# Patient Record
Sex: Male | Born: 1985 | Race: White | Hispanic: No | Marital: Single | Smoking: Former smoker
Health system: Southern US, Community
[De-identification: ages and names within clinical notes are randomized; demographics above are authoritative.]

## PROBLEM LIST (undated history)

## (undated) DIAGNOSIS — Z9221 Personal history of antineoplastic chemotherapy: Secondary | ICD-10-CM

## (undated) DIAGNOSIS — F329 Major depressive disorder, single episode, unspecified: Secondary | ICD-10-CM

## (undated) DIAGNOSIS — Z923 Personal history of irradiation: Secondary | ICD-10-CM

## (undated) DIAGNOSIS — G40909 Epilepsy, unspecified, not intractable, without status epilepticus: Secondary | ICD-10-CM

## (undated) DIAGNOSIS — C719 Malignant neoplasm of brain, unspecified: Secondary | ICD-10-CM

## (undated) HISTORY — DX: Personal history of irradiation: Z92.3

## (undated) HISTORY — DX: Epilepsy, unspecified, not intractable, without status epilepticus: G40.909

## (undated) HISTORY — DX: Malignant neoplasm of brain, unspecified: C71.9

## (undated) HISTORY — DX: Personal history of antineoplastic chemotherapy: Z92.21

## (undated) HISTORY — DX: Major depressive disorder, single episode, unspecified: F32.9

---

## 2010-05-05 ENCOUNTER — Inpatient Hospital Stay (HOSPITAL_COMMUNITY)
Admission: EM | Admit: 2010-05-05 | Discharge: 2010-05-13 | Payer: Self-pay | Source: Home / Self Care | Attending: Internal Medicine | Admitting: Internal Medicine

## 2010-05-09 LAB — RENAL FUNCTION PANEL
Albumin: 3.4 g/dL — ABNORMAL LOW (ref 3.5–5.2)
Albumin: 3 g/dL — ABNORMAL LOW (ref 3.5–5.2)
BUN: 59 mg/dL — ABNORMAL HIGH (ref 6–23)
BUN: 57 mg/dL — ABNORMAL HIGH (ref 6–23)
CO2: 32 mEq/L (ref 19–32)
CO2: 27 mEq/L (ref 19–32)
Calcium: 7.7 mg/dL — ABNORMAL LOW (ref 8.4–10.5)
Calcium: 7.5 mg/dL — ABNORMAL LOW (ref 8.4–10.5)
Chloride: 99 mEq/L (ref 96–112)
Chloride: 100 mEq/L (ref 96–112)
Creatinine, Ser: 7.19 mg/dL — ABNORMAL HIGH (ref 0.4–1.5)
Creatinine, Ser: 6.18 mg/dL — ABNORMAL HIGH (ref 0.4–1.5)
GFR calc Af Amer: 12 mL/min — ABNORMAL LOW (ref >60)
GFR calc Af Amer: 14 mL/min — ABNORMAL LOW (ref >60)
GFR calc non Af Amer: 10 mL/min — ABNORMAL LOW (ref >60)
GFR calc non Af Amer: 11 mL/min — ABNORMAL LOW (ref >60)
Glucose, Bld: 135 mg/dL — ABNORMAL HIGH (ref 70–99)
Glucose, Bld: 137 mg/dL — ABNORMAL HIGH (ref 70–99)
Phosphorus: 6.1 mg/dL — ABNORMAL HIGH (ref 2.3–4.6)
Phosphorus: 4.9 mg/dL — ABNORMAL HIGH (ref 2.3–4.6)
Potassium: 4.4 mEq/L (ref 3.5–5.1)
Potassium: 3.6 mEq/L (ref 3.5–5.1)
Sodium: 140 mEq/L (ref 135–145)
Sodium: 139 mEq/L (ref 135–145)

## 2010-05-09 LAB — CBC
HCT: 59.6 % — ABNORMAL HIGH (ref 39.0–52.0)
HCT: 47 % (ref 39.0–52.0)
HCT: 42.4 % (ref 39.0–52.0)
HCT: 40.3 % (ref 39.0–52.0)
HCT: 40 % (ref 39.0–52.0)
HCT: 28.2 % — ABNORMAL LOW (ref 39.0–52.0)
HCT: 41.4 % (ref 39.0–52.0)
HCT: 43 % (ref 39.0–52.0)
Hemoglobin: 20 g/dL — ABNORMAL HIGH (ref 13.0–17.0)
Hemoglobin: 18.7 g/dL — ABNORMAL HIGH (ref 13.0–17.0)
Hemoglobin: 14.9 g/dL (ref 13.0–17.0)
Hemoglobin: 14.4 g/dL (ref 13.0–17.0)
Hemoglobin: 10.3 g/dL — ABNORMAL LOW (ref 13.0–17.0)
Hemoglobin: 15.4 g/dL (ref 13.0–17.0)
Hemoglobin: 15.8 g/dL (ref 13.0–17.0)
MCH: 32.9 pg (ref 26.0–34.0)
MCH: 32.1 pg (ref 26.0–34.0)
MCH: 31.4 pg (ref 26.0–34.0)
MCH: 31.8 pg (ref 26.0–34.0)
MCH: 32.1 pg (ref 26.0–34.0)
MCH: 32.1 pg (ref 26.0–34.0)
MCHC: 33.6 g/dL (ref 30.0–36.0)
MCHC: 37 g/dL — ABNORMAL HIGH (ref 30.0–36.0)
MCHC: 36 g/dL (ref 30.0–36.0)
MCHC: 36.5 g/dL — ABNORMAL HIGH (ref 30.0–36.0)
MCHC: 37 g/dL — ABNORMAL HIGH (ref 30.0–36.0)
MCHC: 36.7 g/dL — ABNORMAL HIGH (ref 30.0–36.0)
MCV: 98.2 fL (ref 78.0–100.0)
MCV: 86.9 fL (ref 78.0–100.0)
MCV: 87.3 fL (ref 78.0–100.0)
MCV: 87 fL (ref 78.0–100.0)
MCV: 86.3 fL (ref 78.0–100.0)
MCV: 87.4 fL (ref 78.0–100.0)
Platelets: 327 10*3/uL (ref 150–400)
Platelets: 153 10*3/uL (ref 150–400)
Platelets: 146 10*3/uL — ABNORMAL LOW (ref 150–400)
Platelets: 130 10*3/uL — ABNORMAL LOW (ref 150–400)
Platelets: 125 10*3/uL — ABNORMAL LOW (ref 150–400)
Platelets: 101 10*3/uL — ABNORMAL LOW (ref 150–400)
Platelets: 142 10*3/uL — ABNORMAL LOW (ref 150–400)
Platelets: 152 10*3/uL (ref 150–400)
RBC: 6.07 MIL/uL — ABNORMAL HIGH (ref 4.22–5.81)
RBC: 4.64 MIL/uL (ref 4.22–5.81)
RBC: 4.58 MIL/uL (ref 4.22–5.81)
RBC: 3.24 MIL/uL — ABNORMAL LOW (ref 4.22–5.81)
RBC: 4.8 MIL/uL (ref 4.22–5.81)
RBC: 4.92 MIL/uL (ref 4.22–5.81)
RDW: 12.3 % (ref 11.5–15.5)
RDW: 12 % (ref 11.5–15.5)
RDW: 12 % (ref 11.5–15.5)
RDW: 11.8 % (ref 11.5–15.5)
RDW: 12 % (ref 11.5–15.5)
RDW: 12.1 % (ref 11.5–15.5)
WBC: 39 10*3/uL — ABNORMAL HIGH (ref 4.0–10.5)
WBC: 33.7 10*3/uL — ABNORMAL HIGH (ref 4.0–10.5)
WBC: 18.2 10*3/uL — ABNORMAL HIGH (ref 4.0–10.5)
WBC: 16.8 10*3/uL — ABNORMAL HIGH (ref 4.0–10.5)
WBC: 15.8 10*3/uL — ABNORMAL HIGH (ref 4.0–10.5)
WBC: 13.1 10*3/uL — ABNORMAL HIGH (ref 4.0–10.5)
WBC: 19.4 10*3/uL — ABNORMAL HIGH (ref 4.0–10.5)
WBC: 18.6 10*3/uL — ABNORMAL HIGH (ref 4.0–10.5)

## 2010-05-09 LAB — BASIC METABOLIC PANEL
BUN: 17 mg/dL (ref 6–23)
BUN: 40 mg/dL — ABNORMAL HIGH (ref 6–23)
BUN: 42 mg/dL — ABNORMAL HIGH (ref 6–23)
CO2: 21 mEq/L (ref 19–32)
CO2: 20 mEq/L (ref 19–32)
CO2: 17 mEq/L — ABNORMAL LOW (ref 19–32)
Calcium: 9 mg/dL (ref 8.4–10.5)
Calcium: 8.5 mg/dL (ref 8.4–10.5)
Calcium: 8.3 mg/dL — ABNORMAL LOW (ref 8.4–10.5)
Chloride: 103 mEq/L (ref 96–112)
Chloride: 107 mEq/L (ref 96–112)
Chloride: 106 mEq/L (ref 96–112)
Creatinine, Ser: 1.83 mg/dL — ABNORMAL HIGH (ref 0.4–1.5)
Creatinine, Ser: 6.04 mg/dL — ABNORMAL HIGH (ref 0.4–1.5)
Creatinine, Ser: 6.24 mg/dL — ABNORMAL HIGH (ref 0.4–1.5)
GFR calc Af Amer: 56 mL/min — ABNORMAL LOW (ref >60)
GFR calc Af Amer: 14 mL/min — ABNORMAL LOW (ref >60)
GFR calc Af Amer: 14 mL/min — ABNORMAL LOW (ref >60)
GFR calc non Af Amer: 47 mL/min — ABNORMAL LOW (ref >60)
GFR calc non Af Amer: 12 mL/min — ABNORMAL LOW (ref >60)
GFR calc non Af Amer: 11 mL/min — ABNORMAL LOW (ref >60)
Glucose, Bld: 115 mg/dL — ABNORMAL HIGH (ref 70–99)
Glucose, Bld: 142 mg/dL — ABNORMAL HIGH (ref 70–99)
Glucose, Bld: 135 mg/dL — ABNORMAL HIGH (ref 70–99)
Potassium: 3.5 mEq/L (ref 3.5–5.1)
Potassium: 4.9 mEq/L (ref 3.5–5.1)
Potassium: 5 mEq/L (ref 3.5–5.1)
Sodium: 135 mEq/L (ref 135–145)
Sodium: 137 mEq/L (ref 135–145)
Sodium: 133 mEq/L — ABNORMAL LOW (ref 135–145)

## 2010-05-09 LAB — URINE MICROSCOPIC-ADD ON

## 2010-05-09 LAB — COMPREHENSIVE METABOLIC PANEL
ALT: 49 U/L (ref 0–53)
ALT: 44 U/L (ref 0–53)
ALT: 60 U/L — ABNORMAL HIGH (ref 0–53)
ALT: 114 U/L — ABNORMAL HIGH (ref 0–53)
AST: 66 U/L — ABNORMAL HIGH (ref 0–37)
AST: 99 U/L — ABNORMAL HIGH (ref 0–37)
AST: 179 U/L — ABNORMAL HIGH (ref 0–37)
AST: 449 U/L — ABNORMAL HIGH (ref 0–37)
Albumin: 5.2 g/dL (ref 3.5–5.2)
Albumin: 3.5 g/dL (ref 3.5–5.2)
Albumin: 3.4 g/dL — ABNORMAL LOW (ref 3.5–5.2)
Albumin: 3.2 g/dL — ABNORMAL LOW (ref 3.5–5.2)
Alkaline Phosphatase: 130 U/L — ABNORMAL HIGH (ref 39–117)
Alkaline Phosphatase: 60 U/L (ref 39–117)
Alkaline Phosphatase: 57 U/L (ref 39–117)
Alkaline Phosphatase: 55 U/L (ref 39–117)
BUN: 16 mg/dL (ref 6–23)
BUN: 43 mg/dL — ABNORMAL HIGH (ref 6–23)
BUN: 55 mg/dL — ABNORMAL HIGH (ref 6–23)
BUN: 60 mg/dL — ABNORMAL HIGH (ref 6–23)
CO2: 5 mEq/L — CL (ref 19–32)
CO2: 17 mEq/L — ABNORMAL LOW (ref 19–32)
CO2: 23 mEq/L (ref 19–32)
CO2: 25 mEq/L (ref 19–32)
Calcium: 11.5 mg/dL — ABNORMAL HIGH (ref 8.4–10.5)
Calcium: 8.1 mg/dL — ABNORMAL LOW (ref 8.4–10.5)
Calcium: 8 mg/dL — ABNORMAL LOW (ref 8.4–10.5)
Calcium: 7.4 mg/dL — ABNORMAL LOW (ref 8.4–10.5)
Chloride: 100 mEq/L (ref 96–112)
Chloride: 107 mEq/L (ref 96–112)
Chloride: 95 mEq/L — ABNORMAL LOW (ref 96–112)
Chloride: 97 mEq/L (ref 96–112)
Creatinine, Ser: 1.93 mg/dL — ABNORMAL HIGH (ref 0.4–1.5)
Creatinine, Ser: 6.11 mg/dL — ABNORMAL HIGH (ref 0.4–1.5)
Creatinine, Ser: 7.62 mg/dL — ABNORMAL HIGH (ref 0.4–1.5)
Creatinine, Ser: 7.61 mg/dL — ABNORMAL HIGH (ref 0.4–1.5)
GFR calc Af Amer: 53 mL/min — ABNORMAL LOW (ref >60)
GFR calc Af Amer: 14 mL/min — ABNORMAL LOW (ref >60)
GFR calc Af Amer: 11 mL/min — ABNORMAL LOW (ref >60)
GFR calc Af Amer: 11 mL/min — ABNORMAL LOW (ref >60)
GFR calc non Af Amer: 44 mL/min — ABNORMAL LOW (ref >60)
GFR calc non Af Amer: 12 mL/min — ABNORMAL LOW (ref >60)
GFR calc non Af Amer: 9 mL/min — ABNORMAL LOW (ref >60)
GFR calc non Af Amer: 9 mL/min — ABNORMAL LOW (ref >60)
Glucose, Bld: 190 mg/dL — ABNORMAL HIGH (ref 70–99)
Glucose, Bld: 148 mg/dL — ABNORMAL HIGH (ref 70–99)
Glucose, Bld: 144 mg/dL — ABNORMAL HIGH (ref 70–99)
Glucose, Bld: 155 mg/dL — ABNORMAL HIGH (ref 70–99)
Potassium: 4.1 mEq/L (ref 3.5–5.1)
Potassium: 4.6 mEq/L (ref 3.5–5.1)
Potassium: 3.9 mEq/L (ref 3.5–5.1)
Potassium: 3.6 mEq/L (ref 3.5–5.1)
Sodium: 146 mEq/L — ABNORMAL HIGH (ref 135–145)
Sodium: 134 mEq/L — ABNORMAL LOW (ref 135–145)
Sodium: 133 mEq/L — ABNORMAL LOW (ref 135–145)
Sodium: 137 mEq/L (ref 135–145)
Total Bilirubin: 0.6 mg/dL (ref 0.3–1.2)
Total Bilirubin: 0.8 mg/dL (ref 0.3–1.2)
Total Bilirubin: 0.8 mg/dL (ref 0.3–1.2)
Total Bilirubin: 1.1 mg/dL (ref 0.3–1.2)
Total Protein: 8.4 g/dL — ABNORMAL HIGH (ref 6.0–8.3)
Total Protein: 5.7 g/dL — ABNORMAL LOW (ref 6.0–8.3)
Total Protein: 5.5 g/dL — ABNORMAL LOW (ref 6.0–8.3)
Total Protein: 5.5 g/dL — ABNORMAL LOW (ref 6.0–8.3)

## 2010-05-09 LAB — URINALYSIS, ROUTINE W REFLEX MICROSCOPIC
Bilirubin Urine: NEGATIVE
Bilirubin Urine: NEGATIVE
Ketones, ur: NEGATIVE mg/dL
Ketones, ur: NEGATIVE mg/dL
Leukocytes, UA: NEGATIVE
Leukocytes, UA: NEGATIVE
Nitrite: NEGATIVE
Nitrite: NEGATIVE
Protein, ur: 100 mg/dL — AB
Protein, ur: 100 mg/dL — AB
Specific Gravity, Urine: 1.018 (ref 1.005–1.030)
Specific Gravity, Urine: 1.011 (ref 1.005–1.030)
Urine Glucose, Fasting: NEGATIVE mg/dL
Urine Glucose, Fasting: NEGATIVE mg/dL
Urobilinogen, UA: 0.2 mg/dL (ref 0.0–1.0)
Urobilinogen, UA: 0.2 mg/dL (ref 0.0–1.0)
pH: 5.5 (ref 5.0–8.0)
pH: 5.5 (ref 5.0–8.0)

## 2010-05-09 LAB — PROTEIN / CREATININE RATIO, URINE
Creatinine, Urine: 67.5 mg/dL
Protein Creatinine Ratio: 0.9 — ABNORMAL HIGH (ref 0.00–0.15)
Total Protein, Urine: 61 mg/dL

## 2010-05-09 LAB — CK
Total CK: 10104 U/L — ABNORMAL HIGH (ref 7–232)
Total CK: 30317 U/L — ABNORMAL HIGH (ref 7–232)
Total CK: 92674 U/L — ABNORMAL HIGH (ref 7–232)
Total CK: >50000 U/L — ABNORMAL HIGH (ref 7–232)
Total CK: >50000 U/L — ABNORMAL HIGH (ref 7–232)

## 2010-05-09 LAB — DIFFERENTIAL
Band Neutrophils: 0 % (ref 0–10)
Basophils Absolute: 0 10*3/uL (ref 0.0–0.1)
Basophils Absolute: 0 10*3/uL (ref 0.0–0.1)
Basophils Relative: 0 % (ref 0–1)
Basophils Relative: 0 % (ref 0–1)
Blasts: 0 %
Eosinophils Absolute: 0 10*3/uL (ref 0.0–0.7)
Eosinophils Absolute: 0 10*3/uL (ref 0.0–0.7)
Eosinophils Relative: 0 % (ref 0–5)
Eosinophils Relative: 0 % (ref 0–5)
Lymphocytes Relative: 4 % — ABNORMAL LOW (ref 12–46)
Lymphocytes Relative: 3 % — ABNORMAL LOW (ref 12–46)
Lymphs Abs: 1.3 10*3/uL (ref 0.7–4.0)
Lymphs Abs: 0.5 10*3/uL — ABNORMAL LOW (ref 0.7–4.0)
Metamyelocytes Relative: 0 %
Monocytes Absolute: 1 10*3/uL (ref 0.1–1.0)
Monocytes Absolute: 0.5 10*3/uL (ref 0.1–1.0)
Monocytes Relative: 3 % (ref 3–12)
Monocytes Relative: 3 % (ref 3–12)
Myelocytes: 0 %
Neutro Abs: 31.4 10*3/uL — ABNORMAL HIGH (ref 1.7–7.7)
Neutro Abs: 17.2 10*3/uL — ABNORMAL HIGH (ref 1.7–7.7)
Neutrophils Relative %: 93 % — ABNORMAL HIGH (ref 43–77)
Neutrophils Relative %: 94 % — ABNORMAL HIGH (ref 43–77)
Promyelocytes Absolute: 0 %
nRBC: 0 /100 WBC

## 2010-05-09 LAB — DIRECT ANTIGLOBULIN TEST (NOT AT ARMC)
DAT, IgG: NEGATIVE
DAT, complement: NEGATIVE

## 2010-05-09 LAB — HEPATIC FUNCTION PANEL
ALT: 229 U/L — ABNORMAL HIGH (ref 0–53)
AST: 757 U/L — ABNORMAL HIGH (ref 0–37)
Albumin: 3 g/dL — ABNORMAL LOW (ref 3.5–5.2)
Alkaline Phosphatase: 53 U/L (ref 39–117)
Bilirubin, Direct: 0.2 mg/dL (ref 0.0–0.3)
Indirect Bilirubin: 0.7 mg/dL (ref 0.3–0.9)
Total Bilirubin: 0.9 mg/dL (ref 0.3–1.2)
Total Protein: 5.1 g/dL — ABNORMAL LOW (ref 6.0–8.3)

## 2010-05-09 LAB — HIGH SENSITIVITY CRP: CRP, High Sensitivity: 98 mg/L — ABNORMAL HIGH

## 2010-05-09 LAB — URINE CULTURE
Colony Count: NO GROWTH
Culture  Setup Time: 201201120648
Culture: NO GROWTH

## 2010-05-09 LAB — SODIUM, URINE, RANDOM: Sodium, Ur: 17 mEq/L

## 2010-05-09 LAB — HIV ANTIBODY (ROUTINE TESTING W REFLEX): HIV: NONREACTIVE

## 2010-05-09 LAB — MAGNESIUM
Magnesium: 3.8 mg/dL — ABNORMAL HIGH (ref 1.5–2.5)
Magnesium: 2.9 mg/dL — ABNORMAL HIGH (ref 1.5–2.5)

## 2010-05-09 LAB — LACTATE DEHYDROGENASE: LDH: 1584 U/L — ABNORMAL HIGH (ref 94–250)

## 2010-05-09 LAB — SEDIMENTATION RATE: Sed Rate: 4 mm/hr (ref 0–16)

## 2010-05-09 LAB — RAPID URINE DRUG SCREEN, HOSP PERFORMED
Amphetamines: NOT DETECTED
Barbiturates: NOT DETECTED
Benzodiazepines: NOT DETECTED
Cocaine: NOT DETECTED
Opiates: NOT DETECTED
Tetrahydrocannabinol: POSITIVE — AB

## 2010-05-09 LAB — GLUCOSE, CAPILLARY: Glucose-Capillary: 213 mg/dL — ABNORMAL HIGH (ref 70–99)

## 2010-05-09 LAB — PHOSPHORUS
Phosphorus: 7.3 mg/dL — ABNORMAL HIGH (ref 2.3–4.6)
Phosphorus: 7.5 mg/dL — ABNORMAL HIGH (ref 2.3–4.6)
Phosphorus: 6.4 mg/dL — ABNORMAL HIGH (ref 2.3–4.6)

## 2010-05-09 LAB — MRSA PCR SCREENING: MRSA by PCR: NEGATIVE

## 2010-05-09 LAB — RETICULOCYTES
RBC.: 4.42 MIL/uL (ref 4.22–5.81)
Retic Count, Absolute: 30.9 10*3/uL (ref 19.0–186.0)
Retic Ct Pct: 0.7 % (ref 0.4–3.1)

## 2010-05-09 LAB — HAPTOGLOBIN: Haptoglobin: 114 mg/dL (ref 16–200)

## 2010-05-09 LAB — CREATININE, URINE, RANDOM: Creatinine, Urine: 67.3 mg/dL

## 2010-05-09 LAB — ETHANOL: Alcohol, Ethyl (B): <5 mg/dL (ref 0–10)

## 2010-05-09 LAB — PROTEIN, URINE, RANDOM: Total Protein, Urine: 60 mg/dL

## 2010-05-10 ENCOUNTER — Encounter (INDEPENDENT_AMBULATORY_CARE_PROVIDER_SITE_OTHER): Payer: Self-pay | Admitting: Internal Medicine

## 2010-05-11 LAB — COMPREHENSIVE METABOLIC PANEL
ALT: 242 U/L — ABNORMAL HIGH (ref 0–53)
ALT: 235 U/L — ABNORMAL HIGH (ref 0–53)
AST: 700 U/L — ABNORMAL HIGH (ref 0–37)
AST: 578 U/L — ABNORMAL HIGH (ref 0–37)
Albumin: 2.9 g/dL — ABNORMAL LOW (ref 3.5–5.2)
Albumin: 2.7 g/dL — ABNORMAL LOW (ref 3.5–5.2)
Alkaline Phosphatase: 54 U/L (ref 39–117)
Alkaline Phosphatase: 44 U/L (ref 39–117)
BUN: 51 mg/dL — ABNORMAL HIGH (ref 6–23)
BUN: 40 mg/dL — ABNORMAL HIGH (ref 6–23)
CO2: 24 mEq/L (ref 19–32)
CO2: 26 mEq/L (ref 19–32)
Calcium: 7.6 mg/dL — ABNORMAL LOW (ref 8.4–10.5)
Calcium: 8.2 mg/dL — ABNORMAL LOW (ref 8.4–10.5)
Chloride: 103 mEq/L (ref 96–112)
Chloride: 108 mEq/L (ref 96–112)
Creatinine, Ser: 5.23 mg/dL — ABNORMAL HIGH (ref 0.4–1.5)
Creatinine, Ser: 3.6 mg/dL — ABNORMAL HIGH (ref 0.4–1.5)
GFR calc Af Amer: 17 mL/min — ABNORMAL LOW (ref >60)
GFR calc Af Amer: 26 mL/min — ABNORMAL LOW (ref >60)
GFR calc non Af Amer: 14 mL/min — ABNORMAL LOW (ref >60)
GFR calc non Af Amer: 21 mL/min — ABNORMAL LOW (ref >60)
Glucose, Bld: 107 mg/dL — ABNORMAL HIGH (ref 70–99)
Glucose, Bld: 103 mg/dL — ABNORMAL HIGH (ref 70–99)
Potassium: 4 mEq/L (ref 3.5–5.1)
Potassium: 3.6 mEq/L (ref 3.5–5.1)
Sodium: 136 mEq/L (ref 135–145)
Sodium: 142 mEq/L (ref 135–145)
Total Bilirubin: 1 mg/dL (ref 0.3–1.2)
Total Bilirubin: 1.2 mg/dL (ref 0.3–1.2)
Total Protein: 4.8 g/dL — ABNORMAL LOW (ref 6.0–8.3)
Total Protein: 4.6 g/dL — ABNORMAL LOW (ref 6.0–8.3)

## 2010-05-11 LAB — CK
Total CK: >50000 U/L — ABNORMAL HIGH (ref 7–232)
Total CK: 51091 U/L — ABNORMAL HIGH (ref 7–232)
Total CK: >50000 U/L — ABNORMAL HIGH (ref 7–232)

## 2010-05-11 LAB — BASIC METABOLIC PANEL
BUN: 36 mg/dL — ABNORMAL HIGH (ref 6–23)
CO2: 23 mEq/L (ref 19–32)
Calcium: 8.5 mg/dL (ref 8.4–10.5)
Chloride: 108 mEq/L (ref 96–112)
Creatinine, Ser: 3.18 mg/dL — ABNORMAL HIGH (ref 0.4–1.5)
GFR calc Af Amer: 30 mL/min — ABNORMAL LOW (ref >60)
GFR calc non Af Amer: 25 mL/min — ABNORMAL LOW (ref >60)
Glucose, Bld: 123 mg/dL — ABNORMAL HIGH (ref 70–99)
Potassium: 3.4 mEq/L — ABNORMAL LOW (ref 3.5–5.1)
Sodium: 141 mEq/L (ref 135–145)

## 2010-05-11 LAB — CBC
HCT: 41.7 % (ref 39.0–52.0)
HCT: 40 % (ref 39.0–52.0)
Hemoglobin: 15.2 g/dL (ref 13.0–17.0)
Hemoglobin: 14.5 g/dL (ref 13.0–17.0)
MCH: 31.9 pg (ref 26.0–34.0)
MCH: 31.4 pg (ref 26.0–34.0)
MCHC: 36.5 g/dL — ABNORMAL HIGH (ref 30.0–36.0)
MCHC: 36.3 g/dL — ABNORMAL HIGH (ref 30.0–36.0)
MCV: 87.4 fL (ref 78.0–100.0)
MCV: 86.6 fL (ref 78.0–100.0)
Platelets: 155 10*3/uL (ref 150–400)
Platelets: 142 10*3/uL — ABNORMAL LOW (ref 150–400)
RBC: 4.77 MIL/uL (ref 4.22–5.81)
RBC: 4.62 MIL/uL (ref 4.22–5.81)
RDW: 11.9 % (ref 11.5–15.5)
RDW: 11.8 % (ref 11.5–15.5)
WBC: 15.2 10*3/uL — ABNORMAL HIGH (ref 4.0–10.5)
WBC: 15 10*3/uL — ABNORMAL HIGH (ref 4.0–10.5)

## 2010-05-11 LAB — GIARDIA/CRYPTOSPORIDIUM SCREEN(EIA)
Cryptosporidium Screen (EIA): NEGATIVE
Giardia Screen - EIA: NEGATIVE

## 2010-05-11 LAB — PHOSPHORUS
Phosphorus: 4.6 mg/dL (ref 2.3–4.6)
Phosphorus: 3.9 mg/dL (ref 2.3–4.6)
Phosphorus: 3.6 mg/dL (ref 2.3–4.6)

## 2010-05-11 LAB — HEPATITIS B SURFACE ANTIBODY,QUALITATIVE: Hep B S Ab: POSITIVE — AB

## 2010-05-11 LAB — LEUKOCYTE ALKALINE PHOSPHATASE: Leukocyte Alkaline  Phos Stain: 233 — ABNORMAL HIGH (ref 30–140)

## 2010-05-12 ENCOUNTER — Encounter (INDEPENDENT_AMBULATORY_CARE_PROVIDER_SITE_OTHER): Payer: Self-pay | Admitting: Internal Medicine

## 2010-05-12 HISTORY — PX: CRANIOTOMY FOR STEREOTACTIC GUIDED SURGERY: SUR339

## 2010-05-16 LAB — CBC
HCT: 39.4 % (ref 39.0–52.0)
HCT: 40.2 % (ref 39.0–52.0)
HCT: 38.2 % — ABNORMAL LOW (ref 39.0–52.0)
Hemoglobin: 14 g/dL (ref 13.0–17.0)
Hemoglobin: 14.5 g/dL (ref 13.0–17.0)
Hemoglobin: 13.6 g/dL (ref 13.0–17.0)
MCH: 31.1 pg (ref 26.0–34.0)
MCH: 31.5 pg (ref 26.0–34.0)
MCH: 31.3 pg (ref 26.0–34.0)
MCHC: 35.5 g/dL (ref 30.0–36.0)
MCHC: 36.1 g/dL — ABNORMAL HIGH (ref 30.0–36.0)
MCHC: 35.6 g/dL (ref 30.0–36.0)
MCV: 87.6 fL (ref 78.0–100.0)
MCV: 87.2 fL (ref 78.0–100.0)
MCV: 88 fL (ref 78.0–100.0)
Platelets: 126 10*3/uL — ABNORMAL LOW (ref 150–400)
Platelets: 124 10*3/uL — ABNORMAL LOW (ref 150–400)
Platelets: 135 10*3/uL — ABNORMAL LOW (ref 150–400)
RBC: 4.5 MIL/uL (ref 4.22–5.81)
RBC: 4.61 MIL/uL (ref 4.22–5.81)
RBC: 4.34 MIL/uL (ref 4.22–5.81)
RDW: 11.8 % (ref 11.5–15.5)
RDW: 11.8 % (ref 11.5–15.5)
RDW: 11.8 % (ref 11.5–15.5)
WBC: 14.6 10*3/uL — ABNORMAL HIGH (ref 4.0–10.5)
WBC: 16.1 10*3/uL — ABNORMAL HIGH (ref 4.0–10.5)
WBC: 17.3 10*3/uL — ABNORMAL HIGH (ref 4.0–10.5)

## 2010-05-16 LAB — COMPREHENSIVE METABOLIC PANEL
ALT: 265 U/L — ABNORMAL HIGH (ref 0–53)
ALT: 425 U/L — ABNORMAL HIGH (ref 0–53)
AST: 416 U/L — ABNORMAL HIGH (ref 0–37)
AST: 357 U/L — ABNORMAL HIGH (ref 0–37)
Albumin: 2.7 g/dL — ABNORMAL LOW (ref 3.5–5.2)
Albumin: 3 g/dL — ABNORMAL LOW (ref 3.5–5.2)
Alkaline Phosphatase: 39 U/L (ref 39–117)
Alkaline Phosphatase: 51 U/L (ref 39–117)
BUN: 33 mg/dL — ABNORMAL HIGH (ref 6–23)
BUN: 27 mg/dL — ABNORMAL HIGH (ref 6–23)
CO2: 25 mEq/L (ref 19–32)
CO2: 27 mEq/L (ref 19–32)
Calcium: 8.2 mg/dL — ABNORMAL LOW (ref 8.4–10.5)
Calcium: 8.7 mg/dL (ref 8.4–10.5)
Chloride: 108 mEq/L (ref 96–112)
Chloride: 107 mEq/L (ref 96–112)
Creatinine, Ser: 2.87 mg/dL — ABNORMAL HIGH (ref 0.4–1.5)
Creatinine, Ser: 2.27 mg/dL — ABNORMAL HIGH (ref 0.4–1.5)
GFR calc Af Amer: 33 mL/min — ABNORMAL LOW (ref >60)
GFR calc Af Amer: 44 mL/min — ABNORMAL LOW (ref >60)
GFR calc non Af Amer: 28 mL/min — ABNORMAL LOW (ref >60)
GFR calc non Af Amer: 36 mL/min — ABNORMAL LOW (ref >60)
Glucose, Bld: 113 mg/dL — ABNORMAL HIGH (ref 70–99)
Glucose, Bld: 117 mg/dL — ABNORMAL HIGH (ref 70–99)
Potassium: 4.4 mEq/L (ref 3.5–5.1)
Potassium: 3.7 mEq/L (ref 3.5–5.1)
Sodium: 140 mEq/L (ref 135–145)
Sodium: 140 mEq/L (ref 135–145)
Total Bilirubin: 0.7 mg/dL (ref 0.3–1.2)
Total Bilirubin: 1 mg/dL (ref 0.3–1.2)
Total Protein: 4.6 g/dL — ABNORMAL LOW (ref 6.0–8.3)
Total Protein: 5.4 g/dL — ABNORMAL LOW (ref 6.0–8.3)

## 2010-05-16 LAB — GRAM STAIN

## 2010-05-16 LAB — CULTURE, BLOOD (ROUTINE X 2)
Culture  Setup Time: 201201121034
Culture: NO GROWTH

## 2010-05-16 LAB — PROTIME-INR
INR: 1.07 (ref 0.00–1.49)
Prothrombin Time: 14.1 seconds (ref 11.6–15.2)

## 2010-05-16 LAB — HEPATITIS B CORE ANTIBODY, TOTAL: Hep B Core Total Ab: NEGATIVE

## 2010-05-16 LAB — RENAL FUNCTION PANEL
Albumin: 2.8 g/dL — ABNORMAL LOW (ref 3.5–5.2)
BUN: 26 mg/dL — ABNORMAL HIGH (ref 6–23)
CO2: 26 mEq/L (ref 19–32)
Calcium: 8.3 mg/dL — ABNORMAL LOW (ref 8.4–10.5)
Chloride: 108 mEq/L (ref 96–112)
Creatinine, Ser: 1.98 mg/dL — ABNORMAL HIGH (ref 0.4–1.5)
GFR calc Af Amer: 51 mL/min — ABNORMAL LOW (ref >60)
GFR calc non Af Amer: 42 mL/min — ABNORMAL LOW (ref >60)
Glucose, Bld: 166 mg/dL — ABNORMAL HIGH (ref 70–99)
Phosphorus: 3.1 mg/dL (ref 2.3–4.6)
Potassium: 4.2 mEq/L (ref 3.5–5.1)
Sodium: 139 mEq/L (ref 135–145)

## 2010-05-16 LAB — CK: Total CK: 21579 U/L — ABNORMAL HIGH (ref 7–232)

## 2010-05-16 LAB — TYPE AND SCREEN
ABO/RH(D): A POS
Antibody Screen: NEGATIVE

## 2010-05-16 LAB — MAGNESIUM: Magnesium: 1.7 mg/dL (ref 1.5–2.5)

## 2010-05-16 LAB — HEPATITIS PANEL, ACUTE
HCV Ab: NEGATIVE
Hep A IgM: NEGATIVE
Hep B C IgM: NEGATIVE
Hepatitis B Surface Ag: NEGATIVE

## 2010-05-16 LAB — APTT: aPTT: 28 seconds (ref 24–37)

## 2010-05-16 LAB — MRSA PCR SCREENING: MRSA by PCR: NEGATIVE

## 2010-05-16 LAB — ABO/RH: ABO/RH(D): A POS

## 2010-05-17 LAB — TISSUE CULTURE: Culture: NO GROWTH

## 2010-05-20 NOTE — H&P (Signed)
NAMEJESSI, JESSOP                 ACCOUNT NO.:  [id number]  MEDICAL RECORD NO.:  [id number]          PATIENT TYPE:  EMS  LOCATION:  MAJO                         FACILITY:  [facility]  PHYSICIAN:  Houston Siren, MD           DATE OF BIRTH:  [birth year]  DATE OF ADMISSION:  05/05/2010 DATE OF DISCHARGE:                             HISTORY & PHYSICAL   PRIMARY CARE PHYSICIAN:  Unassigned.  REASON FOR ADMISSION:  New onset of seizure.  ADVANCE DIRECTIVE:  Full code.  HISTORY OF PRESENT ILLNESS:  This is a 25 year old male with benign past medical history on no chronic medication, presents to the emergency room with three episodes of brief but generalized tonic-clonic seizure.  He had bladder incontinence with this and had postictal confusion.  He was loaded with fosphenytoin and had no further seizure.  When I saw him, he was alert and oriented, able to carry on meaningful conversation.  He had mild headache but no visual problem.  He denies any fever, chills or any pulmonary symptoms.  He has no stiff neck, nausea or vomiting, sore throat or any other symptomatology.  Workup included a head CT with no contrast demonstrating a round hypodense lesion in the occipital lobe. Urine drug screen only show THC.  He is afebrile, but did have a white count of 39,000.  Pertinent history is that last summer, he traveled to [place] and stayed for 10 days and ate local food there.  He has no family history of brain tumor or seizure.  Hospitalist was asked to admit the patient for further evaluation of this new onset of seizure.  PAST MEDICAL HISTORY:  Benign.  MEDICATIONS:  On no chronic meds.  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  He is not married and has no children.  He has no increased risk for HIV.  He used marijuana but denied any drug use.  He is not a smoker.  Denies any excessive alcohol use.  FAMILY HISTORY:  The patient has brothers and sisters, none of whom  had seizure or brain tumor.  REVIEW OF SYSTEMS:  Otherwise unremarkable.  PHYSICAL EXAMINATION:  VITAL SIGNS:  Blood pressure 140/60, pulse of 120, respiratory rate of 18, temperature 97.7. GENERAL:  He is alert and oriented and is in no apparent distress. HEENT:  His extraocular muscles are intact.  Pupils are round, reactive to light and accommodation.  His visual field is full by confrontation. He has facial symmetry and his speech is fluent.  Tongue is midline. Uvula elevated with phonation. NECK:  Supple.  Throat is clear. CARDIAC:  S1 and S2 regular.  I did not hear any murmur, rub or gallop. LUNGS:  Clear.  He had no cervical adenopathy or supraclavicular adenopathy. ABDOMEN:  Soft, nondistended, nontender. EXTREMITIES:  No edema.  There is no rash or joint pain.  Babinski is down bilaterally.  His strength is equal bilaterally as well. Cerebellar exam is intact. SKIN:  Warm and dry. PSYCHIATRIC:  Unremarkable as well.  OBJECTIVE FINDINGS:  CT of the head without contrast  show occipital mass versus infarction.  Suggested MRI.  Alcohol level less than 5.  White count of 39,000 with hemoglobin of 20.  His serum sodium is 146, creatinine of 1.93, BUN of 16.  Chest x-ray is clear.  IMPRESSION:  This is a 25 year old male previously healthy presenting with three episodes of seizure and head CT without contrast showed round hypodense in the occipital lobe.  He also has elevated white count to 39,000 and hemoglobin of 20.  He has elevated creatinine as well.  The differential here includes brain abscess, cysticercosis, lymphoma, glioma and even tuberculosis.  We will admit him to the telemetry unit and will continue the Dilantin 100 mg p.o. t.i.d.  We will obtain MRI of the brain with and without contrast.  I would like to repeat the CBCs to confirm that he has leukocytosis and elevated hemoglobin.  Neurology has already been consulted and will likely need neurological consult  as well.  Please call neurosurgical consult after MRI of the brain is done. He is quite stable now, and we will watch him closely.  W'll give some IV fluids to see if his elevated creatinine would improve.  He is a full code, will be admitted to triad hospitalist.     Houston Siren, MD     PL/MEDQ  D:  05/05/2010  T:  05/05/2010  Job:  [id number]  Electronically Signed by Houston Siren  on 05/18/2010 12:50:27 AM

## 2010-05-26 DIAGNOSIS — C714 Malignant neoplasm of occipital lobe: Secondary | ICD-10-CM | POA: Insufficient documentation

## 2010-05-26 HISTORY — PX: CRANIECTOMY / CRANIOTOMY FOR EXCISION OF BRAIN TUMOR: SUR320

## 2010-06-01 NOTE — Discharge Summary (Signed)
  [name], Robert Watkins                 ACCOUNT NO.:  [id number]  MEDICAL RECORD NO.:  [id number]          PATIENT TYPE:  INP  LOCATION:  3108                         FACILITY:  [facility]  PHYSICIAN:  Jeoffrey Massed, MD    DATE OF BIRTH:  11-27-1985  DATE OF ADMISSION:  05/05/2010 DATE OF DISCHARGE:  05/13/2010                        DISCHARGE SUMMARY - REFERRING   ADDENDUM: The patient's discharge medications are as follows: 1. Amlodipine 2.5 mg p.o. daily. 2. Decadron taper as prescribed by Dr. Danielle Dess. 3. Keppra 500 mg one tablet p.o. b.i.d.  I did have a chance discuss with the patient's current neurosurgeon, Dr. Danielle Dess.  He will follow the pathology results and will then call the patient for a follow-up appointment.  As indicated before, the patient's father will try and schedule this patient to see Dr. Danise Edge in the next few weeks or so.  For further details, please see the discharge summary that was dictated by me before.   Jeoffrey Massed, MD     SG/MEDQ  D:  05/13/2010  T:  05/13/2010  Job:  [id number]  cc:   Stefani Dama, M.D. Danise Edge, Dr.  Electronically Signed by Jeoffrey Massed  on 06/01/2010 04:09:08 PM

## 2010-06-01 NOTE — Discharge Summary (Signed)
Robert Watkins, Robert Watkins                 ACCOUNT NO.:  [id number]  MEDICAL RECORD NO.:  [id number]          PATIENT TYPE:  INP  LOCATION:  3108                         FACILITY:  [facility]  PHYSICIAN:  Jeoffrey Massed, MD    DATE OF BIRTH:  16-Aug-1985  DATE OF ADMISSION:  05/05/2010 DATE OF DISCHARGE:                        DISCHARGE SUMMARY - REFERRING   PRIMARY DISCHARGE DIAGNOSES: 1. Left occipital region mass.  Biopsies currently pending. 2. Generalized tonic clonic seizures, probably secondary to above. 3. Leukocytosis of uncertain etiology, now resolving. 4. Acute renal failure, resolving. 5. Rhabdomyolysis secondary to seizures. 6. Elevated blood pressure without a diagnosis of hypertension. 7. Left upper extremity hematoma, resolving. 8. Thrombocytopenia, resolving.  CONSULTANTS: 1. Dr. Danielle Dess from neurosurgery. 2. Dr. Caryn Section from [facility]. 3. [facility].  BRIEF HISTORY OF PRESENT ILLNESS:  The patient is a very pleasant 25- year-old man with no significant past medical history who presented to the emergency room on October 04, 2010 with new onset,  3 episodes of generalized tonic clonic seizure.  He was found to have a white count of 39,000.  A workup in the ED had demonstrated a round hypodense lesion in the left occipital lobe seen on the CAT scan of the head.  He was then admitted to the hospitalist service for further evaluation and treatment.  For further details, please see the history and physical that was dictated by Dr. Conley Rolls on admission.  PERTINENT LABORATORY DATA: 1. The preliminary culture of the brain tissue shows moderate WBC with     both PMN and mononuclear cells with no organisms seen so far. 2. Admitting creatinine was 1.93 with a bicarb of 5. 3. The peak CPK was 92,674.  His peak creatinine was 7.19. 4. Last creatinine done early this morning was 1.98. 5. Acute hepatitis serology was negative. 6. Leukocyte alkaline phosphatase stain was 233. 7.  ESR was 4. 8. HIV antibody was nonreactive.  RADIOLOGICAL STUDIES: 1. X-ray of the chest done on May 05, 2010 showed no acute     cardiopulmonary findings. 2. CT of the head without contrast showed occipital mass with     infarction. 3. MRI of the brain with and without contrast showed a 3.5-cm x 4.5-cm     mass in the left occipital lobe with restricted effusion and     minimal central enhancement.  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  A cerebral abscess is less likely a     consideration. 4. Renal ultrasound showed normal sonographic appearance of both     kidneys. 5. MRI of the head with stealth protocol done on 17th [name] 1012     showed enlargement of enlargement of the region of signal     disturbance in the left parieto-occipital region with enlargement     of the central necrotic/liquified area.  I think the changes favor     inflammatory disease, though rapidly progressive neoplastic disease     is also considered.  PROCEDURES PERFORMED:  The patient underwent a left occipital frameless stereotactic brain biopsy on May 12, 2009.  BRIEF HOSPITAL  COURSE: 1. Seizure disorder.  This is secondary to the brain mass.  The     patient was seen in consult with both Neurology and Neurosurgery.     Currently the patient has been maintained on Keppra with good     results.  He was also started on Decadron which is gradually being     tapered. 2. Left occipital mass.  This is of unknown etiology.  Although the     initial MRI of the brain was suggestive of a low-grade glioma, a     repeat MRI of the brain with stealth protocol is now suggestive of     a possible inflammatory process given the rapid enlargement of the     mass.  The patient did undergo a left occipital frameless     stereotactic brain biopsy yesterday, the pathology of which is     still pending.  The Gram stain is showing moderate WBCs with both     polymorphonuclear cells and  mononuclear organisms; however, no     organisms were seen.  The patient has remained persistently     afebrile.  He is currently not on any antibiotics.  Further plan     will depend upon the results of the biopsy.  Cultures of the     biopsies are also pending and will need followup as well. 3. Acute renal failure.  This is secondary to rhabdomyolysis.  The     etiology of the rhabdomyolysis was secondary to seizures.  Peak     creatinine at one point was more than 7.  The patient was seen in     consult with Nephrology and was hydrated aggressively and even a     bicarbonate infusion was started.  His CPK has trended down slowly.     With hydration and with control of his seizure activity, his     creatinine has also started to come down and last creatinine done     early this morning is 1.98.  It is anticipated further decrease in     his creatinine and subsequent normalizations will occur in the next     few days. 4. Elevated blood pressure without a diagnosis of hypertension.  The     elevated blood pressure was probably a result of IV fluids and     renal failure.  Initially he was both on hydralazine and     amlodipine; however, the hydralazine has now been discontinued.     Amlodipine has now been decreased to 2.5 mg daily.  He will need     follow up with a primary care practitioner for further optimization     and perhaps even discontinuation of all of his antihypertensive     medications once his kidney function resolves.  DISCHARGE MEDICATIONS:  Discharge medications will be dictated at the time of discharge by the discharging physician.  DISPOSITION:  The patient will be discharged home when medically stable.  FOLLOWUP INSTRUCTIONS: 1. The patient's pathology from the brain biopsy is currently pending     and will need followup. 2. Brain tissue from his brain biopsy have also been sent for Gram     stain.  Culture of his tissue along with fungal and AFB are also      pending and will need to be followed up as well. 3. I have talked in detail with the patient's father at bedside.  He     plans to make an appointment  with Dr. Danise Edge who is the     father's primary care practitioner in the next week or so. 4. The patient will require followup with neurosurgical service as     advised by Neurosurgery upon discharge.     Jeoffrey Massed, MD     SG/MEDQ  D:  05/13/2010  T:  05/13/2010  Job:  [id number]  cc:   Danise Edge, M.D. Stefani Dama, M.D.  Electronically Signed by Jeoffrey Massed  on 06/01/2010 04:08:53 PM

## 2010-06-09 ENCOUNTER — Ambulatory Visit: Payer: BC Managed Care – PPO | Attending: Radiation Oncology | Admitting: Radiation Oncology

## 2010-06-09 DIAGNOSIS — Z510 Encounter for antineoplastic radiation therapy: Secondary | ICD-10-CM | POA: Insufficient documentation

## 2010-06-09 DIAGNOSIS — C714 Malignant neoplasm of occipital lobe: Secondary | ICD-10-CM | POA: Insufficient documentation

## 2010-06-09 DIAGNOSIS — Z79899 Other long term (current) drug therapy: Secondary | ICD-10-CM | POA: Insufficient documentation

## 2010-06-10 ENCOUNTER — Other Ambulatory Visit: Payer: Self-pay | Admitting: Oncology

## 2010-06-10 ENCOUNTER — Encounter (HOSPITAL_BASED_OUTPATIENT_CLINIC_OR_DEPARTMENT_OTHER): Payer: BC Managed Care – PPO | Admitting: Oncology

## 2010-06-10 ENCOUNTER — Encounter: Payer: BC Managed Care – PPO | Admitting: Oncology

## 2010-06-10 DIAGNOSIS — C713 Malignant neoplasm of parietal lobe: Secondary | ICD-10-CM

## 2010-06-10 LAB — FUNGUS CULTURE W SMEAR: Fungal Smear: NONE SEEN

## 2010-06-10 LAB — COMPREHENSIVE METABOLIC PANEL
ALT: 72 U/L — ABNORMAL HIGH (ref 0–53)
AST: 21 U/L (ref 0–37)
Albumin: 3.8 g/dL (ref 3.5–5.2)
Alkaline Phosphatase: 58 U/L (ref 39–117)
BUN: 22 mg/dL (ref 6–23)
CO2: 22 mEq/L (ref 19–32)
Calcium: 8.3 mg/dL — ABNORMAL LOW (ref 8.4–10.5)
Chloride: 105 mEq/L (ref 96–112)
Creatinine, Ser: 0.73 mg/dL (ref 0.40–1.50)
Glucose, Bld: 94 mg/dL (ref 70–99)
Potassium: 4 mEq/L (ref 3.5–5.3)
Sodium: 138 mEq/L (ref 135–145)
Total Bilirubin: 0.5 mg/dL (ref 0.3–1.2)
Total Protein: 5.6 g/dL — ABNORMAL LOW (ref 6.0–8.3)

## 2010-06-10 LAB — CBC WITH DIFFERENTIAL/PLATELET
BASO%: 0 % (ref 0.0–2.0)
Basophils Absolute: 0 10*3/uL (ref 0.0–0.1)
EOS%: 0.3 % (ref 0.0–7.0)
Eosinophils Absolute: 0 10*3/uL (ref 0.0–0.5)
HCT: 37.2 % — ABNORMAL LOW (ref 38.4–49.9)
HGB: 13.1 g/dL (ref 13.0–17.1)
LYMPH%: 5 % — ABNORMAL LOW (ref 14.0–49.0)
MCH: 32.4 pg (ref 27.2–33.4)
MCHC: 35.1 g/dL (ref 32.0–36.0)
MCV: 92.4 fL (ref 79.3–98.0)
MONO#: 0.5 10*3/uL (ref 0.1–0.9)
MONO%: 2.6 % (ref 0.0–14.0)
NEUT#: 16.3 10*3/uL — ABNORMAL HIGH (ref 1.5–6.5)
NEUT%: 92.1 % — ABNORMAL HIGH (ref 39.0–75.0)
Platelets: 155 10*3/uL (ref 140–400)
RBC: 4.02 10*6/uL — ABNORMAL LOW (ref 4.20–5.82)
RDW: 13.5 % (ref 11.0–14.6)
WBC: 17.6 10*3/uL — ABNORMAL HIGH (ref 4.0–10.3)
lymph#: 0.9 10*3/uL (ref 0.9–3.3)

## 2010-06-10 LAB — TECHNOLOGIST REVIEW

## 2010-06-10 LAB — LACTATE DEHYDROGENASE: LDH: 240 U/L (ref 94–250)

## 2010-06-16 NOTE — Op Note (Signed)
NAMEHASKEL, Robert Watkins                 ACCOUNT NO.:  [id number]  MEDICAL RECORD NO.:  [id number]          PATIENT TYPE:  INP  LOCATION:  3108                         FACILITY:  [facility]  PHYSICIAN:  Stefani Dama, M.D.  DATE OF BIRTH:  [birth year]  DATE OF PROCEDURE:  05/12/2010 DATE OF DISCHARGE:                              OPERATIVE REPORT   PREOPERATIVE DIAGNOSES:  Left occipital brain mass and seizure disorder.  POSTOPERATIVE DIAGNOSIS:  Left occipital brain mass and seizure disorder.  PROCEDURE:  Left occipital frameless stereotactic brain biopsy.  SURGEON:  Stefani Dama, MD  ANESTHESIA:  General endotracheal.  INDICATIONS:  Robert Watkins is a 25 year old right-handed individual who has had seizures that came upon him little over week ago.  He had rhabdomyolysis and acute renal failure.  Lesion was noted on MRI in left occipital lobe.  The lesion has peculiar findings, some suggestive of a low-grade glioma, other suggested of possible inflammatory process and the patient does have a history of traveling to [place] in the last years' time. At this time because there have been some significant changes in the MRI in the past weeks' time, he was taken to the operating room to undergo stereotactic brain biopsy.  PROCEDURE:  The patient was brought to the operating room supine on the stretcher.  After smooth induction of general endotracheal anesthesia, he was placed in the right lateral decubitus position after being placed in the 3-pin headrest.  Bed was secured and the stereotactic adapter was placed and a series of registration was performed to localize the patient's brain mass.  Once the registration was performed and the sites were verified, then we chose an entry site in the left occipital region. The scalpel above this area was shaved, prepped with alcohol and DuraPrep and draped in sterile fashion.  Then after draping sterilely and applying the sterile components of the  stereotactic localizing device, we verified that indeed the site had not changed any and the entry site remained appropriate to the target.  A vertical incision was made over the chosen area and the cranium was prepared by stripping the periosteum from this region.  A singular bur hole was created using a pineapple bit and when the dura was identified, opening was enlarged with 2-mm Kerrison punch.  The dura was noted be normal in texture.  The stereotactic base was then applied to the cranium at the site and stereotactic localization of the lesion continued.  Once the sites were dialed in and the appropriate depth was chosen at 99-mm, this was dialed into the stereotactic needle.  The dura was perforated with a #18 gauge spinal needle and then the brain biopsy needle was then inserted to the prescribed depth and we started to obtain cores of tissue in all 4 quadrants.  The initial cores appeared to be a mixture of both gray and white matter and these were sent for specimen.  Subsequent cores were obtained at 1 cm minus.  Frozen section diagnosis was returned as significant lymphocytic infiltration and perilymphocytic infiltration in sections of moderately abnormal brain tissue but not completely consistent with tumor.  A Gram stain that was sent from some of tissue revealed moderate polymorphonuclear cells with moderate amount of monocytes, no organisms seen.  After an adequate sample of coring was obtained, we disarticulated the biopsy pedestal and removed the brain biopsy needle.  The wound was irrigated copiously with antibiotic irrigating solution and closed with 2-0 Vicryl inverted interrupted fashion and surgical staples in the scalp.  The patient tolerated the procedure well, was removed from the 3-pin headrest and returned to recovery in stable condition.     Stefani Dama, M.D.     Merla Riches  D:  05/12/2010  T:  05/13/2010  Job:  [id number]  Electronically Signed by  Barnett Abu M.D. on 06/16/2010 07:54:33 AM

## 2010-06-28 LAB — AFB CULTURE WITH SMEAR (NOT AT ARMC): Acid Fast Smear: NONE SEEN

## 2010-07-05 ENCOUNTER — Other Ambulatory Visit: Payer: Self-pay | Admitting: Oncology

## 2010-07-05 LAB — CBC WITH DIFFERENTIAL/PLATELET
BASO%: 0.4 % (ref 0.0–2.0)
Basophils Absolute: 0 10*3/uL (ref 0.0–0.1)
EOS%: 0.6 % (ref 0.0–7.0)
Eosinophils Absolute: 0 10*3/uL (ref 0.0–0.5)
HCT: 39.2 % (ref 38.4–49.9)
HGB: 13.9 g/dL (ref 13.0–17.1)
LYMPH%: 18.2 % (ref 14.0–49.0)
MCH: 33.3 pg (ref 27.2–33.4)
MCHC: 35.3 g/dL (ref 32.0–36.0)
MCV: 94.3 fL (ref 79.3–98.0)
MONO#: 0.7 10*3/uL (ref 0.1–0.9)
MONO%: 7.8 % (ref 0.0–14.0)
NEUT#: 6.2 10*3/uL (ref 1.5–6.5)
NEUT%: 73 % (ref 39.0–75.0)
Platelets: 152 10*3/uL (ref 140–400)
RBC: 4.15 10*6/uL — ABNORMAL LOW (ref 4.20–5.82)
RDW: 17.3 % — ABNORMAL HIGH (ref 11.0–14.6)
WBC: 8.6 10*3/uL (ref 4.0–10.3)
lymph#: 1.6 10*3/uL (ref 0.9–3.3)

## 2010-07-08 ENCOUNTER — Other Ambulatory Visit: Payer: Self-pay | Admitting: Oncology

## 2010-07-08 ENCOUNTER — Encounter (HOSPITAL_BASED_OUTPATIENT_CLINIC_OR_DEPARTMENT_OTHER): Payer: BC Managed Care – PPO | Admitting: Oncology

## 2010-07-08 DIAGNOSIS — C713 Malignant neoplasm of parietal lobe: Secondary | ICD-10-CM

## 2010-07-08 LAB — CBC WITH DIFFERENTIAL/PLATELET
BASO%: 0.2 % (ref 0.0–2.0)
Basophils Absolute: 0 10*3/uL (ref 0.0–0.1)
EOS%: 0.3 % (ref 0.0–7.0)
Eosinophils Absolute: 0 10*3/uL (ref 0.0–0.5)
HCT: 42.9 % (ref 38.4–49.9)
HGB: 15.4 g/dL (ref 13.0–17.1)
LYMPH%: 13.7 % — ABNORMAL LOW (ref 14.0–49.0)
MCH: 33.8 pg — ABNORMAL HIGH (ref 27.2–33.4)
MCHC: 35.8 g/dL (ref 32.0–36.0)
MCV: 94.6 fL (ref 79.3–98.0)
MONO#: 0.6 10*3/uL (ref 0.1–0.9)
MONO%: 7.6 % (ref 0.0–14.0)
NEUT#: 6.1 10*3/uL (ref 1.5–6.5)
NEUT%: 78.2 % — ABNORMAL HIGH (ref 39.0–75.0)
Platelets: 150 10*3/uL (ref 140–400)
RBC: 4.54 10*6/uL (ref 4.20–5.82)
RDW: 16.9 % — ABNORMAL HIGH (ref 11.0–14.6)
WBC: 7.8 10*3/uL (ref 4.0–10.3)
lymph#: 1.1 10*3/uL (ref 0.9–3.3)

## 2010-07-11 ENCOUNTER — Encounter (HOSPITAL_BASED_OUTPATIENT_CLINIC_OR_DEPARTMENT_OTHER): Payer: BC Managed Care – PPO | Admitting: Oncology

## 2010-07-11 ENCOUNTER — Other Ambulatory Visit: Payer: Self-pay | Admitting: Oncology

## 2010-07-11 DIAGNOSIS — C713 Malignant neoplasm of parietal lobe: Secondary | ICD-10-CM

## 2010-07-11 LAB — CBC WITH DIFFERENTIAL/PLATELET
BASO%: 0.3 % (ref 0.0–2.0)
Basophils Absolute: 0 10*3/uL (ref 0.0–0.1)
EOS%: 1.2 % (ref 0.0–7.0)
Eosinophils Absolute: 0.1 10*3/uL (ref 0.0–0.5)
HCT: 35.6 % — ABNORMAL LOW (ref 38.4–49.9)
HGB: 12.7 g/dL — ABNORMAL LOW (ref 13.0–17.1)
LYMPH%: 25.5 % (ref 14.0–49.0)
MCH: 33.4 pg (ref 27.2–33.4)
MCHC: 35.8 g/dL (ref 32.0–36.0)
MCV: 93.3 fL (ref 79.3–98.0)
MONO#: 0.6 10*3/uL (ref 0.1–0.9)
MONO%: 11.2 % (ref 0.0–14.0)
NEUT#: 3.2 10*3/uL (ref 1.5–6.5)
NEUT%: 61.8 % (ref 39.0–75.0)
Platelets: 145 10*3/uL (ref 140–400)
RBC: 3.82 10*6/uL — ABNORMAL LOW (ref 4.20–5.82)
RDW: 16.6 % — ABNORMAL HIGH (ref 11.0–14.6)
WBC: 5.1 10*3/uL (ref 4.0–10.3)
lymph#: 1.3 10*3/uL (ref 0.9–3.3)

## 2010-07-11 LAB — COMPREHENSIVE METABOLIC PANEL
ALT: 73 U/L — ABNORMAL HIGH (ref 0–53)
AST: 33 U/L (ref 0–37)
Albumin: 4.1 g/dL (ref 3.5–5.2)
Alkaline Phosphatase: 63 U/L (ref 39–117)
BUN: 10 mg/dL (ref 6–23)
CO2: 26 mEq/L (ref 19–32)
Calcium: 9.2 mg/dL (ref 8.4–10.5)
Chloride: 105 mEq/L (ref 96–112)
Creatinine, Ser: 0.98 mg/dL (ref 0.40–1.50)
Glucose, Bld: 91 mg/dL (ref 70–99)
Potassium: 4.2 mEq/L (ref 3.5–5.3)
Sodium: 143 mEq/L (ref 135–145)
Total Bilirubin: 0.9 mg/dL (ref 0.3–1.2)
Total Protein: 5.9 g/dL — ABNORMAL LOW (ref 6.0–8.3)

## 2010-07-11 LAB — LACTATE DEHYDROGENASE: LDH: 280 U/L — ABNORMAL HIGH (ref 94–250)

## 2010-07-14 LAB — CULTURE, BLOOD (SINGLE)

## 2010-07-19 ENCOUNTER — Other Ambulatory Visit: Payer: Self-pay | Admitting: Oncology

## 2010-07-19 ENCOUNTER — Encounter (HOSPITAL_BASED_OUTPATIENT_CLINIC_OR_DEPARTMENT_OTHER): Payer: BC Managed Care – PPO | Admitting: Oncology

## 2010-07-19 DIAGNOSIS — C713 Malignant neoplasm of parietal lobe: Secondary | ICD-10-CM

## 2010-07-19 LAB — CBC WITH DIFFERENTIAL/PLATELET
BASO%: 0.3 % (ref 0.0–2.0)
Basophils Absolute: 0 10*3/uL (ref 0.0–0.1)
EOS%: 0.3 % (ref 0.0–7.0)
Eosinophils Absolute: 0 10*3/uL (ref 0.0–0.5)
HCT: 39.6 % (ref 38.4–49.9)
HGB: 14.5 g/dL (ref 13.0–17.1)
LYMPH%: 13.2 % — ABNORMAL LOW (ref 14.0–49.0)
MCH: 32.5 pg (ref 27.2–33.4)
MCHC: 36.6 g/dL — ABNORMAL HIGH (ref 32.0–36.0)
MCV: 88.8 fL (ref 79.3–98.0)
MONO#: 0.6 10*3/uL (ref 0.1–0.9)
MONO%: 5.4 % (ref 0.0–14.0)
NEUT#: 9.6 10*3/uL — ABNORMAL HIGH (ref 1.5–6.5)
NEUT%: 80.8 % — ABNORMAL HIGH (ref 39.0–75.0)
Platelets: 228 10*3/uL (ref 140–400)
RBC: 4.46 10*6/uL (ref 4.20–5.82)
RDW: 15.7 % — ABNORMAL HIGH (ref 11.0–14.6)
WBC: 11.9 10*3/uL — ABNORMAL HIGH (ref 4.0–10.3)
lymph#: 1.6 10*3/uL (ref 0.9–3.3)

## 2010-07-22 ENCOUNTER — Other Ambulatory Visit: Payer: Self-pay | Admitting: Radiation Oncology

## 2010-07-22 DIAGNOSIS — C719 Malignant neoplasm of brain, unspecified: Secondary | ICD-10-CM

## 2010-07-26 ENCOUNTER — Other Ambulatory Visit: Payer: Self-pay | Admitting: Oncology

## 2010-07-26 ENCOUNTER — Encounter (HOSPITAL_BASED_OUTPATIENT_CLINIC_OR_DEPARTMENT_OTHER): Payer: BC Managed Care – PPO | Admitting: Oncology

## 2010-07-26 DIAGNOSIS — C713 Malignant neoplasm of parietal lobe: Secondary | ICD-10-CM

## 2010-07-26 LAB — CBC WITH DIFFERENTIAL/PLATELET
BASO%: 0.2 % (ref 0.0–2.0)
Basophils Absolute: 0 10*3/uL (ref 0.0–0.1)
EOS%: 1.1 % (ref 0.0–7.0)
Eosinophils Absolute: 0.1 10*3/uL (ref 0.0–0.5)
HCT: 41.6 % (ref 38.4–49.9)
HGB: 14.5 g/dL (ref 13.0–17.1)
LYMPH%: 18.3 % (ref 14.0–49.0)
MCH: 34 pg — ABNORMAL HIGH (ref 27.2–33.4)
MCHC: 34.9 g/dL (ref 32.0–36.0)
MCV: 97.4 fL (ref 79.3–98.0)
MONO#: 0.5 10*3/uL (ref 0.1–0.9)
MONO%: 5.5 % (ref 0.0–14.0)
NEUT#: 7.3 10*3/uL — ABNORMAL HIGH (ref 1.5–6.5)
NEUT%: 74.9 % (ref 39.0–75.0)
Platelets: 193 10*3/uL (ref 140–400)
RBC: 4.27 10*6/uL (ref 4.20–5.82)
RDW: 17.9 % — ABNORMAL HIGH (ref 11.0–14.6)
WBC: 9.7 10*3/uL (ref 4.0–10.3)
lymph#: 1.8 10*3/uL (ref 0.9–3.3)

## 2010-08-02 ENCOUNTER — Other Ambulatory Visit: Payer: Self-pay | Admitting: Oncology

## 2010-08-02 ENCOUNTER — Encounter (HOSPITAL_BASED_OUTPATIENT_CLINIC_OR_DEPARTMENT_OTHER): Payer: BC Managed Care – PPO | Admitting: Oncology

## 2010-08-02 DIAGNOSIS — C713 Malignant neoplasm of parietal lobe: Secondary | ICD-10-CM

## 2010-08-02 LAB — CBC WITH DIFFERENTIAL/PLATELET
BASO%: 0.3 % (ref 0.0–2.0)
Basophils Absolute: 0 10*3/uL (ref 0.0–0.1)
EOS%: 2.3 % (ref 0.0–7.0)
Eosinophils Absolute: 0.2 10*3/uL (ref 0.0–0.5)
HCT: 44.4 % (ref 38.4–49.9)
HGB: 15.4 g/dL (ref 13.0–17.1)
LYMPH%: 17.9 % (ref 14.0–49.0)
MCH: 34.3 pg — ABNORMAL HIGH (ref 27.2–33.4)
MCHC: 34.7 g/dL (ref 32.0–36.0)
MCV: 98.8 fL — ABNORMAL HIGH (ref 79.3–98.0)
MONO#: 0.4 10*3/uL (ref 0.1–0.9)
MONO%: 4.9 % (ref 0.0–14.0)
NEUT#: 6.1 10*3/uL (ref 1.5–6.5)
NEUT%: 74.6 % (ref 39.0–75.0)
Platelets: 161 10*3/uL (ref 140–400)
RBC: 4.5 10*6/uL (ref 4.20–5.82)
RDW: 16.6 % — ABNORMAL HIGH (ref 11.0–14.6)
WBC: 8.2 10*3/uL (ref 4.0–10.3)
lymph#: 1.5 10*3/uL (ref 0.9–3.3)

## 2010-08-02 LAB — COMPREHENSIVE METABOLIC PANEL
ALT: 72 U/L — ABNORMAL HIGH (ref 0–53)
AST: 23 U/L (ref 0–37)
Albumin: 4.6 g/dL (ref 3.5–5.2)
Alkaline Phosphatase: 46 U/L (ref 39–117)
BUN: 18 mg/dL (ref 6–23)
CO2: 22 mEq/L (ref 19–32)
Calcium: 9.6 mg/dL (ref 8.4–10.5)
Chloride: 103 mEq/L (ref 96–112)
Creatinine, Ser: 0.9 mg/dL (ref 0.40–1.50)
Glucose, Bld: 76 mg/dL (ref 70–99)
Potassium: 4 mEq/L (ref 3.5–5.3)
Sodium: 137 mEq/L (ref 135–145)
Total Bilirubin: 0.6 mg/dL (ref 0.3–1.2)
Total Protein: 6.4 g/dL (ref 6.0–8.3)

## 2010-08-02 LAB — LACTATE DEHYDROGENASE: LDH: 205 U/L (ref 94–250)

## 2010-08-13 ENCOUNTER — Ambulatory Visit (HOSPITAL_COMMUNITY)
Admission: RE | Admit: 2010-08-13 | Discharge: 2010-08-13 | Disposition: A | Discharge status: Home / Self Care | Payer: BC Managed Care – PPO | Source: Ambulatory Visit | Attending: Radiation Oncology | Admitting: Radiation Oncology

## 2010-08-13 DIAGNOSIS — C714 Malignant neoplasm of occipital lobe: Secondary | ICD-10-CM | POA: Insufficient documentation

## 2010-08-13 DIAGNOSIS — C719 Malignant neoplasm of brain, unspecified: Secondary | ICD-10-CM

## 2010-08-13 MED ORDER — GADOBENATE DIMEGLUMINE 529 MG/ML IV SOLN
20.0000 mL | Freq: Once | INTRAVENOUS | Status: AC | PRN
  Administered 2010-08-13: 20 mL via INTRAVENOUS

## 2010-08-15 ENCOUNTER — Other Ambulatory Visit (HOSPITAL_COMMUNITY): Payer: BC Managed Care – PPO

## 2010-09-16 ENCOUNTER — Other Ambulatory Visit: Payer: Self-pay | Admitting: Oncology

## 2010-09-16 ENCOUNTER — Encounter (HOSPITAL_BASED_OUTPATIENT_CLINIC_OR_DEPARTMENT_OTHER): Payer: BC Managed Care – PPO | Admitting: Oncology

## 2010-09-16 DIAGNOSIS — C713 Malignant neoplasm of parietal lobe: Secondary | ICD-10-CM

## 2010-09-16 LAB — CBC WITH DIFFERENTIAL/PLATELET
BASO%: 0.3 % (ref 0.0–2.0)
Basophils Absolute: 0 10*3/uL (ref 0.0–0.1)
EOS%: 1.9 % (ref 0.0–7.0)
Eosinophils Absolute: 0.1 10*3/uL (ref 0.0–0.5)
HCT: 46.2 % (ref 38.4–49.9)
HGB: 16.7 g/dL (ref 13.0–17.1)
LYMPH%: 28.2 % (ref 14.0–49.0)
MCH: 33 pg (ref 27.2–33.4)
MCHC: 36.1 g/dL — ABNORMAL HIGH (ref 32.0–36.0)
MCV: 91.4 fL (ref 79.3–98.0)
MONO#: 0.3 10*3/uL (ref 0.1–0.9)
MONO%: 8.4 % (ref 0.0–14.0)
NEUT#: 2.4 10*3/uL (ref 1.5–6.5)
NEUT%: 61.2 % (ref 39.0–75.0)
Platelets: 200 10*3/uL (ref 140–400)
RBC: 5.05 10*6/uL (ref 4.20–5.82)
RDW: 12.1 % (ref 11.0–14.6)
WBC: 4 10*3/uL (ref 4.0–10.3)
lymph#: 1.1 10*3/uL (ref 0.9–3.3)

## 2010-09-16 LAB — COMPREHENSIVE METABOLIC PANEL
ALT: 38 U/L (ref 0–53)
AST: 21 U/L (ref 0–37)
Albumin: 4.4 g/dL (ref 3.5–5.2)
Alkaline Phosphatase: 72 U/L (ref 39–117)
BUN: 9 mg/dL (ref 6–23)
CO2: 25 mEq/L (ref 19–32)
Calcium: 9.3 mg/dL (ref 8.4–10.5)
Chloride: 104 mEq/L (ref 96–112)
Creatinine, Ser: 1.04 mg/dL (ref 0.40–1.50)
Glucose, Bld: 95 mg/dL (ref 70–99)
Potassium: 3.4 mEq/L — ABNORMAL LOW (ref 3.5–5.3)
Sodium: 139 mEq/L (ref 135–145)
Total Bilirubin: 0.7 mg/dL (ref 0.3–1.2)
Total Protein: 6.6 g/dL (ref 6.0–8.3)

## 2010-09-16 LAB — LACTATE DEHYDROGENASE: LDH: 188 U/L (ref 94–250)

## 2010-10-03 ENCOUNTER — Other Ambulatory Visit: Payer: Self-pay | Admitting: Oncology

## 2010-10-03 ENCOUNTER — Encounter (HOSPITAL_BASED_OUTPATIENT_CLINIC_OR_DEPARTMENT_OTHER): Payer: BC Managed Care – PPO | Admitting: Oncology

## 2010-10-03 DIAGNOSIS — C713 Malignant neoplasm of parietal lobe: Secondary | ICD-10-CM

## 2010-10-03 LAB — CBC WITH DIFFERENTIAL/PLATELET
BASO%: 0.5 % (ref 0.0–2.0)
Basophils Absolute: 0 10*3/uL (ref 0.0–0.1)
EOS%: 3.7 % (ref 0.0–7.0)
Eosinophils Absolute: 0.2 10*3/uL (ref 0.0–0.5)
HCT: 43.5 % (ref 38.4–49.9)
HGB: 15.4 g/dL (ref 13.0–17.1)
LYMPH%: 22.1 % (ref 14.0–49.0)
MCH: 32.4 pg (ref 27.2–33.4)
MCHC: 35.3 g/dL (ref 32.0–36.0)
MCV: 92 fL (ref 79.3–98.0)
MONO#: 0.3 10*3/uL (ref 0.1–0.9)
MONO%: 5.6 % (ref 0.0–14.0)
NEUT#: 3.6 10*3/uL (ref 1.5–6.5)
NEUT%: 68.1 % (ref 39.0–75.0)
Platelets: 174 10*3/uL (ref 140–400)
RBC: 4.73 10*6/uL (ref 4.20–5.82)
RDW: 11.9 % (ref 11.0–14.6)
WBC: 5.3 10*3/uL (ref 4.0–10.3)
lymph#: 1.2 10*3/uL (ref 0.9–3.3)

## 2010-10-20 ENCOUNTER — Other Ambulatory Visit: Payer: Self-pay | Admitting: Oncology

## 2010-10-20 ENCOUNTER — Encounter (HOSPITAL_BASED_OUTPATIENT_CLINIC_OR_DEPARTMENT_OTHER): Payer: BC Managed Care – PPO | Admitting: Oncology

## 2010-10-20 DIAGNOSIS — C713 Malignant neoplasm of parietal lobe: Secondary | ICD-10-CM

## 2010-10-20 LAB — COMPREHENSIVE METABOLIC PANEL
ALT: 36 U/L (ref 0–53)
AST: 23 U/L (ref 0–37)
Albumin: 4.3 g/dL (ref 3.5–5.2)
Alkaline Phosphatase: 92 U/L (ref 39–117)
BUN: 10 mg/dL (ref 6–23)
CO2: 28 mEq/L (ref 19–32)
Calcium: 10.3 mg/dL (ref 8.4–10.5)
Chloride: 102 mEq/L (ref 96–112)
Creatinine, Ser: 0.92 mg/dL (ref 0.50–1.35)
Glucose, Bld: 87 mg/dL (ref 70–99)
Potassium: 4.1 mEq/L (ref 3.5–5.3)
Sodium: 137 mEq/L (ref 135–145)
Total Bilirubin: 1 mg/dL (ref 0.3–1.2)
Total Protein: 7.2 g/dL (ref 6.0–8.3)

## 2010-10-20 LAB — CBC WITH DIFFERENTIAL/PLATELET
BASO%: 0.4 % (ref 0.0–2.0)
Basophils Absolute: 0 10*3/uL (ref 0.0–0.1)
EOS%: 3 % (ref 0.0–7.0)
Eosinophils Absolute: 0.1 10*3/uL (ref 0.0–0.5)
HCT: 48.9 % (ref 38.4–49.9)
HGB: 17.3 g/dL — ABNORMAL HIGH (ref 13.0–17.1)
LYMPH%: 23.8 % (ref 14.0–49.0)
MCH: 31.8 pg (ref 27.2–33.4)
MCHC: 35.4 g/dL (ref 32.0–36.0)
MCV: 89.9 fL (ref 79.3–98.0)
MONO#: 0.3 10*3/uL (ref 0.1–0.9)
MONO%: 7 % (ref 0.0–14.0)
NEUT#: 2.7 10*3/uL (ref 1.5–6.5)
NEUT%: 65.8 % (ref 39.0–75.0)
Platelets: 181 10*3/uL (ref 140–400)
RBC: 5.44 10*6/uL (ref 4.20–5.82)
RDW: 12.1 % (ref 11.0–14.6)
WBC: 4.1 10*3/uL (ref 4.0–10.3)
lymph#: 1 10*3/uL (ref 0.9–3.3)

## 2010-10-20 LAB — LACTATE DEHYDROGENASE: LDH: 193 U/L (ref 94–250)

## 2010-11-09 ENCOUNTER — Other Ambulatory Visit: Payer: Self-pay | Admitting: Oncology

## 2010-11-09 ENCOUNTER — Encounter (HOSPITAL_BASED_OUTPATIENT_CLINIC_OR_DEPARTMENT_OTHER): Payer: BC Managed Care – PPO | Admitting: Oncology

## 2010-11-09 DIAGNOSIS — C713 Malignant neoplasm of parietal lobe: Secondary | ICD-10-CM

## 2010-11-09 LAB — CBC WITH DIFFERENTIAL/PLATELET
BASO%: 0.3 % (ref 0.0–2.0)
Basophils Absolute: 0 10*3/uL (ref 0.0–0.1)
EOS%: 2.4 % (ref 0.0–7.0)
Eosinophils Absolute: 0.1 10*3/uL (ref 0.0–0.5)
HCT: 47.2 % (ref 38.4–49.9)
HGB: 16.8 g/dL (ref 13.0–17.1)
LYMPH%: 24.8 % (ref 14.0–49.0)
MCH: 32.1 pg (ref 27.2–33.4)
MCHC: 35.7 g/dL (ref 32.0–36.0)
MCV: 89.9 fL (ref 79.3–98.0)
MONO#: 0.3 10*3/uL (ref 0.1–0.9)
MONO%: 6.2 % (ref 0.0–14.0)
NEUT#: 2.8 10*3/uL (ref 1.5–6.5)
NEUT%: 66.3 % (ref 39.0–75.0)
Platelets: 188 10*3/uL (ref 140–400)
RBC: 5.25 10*6/uL (ref 4.20–5.82)
RDW: 13.4 % (ref 11.0–14.6)
WBC: 4.3 10*3/uL (ref 4.0–10.3)
lymph#: 1.1 10*3/uL (ref 0.9–3.3)

## 2010-12-12 ENCOUNTER — Encounter (HOSPITAL_BASED_OUTPATIENT_CLINIC_OR_DEPARTMENT_OTHER): Payer: BC Managed Care – PPO | Admitting: Oncology

## 2010-12-12 ENCOUNTER — Other Ambulatory Visit: Payer: Self-pay | Admitting: Oncology

## 2010-12-12 DIAGNOSIS — C713 Malignant neoplasm of parietal lobe: Secondary | ICD-10-CM

## 2010-12-12 LAB — CBC WITH DIFFERENTIAL/PLATELET
BASO%: 0.3 % (ref 0.0–2.0)
Basophils Absolute: 0 10*3/uL (ref 0.0–0.1)
EOS%: 1.8 % (ref 0.0–7.0)
Eosinophils Absolute: 0.1 10*3/uL (ref 0.0–0.5)
HCT: 50.4 % — ABNORMAL HIGH (ref 38.4–49.9)
HGB: 17.8 g/dL — ABNORMAL HIGH (ref 13.0–17.1)
LYMPH%: 22.5 % (ref 14.0–49.0)
MCH: 32.1 pg (ref 27.2–33.4)
MCHC: 35.2 g/dL (ref 32.0–36.0)
MCV: 91.2 fL (ref 79.3–98.0)
MONO#: 0.3 10*3/uL (ref 0.1–0.9)
MONO%: 4.3 % (ref 0.0–14.0)
NEUT#: 4.3 10*3/uL (ref 1.5–6.5)
NEUT%: 71.1 % (ref 39.0–75.0)
Platelets: 170 10*3/uL (ref 140–400)
RBC: 5.53 10*6/uL (ref 4.20–5.82)
RDW: 13.6 % (ref 11.0–14.6)
WBC: 6 10*3/uL (ref 4.0–10.3)
lymph#: 1.3 10*3/uL (ref 0.9–3.3)

## 2010-12-12 LAB — COMPREHENSIVE METABOLIC PANEL
ALT: 27 U/L (ref 0–53)
AST: 17 U/L (ref 0–37)
Albumin: 4.2 g/dL (ref 3.5–5.2)
Alkaline Phosphatase: 99 U/L (ref 39–117)
BUN: 11 mg/dL (ref 6–23)
CO2: 24 mEq/L (ref 19–32)
Calcium: 9.4 mg/dL (ref 8.4–10.5)
Chloride: 106 mEq/L (ref 96–112)
Creatinine, Ser: 1 mg/dL (ref 0.50–1.35)
Glucose, Bld: 103 mg/dL — ABNORMAL HIGH (ref 70–99)
Potassium: 3.9 mEq/L (ref 3.5–5.3)
Sodium: 140 mEq/L (ref 135–145)
Total Bilirubin: 0.6 mg/dL (ref 0.3–1.2)
Total Protein: 6.4 g/dL (ref 6.0–8.3)

## 2010-12-12 LAB — LACTATE DEHYDROGENASE: LDH: 131 U/L (ref 94–250)

## 2011-01-09 ENCOUNTER — Other Ambulatory Visit: Payer: Self-pay | Admitting: Oncology

## 2011-01-09 ENCOUNTER — Encounter (HOSPITAL_BASED_OUTPATIENT_CLINIC_OR_DEPARTMENT_OTHER): Payer: BC Managed Care – PPO | Admitting: Oncology

## 2011-01-09 DIAGNOSIS — Z923 Personal history of irradiation: Secondary | ICD-10-CM

## 2011-01-09 DIAGNOSIS — C713 Malignant neoplasm of parietal lobe: Secondary | ICD-10-CM

## 2011-01-09 DIAGNOSIS — Z79899 Other long term (current) drug therapy: Secondary | ICD-10-CM

## 2011-01-09 DIAGNOSIS — F4321 Adjustment disorder with depressed mood: Secondary | ICD-10-CM

## 2011-01-09 LAB — CBC WITH DIFFERENTIAL/PLATELET
BASO%: 0.3 % (ref 0.0–2.0)
Basophils Absolute: 0 10*3/uL (ref 0.0–0.1)
EOS%: 1.7 % (ref 0.0–7.0)
Eosinophils Absolute: 0.1 10*3/uL (ref 0.0–0.5)
HCT: 48.6 % (ref 38.4–49.9)
HGB: 17.5 g/dL — ABNORMAL HIGH (ref 13.0–17.1)
LYMPH%: 15.4 % (ref 14.0–49.0)
MCH: 32.8 pg (ref 27.2–33.4)
MCHC: 36.1 g/dL — ABNORMAL HIGH (ref 32.0–36.0)
MCV: 91 fL (ref 79.3–98.0)
MONO#: 0.3 10*3/uL (ref 0.1–0.9)
MONO%: 4.8 % (ref 0.0–14.0)
NEUT#: 4.1 10*3/uL (ref 1.5–6.5)
NEUT%: 77.8 % — ABNORMAL HIGH (ref 39.0–75.0)
Platelets: 154 10*3/uL (ref 140–400)
RBC: 5.34 10*6/uL (ref 4.20–5.82)
RDW: 13.4 % (ref 11.0–14.6)
WBC: 5.3 10*3/uL (ref 4.0–10.3)
lymph#: 0.8 10*3/uL — ABNORMAL LOW (ref 0.9–3.3)

## 2011-01-09 LAB — COMPREHENSIVE METABOLIC PANEL
ALT: 32 U/L (ref 0–53)
AST: 23 U/L (ref 0–37)
Albumin: 4.3 g/dL (ref 3.5–5.2)
Alkaline Phosphatase: 100 U/L (ref 39–117)
BUN: 9 mg/dL (ref 6–23)
CO2: 23 mEq/L (ref 19–32)
Calcium: 9.3 mg/dL (ref 8.4–10.5)
Chloride: 108 mEq/L (ref 96–112)
Creatinine, Ser: 0.96 mg/dL (ref 0.50–1.35)
Glucose, Bld: 91 mg/dL (ref 70–99)
Potassium: 4.2 mEq/L (ref 3.5–5.3)
Sodium: 140 mEq/L (ref 135–145)
Total Bilirubin: 0.7 mg/dL (ref 0.3–1.2)
Total Protein: 6.2 g/dL (ref 6.0–8.3)

## 2011-01-09 LAB — LACTATE DEHYDROGENASE: LDH: 126 U/L (ref 94–250)

## 2011-02-07 ENCOUNTER — Other Ambulatory Visit: Payer: Self-pay | Admitting: Oncology

## 2011-02-07 ENCOUNTER — Encounter (HOSPITAL_BASED_OUTPATIENT_CLINIC_OR_DEPARTMENT_OTHER): Payer: BC Managed Care – PPO | Admitting: Oncology

## 2011-02-07 DIAGNOSIS — C713 Malignant neoplasm of parietal lobe: Secondary | ICD-10-CM

## 2011-02-07 DIAGNOSIS — D751 Secondary polycythemia: Secondary | ICD-10-CM

## 2011-02-07 DIAGNOSIS — A630 Anogenital (venereal) warts: Secondary | ICD-10-CM

## 2011-02-07 DIAGNOSIS — I10 Essential (primary) hypertension: Secondary | ICD-10-CM

## 2011-02-07 LAB — COMPREHENSIVE METABOLIC PANEL
ALT: 27 U/L (ref 0–53)
AST: 23 U/L (ref 0–37)
Albumin: 4.9 g/dL (ref 3.5–5.2)
Alkaline Phosphatase: 110 U/L (ref 39–117)
BUN: 11 mg/dL (ref 6–23)
CO2: 22 mEq/L (ref 19–32)
Calcium: 9.9 mg/dL (ref 8.4–10.5)
Chloride: 104 mEq/L (ref 96–112)
Creatinine, Ser: 1.26 mg/dL (ref 0.50–1.35)
Glucose, Bld: 80 mg/dL (ref 70–99)
Potassium: 4.1 mEq/L (ref 3.5–5.3)
Sodium: 140 mEq/L (ref 135–145)
Total Bilirubin: 0.8 mg/dL (ref 0.3–1.2)
Total Protein: 7 g/dL (ref 6.0–8.3)

## 2011-02-07 LAB — CBC WITH DIFFERENTIAL/PLATELET
BASO%: 0.4 % (ref 0.0–2.0)
Basophils Absolute: 0 10*3/uL (ref 0.0–0.1)
EOS%: 2 % (ref 0.0–7.0)
Eosinophils Absolute: 0.1 10*3/uL (ref 0.0–0.5)
HCT: 52.2 % — ABNORMAL HIGH (ref 38.4–49.9)
HGB: 18.3 g/dL — ABNORMAL HIGH (ref 13.0–17.1)
LYMPH%: 19.2 % (ref 14.0–49.0)
MCH: 32.5 pg (ref 27.2–33.4)
MCHC: 35 g/dL (ref 32.0–36.0)
MCV: 92.8 fL (ref 79.3–98.0)
MONO#: 0.3 10*3/uL (ref 0.1–0.9)
MONO%: 6.3 % (ref 0.0–14.0)
NEUT#: 3.9 10*3/uL (ref 1.5–6.5)
NEUT%: 72.1 % (ref 39.0–75.0)
Platelets: 161 10*3/uL (ref 140–400)
RBC: 5.62 10*6/uL (ref 4.20–5.82)
RDW: 13.8 % (ref 11.0–14.6)
WBC: 5.4 10*3/uL (ref 4.0–10.3)
lymph#: 1 10*3/uL (ref 0.9–3.3)

## 2011-02-07 LAB — LACTATE DEHYDROGENASE: LDH: 152 U/L (ref 94–250)

## 2011-03-07 ENCOUNTER — Other Ambulatory Visit: Payer: Self-pay | Admitting: Oncology

## 2011-03-07 ENCOUNTER — Other Ambulatory Visit: Payer: Self-pay | Admitting: Nurse Practitioner

## 2011-03-07 ENCOUNTER — Other Ambulatory Visit (HOSPITAL_BASED_OUTPATIENT_CLINIC_OR_DEPARTMENT_OTHER): Payer: BC Managed Care – PPO

## 2011-03-07 ENCOUNTER — Ambulatory Visit (HOSPITAL_BASED_OUTPATIENT_CLINIC_OR_DEPARTMENT_OTHER): Payer: BC Managed Care – PPO | Admitting: Nurse Practitioner

## 2011-03-07 VITALS — BP 125/80 | HR 66 | Temp 97.0°F | Ht 71.0 in | Wt 200.8 lb

## 2011-03-07 DIAGNOSIS — C719 Malignant neoplasm of brain, unspecified: Secondary | ICD-10-CM

## 2011-03-07 DIAGNOSIS — C713 Malignant neoplasm of parietal lobe: Secondary | ICD-10-CM

## 2011-03-07 DIAGNOSIS — I10 Essential (primary) hypertension: Secondary | ICD-10-CM

## 2011-03-07 DIAGNOSIS — D751 Secondary polycythemia: Secondary | ICD-10-CM

## 2011-03-07 LAB — COMPREHENSIVE METABOLIC PANEL
ALT: 29 U/L (ref 0–53)
AST: 20 U/L (ref 0–37)
Albumin: 4.9 g/dL (ref 3.5–5.2)
Alkaline Phosphatase: 116 U/L (ref 39–117)
BUN: 10 mg/dL (ref 6–23)
CO2: 20 mEq/L (ref 19–32)
Calcium: 10 mg/dL (ref 8.4–10.5)
Chloride: 107 mEq/L (ref 96–112)
Creatinine, Ser: 1.14 mg/dL (ref 0.50–1.35)
Glucose, Bld: 84 mg/dL (ref 70–99)
Potassium: 4.2 mEq/L (ref 3.5–5.3)
Sodium: 140 mEq/L (ref 135–145)
Total Bilirubin: 1.1 mg/dL (ref 0.3–1.2)
Total Protein: 7.1 g/dL (ref 6.0–8.3)

## 2011-03-07 LAB — CBC WITH DIFFERENTIAL/PLATELET
BASO%: 0.7 % (ref 0.0–2.0)
Basophils Absolute: 0 10*3/uL (ref 0.0–0.1)
EOS%: 1.5 % (ref 0.0–7.0)
Eosinophils Absolute: 0.1 10*3/uL (ref 0.0–0.5)
HCT: 52 % — ABNORMAL HIGH (ref 38.4–49.9)
HGB: 18.5 g/dL — ABNORMAL HIGH (ref 13.0–17.1)
LYMPH%: 16 % (ref 14.0–49.0)
MCH: 33 pg (ref 27.2–33.4)
MCHC: 35.5 g/dL (ref 32.0–36.0)
MCV: 92.9 fL (ref 79.3–98.0)
MONO#: 0.2 10*3/uL (ref 0.1–0.9)
MONO%: 4.5 % (ref 0.0–14.0)
NEUT#: 4.1 10*3/uL (ref 1.5–6.5)
NEUT%: 77.3 % — ABNORMAL HIGH (ref 39.0–75.0)
Platelets: 177 10*3/uL (ref 140–400)
RBC: 5.6 10*6/uL (ref 4.20–5.82)
RDW: 13.3 % (ref 11.0–14.6)
WBC: 5.4 10*3/uL (ref 4.0–10.3)
lymph#: 0.9 10*3/uL (ref 0.9–3.3)

## 2011-03-07 LAB — LACTATE DEHYDROGENASE: LDH: 153 U/L (ref 94–250)

## 2011-03-07 NOTE — Progress Notes (Signed)
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 He presented at that time with new onset seizures. He underwent subtotal resection by Dr. Haskell Riling at [facility] on 05/26/2010. He then completed cranial radiation 06/21/10 through 08/01/2010 with concomitant low-dose oral Temodar. He began maintenance Temodar for 5 days each month on 08/22/2010. He completed cycle 7 beginning 02/10/2011.  MRI of the brain 02/13/2011 at [facility] showed unchanged minimal enhancement and T2 intensity adjacent to the left occipital lobe resection site.  [name] reports that overall he feels well. He has mild intermittent nausea with the chemotherapy. He denies vomiting. He denies headaches. No vision change. He denies seizures. He continues Keppra. No skin rash. He denies mouth sores.   Objective: Blood pressure 125/80, pulse 66, temperature 97 F (36.1 C), height 5\' 11"  (1.803 m), weight 200 lb 12.8 oz (91.082 kg).  Pupils equal round and reactive to light. Extraocular movements are intact. Sclera anicteric. Oropharynx is without thrush or ulceration. Lungs are clear. No wheezes or rales. Regular cardiac rhythm. No murmur. Abdomen is soft and nontender. No organomegaly. Extremities are without edema. Calves are soft and nontender. Motor strength is 5 over 5. Finger-to-nose and rapid alternating movements intact. Gait is normal. He is alert, oriented and appropriate.    Lab Results:  Hemoglobin 18.5 white count 5.4 absolute neutrophil count 4.1 platelet count 177,000. Chemistry panel and LDH pending.  Studies/Results: No results found.  Medications: I have reviewed the patient's current medications.  Assessment/Plan: 1. 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 Zachery Conch at [facility] on 05/26/2010.  He completed cranial radiation 02/28 through  08/01/2010 with concomitant low-dose oral Temodar.  He began maintenance Temodar for 5 days each month on 08/22/2010.   He has completed 7 cycles of maintenance Temodar.  He appears to be tolerating the Temodar well. 2.     MRI of the brain 12/12/2010 with interval development of a questionable area of mild enhancement posterior to the left           parietal occipital resection cavity. Followup MRI 02/13/11 showed unchanged minimal enhancement and T2 intensity                 adjacent to the left occipital lobe resection site. 3.     Essential hypertension controlled on low-dose Norvasc.   4.     Relative polycythemia with stable hemoglobin and hematocrit at upper limit of normal.   Disposition: [name] appears stable. He will begin cycle 8 maintenance Temodar as scheduled on 03/10/2011. He is scheduled to return to [facility] for an MRI and office visit on 04/05/2011. He will begin cycle 9 of Temodar on 04/07/2011 if the blood counts are stable following his visit at [facility]. He will return for a followup visit with Dr. Cyndie Chime on 05/01/2011. He will contact the office in the interim with any problems.   Lonna Cobb ANP/GNP-BC

## 2011-04-07 ENCOUNTER — Encounter: Payer: Self-pay | Admitting: Oncology

## 2011-04-07 ENCOUNTER — Other Ambulatory Visit (HOSPITAL_BASED_OUTPATIENT_CLINIC_OR_DEPARTMENT_OTHER): Payer: BC Managed Care – PPO | Admitting: Lab

## 2011-04-07 ENCOUNTER — Telehealth: Payer: Self-pay | Admitting: Oncology

## 2011-04-07 ENCOUNTER — Other Ambulatory Visit: Payer: Self-pay | Admitting: Oncology

## 2011-04-07 ENCOUNTER — Ambulatory Visit (HOSPITAL_BASED_OUTPATIENT_CLINIC_OR_DEPARTMENT_OTHER): Payer: BC Managed Care – PPO | Admitting: Oncology

## 2011-04-07 VITALS — BP 128/82 | HR 70 | Temp 96.8°F | Ht 71.0 in | Wt 201.4 lb

## 2011-04-07 DIAGNOSIS — D751 Secondary polycythemia: Secondary | ICD-10-CM

## 2011-04-07 DIAGNOSIS — G40909 Epilepsy, unspecified, not intractable, without status epilepticus: Secondary | ICD-10-CM

## 2011-04-07 DIAGNOSIS — C719 Malignant neoplasm of brain, unspecified: Secondary | ICD-10-CM | POA: Insufficient documentation

## 2011-04-07 DIAGNOSIS — F329 Major depressive disorder, single episode, unspecified: Secondary | ICD-10-CM

## 2011-04-07 DIAGNOSIS — I10 Essential (primary) hypertension: Secondary | ICD-10-CM

## 2011-04-07 DIAGNOSIS — C713 Malignant neoplasm of parietal lobe: Secondary | ICD-10-CM

## 2011-04-07 DIAGNOSIS — A630 Anogenital (venereal) warts: Secondary | ICD-10-CM

## 2011-04-07 HISTORY — DX: Malignant neoplasm of brain, unspecified: C71.9

## 2011-04-07 HISTORY — DX: Epilepsy, unspecified, not intractable, without status epilepticus: G40.909

## 2011-04-07 HISTORY — DX: Major depressive disorder, single episode, unspecified: F32.9

## 2011-04-07 LAB — COMPREHENSIVE METABOLIC PANEL
ALT: 36 U/L (ref 0–53)
AST: 22 U/L (ref 0–37)
Albumin: 4.5 g/dL (ref 3.5–5.2)
Alkaline Phosphatase: 105 U/L (ref 39–117)
BUN: 15 mg/dL (ref 6–23)
CO2: 21 mEq/L (ref 19–32)
Calcium: 9.5 mg/dL (ref 8.4–10.5)
Chloride: 105 mEq/L (ref 96–112)
Creatinine, Ser: 1.11 mg/dL (ref 0.50–1.35)
Glucose, Bld: 94 mg/dL (ref 70–99)
Potassium: 3.8 mEq/L (ref 3.5–5.3)
Sodium: 139 mEq/L (ref 135–145)
Total Bilirubin: 0.8 mg/dL (ref 0.3–1.2)
Total Protein: 5.9 g/dL — ABNORMAL LOW (ref 6.0–8.3)

## 2011-04-07 LAB — CBC WITH DIFFERENTIAL/PLATELET
BASO%: 0.5 % (ref 0.0–2.0)
Basophils Absolute: 0 10*3/uL (ref 0.0–0.1)
EOS%: 1.8 % (ref 0.0–7.0)
Eosinophils Absolute: 0.1 10*3/uL (ref 0.0–0.5)
HCT: 50.6 % — ABNORMAL HIGH (ref 38.4–49.9)
HGB: 18 g/dL — ABNORMAL HIGH (ref 13.0–17.1)
LYMPH%: 19.2 % (ref 14.0–49.0)
MCH: 33.5 pg — ABNORMAL HIGH (ref 27.2–33.4)
MCHC: 35.6 g/dL (ref 32.0–36.0)
MCV: 94 fL (ref 79.3–98.0)
MONO#: 0.4 10*3/uL (ref 0.1–0.9)
MONO%: 5.6 % (ref 0.0–14.0)
NEUT#: 4.9 10*3/uL (ref 1.5–6.5)
NEUT%: 72.9 % (ref 39.0–75.0)
Platelets: 159 10*3/uL (ref 140–400)
RBC: 5.38 10*6/uL (ref 4.20–5.82)
RDW: 12.8 % (ref 11.0–14.6)
WBC: 6.7 10*3/uL (ref 4.0–10.3)
lymph#: 1.3 10*3/uL (ref 0.9–3.3)

## 2011-04-07 LAB — LACTATE DEHYDROGENASE: LDH: 119 U/L (ref 94–250)

## 2011-04-07 NOTE — Telephone Encounter (Signed)
appt given to pt for 06/23/11 appt,aom

## 2011-04-07 NOTE — Progress Notes (Signed)
CC: Danise Edge, M.D.  Daniel B. Yetta Barre, M.D.  Murlean Hark, MD  Lurline Hare, M.D.  Stefani Dama, M.D.   Followup visit for this 25 year old man diagnosed with an anaplastic astrocytoma, left parietal lobe of brain, in January of this year.  Following surgical resection, he received standard low-dose Temodar chemotherapy with concomitant radiation, and he is currently on maintenance Temodar at 200 mg/sq m squared daily 5 days each month.     He is tolerating treatment well.  He has now had 6 cycles.   He had a new patient visit with Dr. Danise Edge.  He found some scattered genital warts on his penis.  He made a dermatology referral.  He was given a Pneumovax vaccine and a flu vaccine (02/03/11).  He was put on Norvasc 2.5 mg daily for hypertension.   He denies any seizure activity.  No headache.  No change in vision.  No dysarthria.  No focal weakness.  No unsteady gait.  No interim infections.  PHYSICAL EXAMINATION:  Vital Signs: Blood pressure 129/76, pulse 67, regular.  He is afebrile.  Weight 210 pounds, stable.  Head and Neck:  Normal.  Lungs:  Clear and resonant to percussion.  Heart:  Regular cardiac rhythm, no murmur.  Neurologic:  Mental status intact.  Cranial nerves intact.  Motor strength 5/5.  Reflexes absent, symmetric at the knees; 1+, symmetric at the biceps.  Pupils equal, reactive to light.  Optic discs sharp.  Vessels normal.  Tongue midline.  Palate elevates symmetrically.  Face is symmetric.  Rapid alternating movements, finger-to-finger, finger-to-hand all normal.  Gait normal.  He is able to spell his name backwards with ease.  Genitourinary:  There are a few scattered papillomatous-type lesions on the shaft of his penis.  LABORATORY:  Chronic elevation of hemoglobin 18 g with hematocrit 52%, white count 5400, platelets 161,000.  Chemistry profile pending.  IMPRESSION:   1. Anaplastic astrocytoma left parietal lobe of brain.  Stable on treatment.  He is  due for a followup visit at [facility] next week.  He will get his 8-month interval MRI scan there.  He will go ahead and start cycle #7 of Temodar this Friday, October the 19th.   2. Genital warts.  I am going to put him on some Valtrex 500 mg t.i.d. for 10 days and then 1 daily.  I am not sure how active this is since it is a papilloma virus and not a herpes virus.  He is reluctant to see the dermatologist at this time. 3. Essential hypertension controlled on low-dose Norvasc.  This may need further evaluation along the way,   given his young age. 4. Relative polycythemia with stable hemoglobin and hematocrit at upper limit of normal.    ______________________________ Levert Feinstein, M.D., F.A.C.P. JMG/MEDQ  D:  02/07/2011  T:  02/07/2011  Job:  210  04/07/11  Oncology Follow Up Visit: No is doing well at this time. He is in much better spirits. He no longer seems depressed. He is still smoking. We discussed this today. He is determined to try to stop. I offered him a trial of Chantix. He said he would rather try to go cold [place].    He is due to start cycle 9 of 12 adjuvant Temodar and tomorrow. He is tolerating the drug it full doses.  Review of systems: He denies any headache, double vision, blurry vision, seizure activity, slurred speech, focal weakness. No dyspnea no chest pain.  Physical exam: Head  and neck are normal. Pharynx no erythema or exudate. Lungs are clear and resonant to percussion. Regular cardiac rhythm no murmur. Abdomen soft nontender. Extremities no edema no calf tenderness. Neurologic mental status intact cranial nerves intact motor strength 5 over 5 reflexes 1+ symmetric pupils round reactive to light no nystagmus, optic discs sharp vessels normal no hemorrhage or exudate tongue is midline palate elevates symmetrically, finger to finger finger to hand rapid alternating movements and gait are all normal.  Impression: #1. Anaplastic astrocytoma left parietal lobe  diagnosed in January 2012. He presented with new onset seizures. Status post subtotal resection at [facility] 05/26/10. Cranial radiation with concomitant low-dose oral Temodar 2/28 through 4/9. He continues on maintenance Temodar 200 mg per meter squared 5 days each month started on/30. Followup MRI at [facility] earlier this week reported to him as stable. Plan: Continue current program.  #2. Acquired seizure disorder secondary to #1. Remained stable with no seizure activity on Keppra.  #3. Reactive depression. Not clear if some component of this was organic from the developing brain cancer but I suspect that this is the case.  #4. Tobacco addiction. He is aware of this and is trying to stop smoking at this time.  #5. As genital warts. No progression per his history. I did not do a GU exam today. No response to Valtrex which was anticipated and I told him to stop it  #6. Smoker's polycythemia. This should resolve when he gets off his cigarettes.  #7. Essential hypertension. Blood pressure appears have normalized over the last 2 months.

## 2011-05-01 ENCOUNTER — Other Ambulatory Visit: Payer: Self-pay | Admitting: Oncology

## 2011-05-01 ENCOUNTER — Other Ambulatory Visit (HOSPITAL_BASED_OUTPATIENT_CLINIC_OR_DEPARTMENT_OTHER): Payer: BC Managed Care – PPO | Admitting: Lab

## 2011-05-01 ENCOUNTER — Ambulatory Visit (HOSPITAL_BASED_OUTPATIENT_CLINIC_OR_DEPARTMENT_OTHER): Payer: BC Managed Care – PPO | Admitting: Oncology

## 2011-05-01 VITALS — BP 139/76 | HR 67 | Temp 97.2°F | Ht 71.0 in | Wt 204.8 lb

## 2011-05-01 DIAGNOSIS — F17200 Nicotine dependence, unspecified, uncomplicated: Secondary | ICD-10-CM

## 2011-05-01 DIAGNOSIS — C719 Malignant neoplasm of brain, unspecified: Secondary | ICD-10-CM

## 2011-05-01 DIAGNOSIS — G40909 Epilepsy, unspecified, not intractable, without status epilepticus: Secondary | ICD-10-CM

## 2011-05-01 DIAGNOSIS — R030 Elevated blood-pressure reading, without diagnosis of hypertension: Secondary | ICD-10-CM

## 2011-05-01 DIAGNOSIS — C713 Malignant neoplasm of parietal lobe: Secondary | ICD-10-CM

## 2011-05-01 LAB — COMPREHENSIVE METABOLIC PANEL
ALT: 35 U/L (ref 0–53)
AST: 24 U/L (ref 0–37)
Albumin: 4.7 g/dL (ref 3.5–5.2)
Alkaline Phosphatase: 106 U/L (ref 39–117)
BUN: 16 mg/dL (ref 6–23)
CO2: 23 mEq/L (ref 19–32)
Calcium: 9.6 mg/dL (ref 8.4–10.5)
Chloride: 105 mEq/L (ref 96–112)
Creatinine, Ser: 1.18 mg/dL (ref 0.50–1.35)
Glucose, Bld: 91 mg/dL (ref 70–99)
Potassium: 4 mEq/L (ref 3.5–5.3)
Sodium: 139 mEq/L (ref 135–145)
Total Bilirubin: 0.6 mg/dL (ref 0.3–1.2)
Total Protein: 6.5 g/dL (ref 6.0–8.3)

## 2011-05-01 LAB — CBC WITH DIFFERENTIAL/PLATELET
BASO%: 0.2 % (ref 0.0–2.0)
Basophils Absolute: 0 10*3/uL (ref 0.0–0.1)
EOS%: 1.8 % (ref 0.0–7.0)
Eosinophils Absolute: 0.1 10*3/uL (ref 0.0–0.5)
HCT: 50.1 % — ABNORMAL HIGH (ref 38.4–49.9)
HGB: 17.7 g/dL — ABNORMAL HIGH (ref 13.0–17.1)
LYMPH%: 16.4 % (ref 14.0–49.0)
MCH: 33.4 pg (ref 27.2–33.4)
MCHC: 35.3 g/dL (ref 32.0–36.0)
MCV: 94.6 fL (ref 79.3–98.0)
MONO#: 0.3 10*3/uL (ref 0.1–0.9)
MONO%: 4.5 % (ref 0.0–14.0)
NEUT#: 5 10*3/uL (ref 1.5–6.5)
NEUT%: 77.1 % — ABNORMAL HIGH (ref 39.0–75.0)
Platelets: 176 10*3/uL (ref 140–400)
RBC: 5.3 10*6/uL (ref 4.20–5.82)
RDW: 12.5 % (ref 11.0–14.6)
WBC: 6.5 10*3/uL (ref 4.0–10.3)
lymph#: 1.1 10*3/uL (ref 0.9–3.3)

## 2011-05-01 LAB — LACTATE DEHYDROGENASE: LDH: 141 U/L (ref 94–250)

## 2011-05-01 NOTE — Progress Notes (Signed)
Hematology and Oncology Follow Up Visit  Robert Watkins [id number] 1985-11-20 26 y.o. 05/01/2011 7:15 PM   Principle Diagnosis: Encounter Diagnosis  Name Primary?  . 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 Following surgical resection, he received low-dose oral Temodar with concomitant all brain radiation and he currently continues on maintenance Temodar 200 mg per meter squared 5 days each month. He will begin his 10th of 12 planned cycles this Friday, January 11. He is tolerated treatments well with no side effects. He is doing well at this point. Previous depression has resolved. He is in very good spirits. Father accompanies him today for his visit.  Medications: reviewed  Allergies: No Known Allergies  Review of Systems: Constitutional:   Appetite is good Respiratory: No dyspnea or cough Cardiovascular:   No chest pain or palpitation Gastrointestinal: No abdominal Genito-Urinary: Not questioned Musculoskeletal: No bone pain Neurologic: No headache no change in vision no slurred speech no focal weakness no seizures Skin: Remaining ROS negative.  Physical Exam: Blood pressure 139/76, pulse 67, temperature 97.2 F (36.2 C), temperature source Oral, height 5\' 11"  (1.803 m), weight 204 lb 12.8 oz (92.897 kg). Wt Readings from Last 3 Encounters:  05/01/11 204 lb 12.8 oz (92.897 kg)  04/07/11 201 lb 6.4 oz (91.354 kg)  03/07/11 200 lb 12.8 oz (91.082 kg)     General appearance: Well-nourished Caucasian man Head: Normal Neck: Full range of Lymph nodes: No adenopathy Breasts: Lungs: Clear to auscultation resonant to percussion Heart: Regular cardiac rhythm no murmur Abdomen: Soft nontender Extremities: No edema no calf tenderness Vascular: No cyanosis Neurologic: Mental status intact cranial nerves  grossly normal pupils round equal reactive to light optic discs sharp vessels normal no hemorrhage or exudate motor strength 5 over 5 reflexes 2+ symmetric upper body coordination normal finger to finger finger to hand rapid alternating movements all normal gait normal Skin: No rash  Lab Results: Lab Results  Component Value Date   WBC 6.5 05/01/2011   HGB 17.7* 05/01/2011   HCT 50.1* 05/01/2011   MCV 94.6 05/01/2011   PLT 176 05/01/2011     Chemistry      Component Value Date/Time   NA 139 05/01/2011 1401   K 4.0 05/01/2011 1401   CL 105 05/01/2011 1401   CO2 23 05/01/2011 1401   BUN 16 05/01/2011 1401   CREATININE 1.18 05/01/2011 1401      Component Value Date/Time   CALCIUM 9.6 05/01/2011 1401   ALKPHOS 106 05/01/2011 1401   AST 24 05/01/2011 1401   ALT 35 05/01/2011 1401   BILITOT 0.6 05/01/2011 1401       Radiological Studies: Shows stable postoperative changes and no evidence for new or progressive tumor   Impression and Plan: #1. Anaplastic astrocytoma Continue Temodar cycle #10 beginning this Friday, January 11. Last cycle will begin March 8.  #2 seizure disorder secondary to #1 He has since they'll be a time when he can get off the Keppra. This might be a consideration but not at this time.  #3. Reactive depression resolved  #4. Tobacco addiction. He is trying to get off cigarettes. He is currently using smokeless cigarettes.  #5. 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 Borderline hypertension. Blood pressure currently normal on low dose Norvasc 2.5 mg daily.  Danise Edge; Barnett Abu; Misty Stanley  Michell Heinrich; Annick Desjardins @ [facility];    Levert Feinstein, MD 1/7/20137:15 PM

## 2011-06-23 ENCOUNTER — Ambulatory Visit: Payer: BC Managed Care – PPO | Admitting: Nurse Practitioner

## 2011-06-23 ENCOUNTER — Other Ambulatory Visit: Payer: BC Managed Care – PPO

## 2011-06-27 ENCOUNTER — Telehealth: Payer: Self-pay | Admitting: Oncology

## 2011-06-27 NOTE — Telephone Encounter (Signed)
pt father came by and r/s missed appt on 03/01 to 03/06

## 2011-06-28 ENCOUNTER — Other Ambulatory Visit (HOSPITAL_BASED_OUTPATIENT_CLINIC_OR_DEPARTMENT_OTHER): Payer: BC Managed Care – PPO | Admitting: Lab

## 2011-06-28 ENCOUNTER — Telehealth: Payer: Self-pay | Admitting: Oncology

## 2011-06-28 ENCOUNTER — Ambulatory Visit (HOSPITAL_BASED_OUTPATIENT_CLINIC_OR_DEPARTMENT_OTHER): Payer: BC Managed Care – PPO | Admitting: Nurse Practitioner

## 2011-06-28 VITALS — BP 127/82 | HR 62 | Temp 97.6°F | Ht 71.0 in | Wt 198.9 lb

## 2011-06-28 DIAGNOSIS — I10 Essential (primary) hypertension: Secondary | ICD-10-CM

## 2011-06-28 DIAGNOSIS — C713 Malignant neoplasm of parietal lobe: Secondary | ICD-10-CM

## 2011-06-28 DIAGNOSIS — Z09 Encounter for follow-up examination after completed treatment for conditions other than malignant neoplasm: Secondary | ICD-10-CM

## 2011-06-28 DIAGNOSIS — C719 Malignant neoplasm of brain, unspecified: Secondary | ICD-10-CM

## 2011-06-28 DIAGNOSIS — F341 Dysthymic disorder: Secondary | ICD-10-CM

## 2011-06-28 LAB — CBC WITH DIFFERENTIAL/PLATELET
BASO%: 0.2 % (ref 0.0–2.0)
Basophils Absolute: 0 10*3/uL (ref 0.0–0.1)
EOS%: 1.7 % (ref 0.0–7.0)
Eosinophils Absolute: 0.1 10*3/uL (ref 0.0–0.5)
HCT: 51 % — ABNORMAL HIGH (ref 38.4–49.9)
HGB: 17.6 g/dL — ABNORMAL HIGH (ref 13.0–17.1)
LYMPH%: 18 % (ref 14.0–49.0)
MCH: 32.8 pg (ref 27.2–33.4)
MCHC: 34.6 g/dL (ref 32.0–36.0)
MCV: 94.8 fL (ref 79.3–98.0)
MONO#: 0.3 10*3/uL (ref 0.1–0.9)
MONO%: 4.9 % (ref 0.0–14.0)
NEUT#: 4.3 10*3/uL (ref 1.5–6.5)
NEUT%: 75.2 % — ABNORMAL HIGH (ref 39.0–75.0)
Platelets: 157 10*3/uL (ref 140–400)
RBC: 5.37 10*6/uL (ref 4.20–5.82)
RDW: 13 % (ref 11.0–14.6)
WBC: 5.7 10*3/uL (ref 4.0–10.3)
lymph#: 1 10*3/uL (ref 0.9–3.3)

## 2011-06-28 LAB — COMPREHENSIVE METABOLIC PANEL
ALT: 24 U/L (ref 0–53)
AST: 16 U/L (ref 0–37)
Albumin: 4.6 g/dL (ref 3.5–5.2)
Alkaline Phosphatase: 98 U/L (ref 39–117)
BUN: 12 mg/dL (ref 6–23)
CO2: 22 mEq/L (ref 19–32)
Calcium: 9.6 mg/dL (ref 8.4–10.5)
Chloride: 108 mEq/L (ref 96–112)
Creatinine, Ser: 1.05 mg/dL (ref 0.50–1.35)
Glucose, Bld: 93 mg/dL (ref 70–99)
Potassium: 4.2 mEq/L (ref 3.5–5.3)
Sodium: 141 mEq/L (ref 135–145)
Total Bilirubin: 0.7 mg/dL (ref 0.3–1.2)
Total Protein: 6.4 g/dL (ref 6.0–8.3)

## 2011-06-28 NOTE — Progress Notes (Signed)
OFFICE PROGRESS NOTE  Interval history:  Robert Watkins is a 26 year old man diagnosed with an anaplastic astrocytoma of the left parietal lobe in January of this year. He presented at that time with new onset seizures. He underwent subtotal resection by Dr. Haskell Riling at [facility] on 05/26/2010. He then completed cranial radiation 06/21/10 through 08/01/2010 with concomitant low-dose oral Temodar. He began maintenance Temodar for 5 days each month on 08/22/2010. He completed cycle 11 beginning 06/02/2011. He is seen for scheduled followup.  Robert Watkins denies nausea/vomiting. No mouth sores. He denies headaches. No falls or balance problems. He notes that he occasionally sees a "flash of light". He reported this at the time of his last visit at [facility]. He states being told this may represent seizure activity. He admits he was taking Keppra once a day rather than twice a day. He was instructed by [facility] to increase the Keppra to twice daily. He has been compliant with the twice daily dose.  He denies fever, cough, shortness of breath. No chest pain. No leg swelling or calf pain.  Robert Watkins feels that he has "situational depression coupled with anxiety". He has declined an antidepressant in the past. Over the past weekend he became very anxious and was having "disturbing thoughts". He stated that he considered checking into a "psychiatric hospital". He decided not to do this because he would have to stay for 3 days. He states that he was not having suicidal ideation. He is no longer having the disturbing thoughts. He requests a small prescription for a benzodiazepine to use when he becomes anxious. He has seen Dr. Dub Mikes in the past and feels that he has a good relationship with him. He is interested in seeing Dr. Dub Mikes again.   Objective: Blood pressure 127/82, pulse 62, temperature 97.6 F (36.4 C), temperature source Oral, height 5\' 11"  (1.803 m), weight 198 lb 14.4 oz (90.22 kg).  Pupils equal and reactive to light.  Extraocular movements intact. Oropharynx is without thrush or ulceration. No palpable cervical or supraclavicular lymph nodes. Lungs are clear. No wheezes or rales. Regular cardiac rhythm. Abdomen soft and nontender. No organomegaly. Extremities without edema. Calves soft nontender. Motor strength 5 over 5. Finger to nose and rapid alternating movements intact. Gait normal.  Lab Results: Lab Results  Component Value Date   WBC 5.7 06/28/2011   HGB 17.6* 06/28/2011   HCT 51.0* 06/28/2011   MCV 94.8 06/28/2011   PLT 157 06/28/2011    Chemistry:    Chemistry      Component Value Date/Time   NA 139 05/01/2011 1401   K 4.0 05/01/2011 1401   CL 105 05/01/2011 1401   CO2 23 05/01/2011 1401   BUN 16 05/01/2011 1401   CREATININE 1.18 05/01/2011 1401      Component Value Date/Time   CALCIUM 9.6 05/01/2011 1401   ALKPHOS 106 05/01/2011 1401   AST 24 05/01/2011 1401   ALT 35 05/01/2011 1401   BILITOT 0.6 05/01/2011 1401       Studies/Results: No results found.  Medications: I have reviewed the patient's current medications.  Assessment/Plan:  1. 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 Zachery Conch at [facility] on 05/26/2010. He completed cranial radiation 02/28 through 08/01/2010 with concomitant low-dose oral Temodar. He began maintenance Temodar for 5 days each month on 08/22/2010. He has completed 11 cycles of maintenance Temodar. He is scheduled to begin the 12th and final cycle of Temodar 06/30/2011. 2. Depression/anxiety.  Dr. Cyndie Chime and I both feel it would be in Robert Watkins's best interest to followup with Dr. Dub Mikes. He is agreeable. We contacted Dr. Runell Gess office and arranged for a visit 07/04/2011 at 11 AM. Anette Riedel verbally agreed that he would contact our office, Dr. Runell Gess office or go to the emergency department with recurrence of the feelings he was experiencing over the past weekend and/or suicidal ideation. We did not prescribe a benzodiazepine or  any other medication for him today. 3. Essential hypertension. He continues Norvasc. 4. Seizure disorder secondary to #1. He will continue twice daily Keppra. 5. Probable smokers polycythemia.  Disposition-Robert Watkins will begin the 12th and final cycle of Temodar as planned 06/30/2011. He has followup scheduled at [facility] in April of this year. He will return for a followup visit with Dr. Cyndie Chime in 4 months. He will contact the office in the interim as outlined above or with any other problems.  Patient seen with Dr. Cyndie Chime.   Robert Watkins ANP/GNP-BC

## 2011-06-28 NOTE — Telephone Encounter (Signed)
Gv pt appt for [id number] °

## 2011-10-30 ENCOUNTER — Ambulatory Visit: Payer: BC Managed Care – PPO | Admitting: Oncology

## 2011-10-30 ENCOUNTER — Encounter: Payer: Self-pay | Admitting: Oncology

## 2011-10-30 ENCOUNTER — Other Ambulatory Visit: Payer: BC Managed Care – PPO | Admitting: Lab

## 2011-10-30 NOTE — Progress Notes (Signed)
26 year old man treated for a primary brain cancer with radiation, surgery, chemotherapy, and Avastin. He failed to report for his visit today. She will be rescheduled. This is end of dictation

## 2014-11-07 ENCOUNTER — Emergency Department (HOSPITAL_COMMUNITY)
Admission: EM | Admit: 2014-11-07 | Discharge: 2014-11-08 | Disposition: A | Discharge status: Other Acute Inpatient Hospital | Payer: BLUE CROSS/BLUE SHIELD | Attending: Emergency Medicine | Admitting: Emergency Medicine

## 2014-11-07 ENCOUNTER — Encounter (HOSPITAL_COMMUNITY): Payer: Self-pay | Admitting: Emergency Medicine

## 2014-11-07 DIAGNOSIS — R45851 Suicidal ideations: Secondary | ICD-10-CM | POA: Diagnosis present

## 2014-11-07 DIAGNOSIS — F329 Major depressive disorder, single episode, unspecified: Secondary | ICD-10-CM | POA: Diagnosis not present

## 2014-11-07 DIAGNOSIS — F39 Unspecified mood [affective] disorder: Secondary | ICD-10-CM | POA: Insufficient documentation

## 2014-11-07 DIAGNOSIS — Z85841 Personal history of malignant neoplasm of brain: Secondary | ICD-10-CM | POA: Insufficient documentation

## 2014-11-07 DIAGNOSIS — Z87891 Personal history of nicotine dependence: Secondary | ICD-10-CM | POA: Insufficient documentation

## 2014-11-07 DIAGNOSIS — F12188 Cannabis abuse with other cannabis-induced disorder: Secondary | ICD-10-CM | POA: Insufficient documentation

## 2014-11-07 DIAGNOSIS — Z79899 Other long term (current) drug therapy: Secondary | ICD-10-CM | POA: Insufficient documentation

## 2014-11-07 DIAGNOSIS — Z046 Encounter for general psychiatric examination, requested by authority: Secondary | ICD-10-CM

## 2014-11-07 DIAGNOSIS — F10230 Alcohol dependence with withdrawal, uncomplicated: Secondary | ICD-10-CM

## 2014-11-07 DIAGNOSIS — G40909 Epilepsy, unspecified, not intractable, without status epilepticus: Secondary | ICD-10-CM | POA: Insufficient documentation

## 2014-11-07 DIAGNOSIS — F1994 Other psychoactive substance use, unspecified with psychoactive substance-induced mood disorder: Secondary | ICD-10-CM | POA: Diagnosis not present
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
## 2014-11-07 NOTE — ED Notes (Signed)
Pt arrived to the ED under IVC.  Pt parents are concerned with patients safety as he has stated that due to his brain cancer he has no desire to live any more.  Pt denies any specific plan.  Pt was arrested for DUI and upon learning of this his family committed him

## 2014-11-07 NOTE — ED Notes (Signed)
In Pt belonging bag he has a pair of blue crocs, khaki shorts, orange shirt and in a plastic bag from [employer] he has  Black apple iphone,, 2 lighters, 50 dollars 2 twentyies and one 10 dollar bill, black wallet with visa debit card and 2 blue cross blue shield cards, and paperwork from [employer].

## 2014-11-08 ENCOUNTER — Inpatient Hospital Stay (HOSPITAL_COMMUNITY)
Admission: AD | Admit: 2014-11-08 | Discharge: 2014-11-11 | DRG: 885 | Disposition: A | Discharge status: Home / Self Care | Payer: Federal, State, Local not specified - Other | Source: Intra-hospital | Attending: Psychiatry | Admitting: Psychiatry

## 2014-11-08 ENCOUNTER — Encounter (HOSPITAL_COMMUNITY): Payer: Self-pay | Admitting: *Deleted

## 2014-11-08 DIAGNOSIS — F1994 Other psychoactive substance use, unspecified with psychoactive substance-induced mood disorder: Secondary | ICD-10-CM | POA: Diagnosis present

## 2014-11-08 DIAGNOSIS — F1010 Alcohol abuse, uncomplicated: Secondary | ICD-10-CM | POA: Diagnosis not present

## 2014-11-08 DIAGNOSIS — F10230 Alcohol dependence with withdrawal, uncomplicated: Secondary | ICD-10-CM | POA: Diagnosis not present

## 2014-11-08 DIAGNOSIS — Z87891 Personal history of nicotine dependence: Secondary | ICD-10-CM

## 2014-11-08 DIAGNOSIS — Y906 Blood alcohol level of 120-199 mg/100 ml: Secondary | ICD-10-CM | POA: Diagnosis present

## 2014-11-08 DIAGNOSIS — Z85841 Personal history of malignant neoplasm of brain: Secondary | ICD-10-CM | POA: Diagnosis not present

## 2014-11-08 DIAGNOSIS — F331 Major depressive disorder, recurrent, moderate: Secondary | ICD-10-CM | POA: Diagnosis not present

## 2014-11-08 DIAGNOSIS — F329 Major depressive disorder, single episode, unspecified: Secondary | ICD-10-CM | POA: Diagnosis present

## 2014-11-08 DIAGNOSIS — F419 Anxiety disorder, unspecified: Secondary | ICD-10-CM | POA: Diagnosis not present

## 2014-11-08 LAB — COMPREHENSIVE METABOLIC PANEL
ALT: 47 U/L (ref 17–63)
AST: 28 U/L (ref 15–41)
Albumin: 4.9 g/dL (ref 3.5–5.0)
Alkaline Phosphatase: 93 U/L (ref 38–126)
Anion gap: 12 (ref 5–15)
BUN: 9 mg/dL (ref 6–20)
CO2: 24 mmol/L (ref 22–32)
Calcium: 9.2 mg/dL (ref 8.9–10.3)
Chloride: 106 mmol/L (ref 101–111)
Creatinine, Ser: 1.13 mg/dL (ref 0.61–1.24)
GFR calc Af Amer: >60 mL/min (ref >60)
GFR calc non Af Amer: >60 mL/min (ref >60)
Glucose, Bld: 86 mg/dL (ref 65–99)
Potassium: 3.7 mmol/L (ref 3.5–5.1)
Sodium: 142 mmol/L (ref 135–145)
Total Bilirubin: 0.8 mg/dL (ref 0.3–1.2)
Total Protein: 8 g/dL (ref 6.5–8.1)

## 2014-11-08 LAB — RAPID URINE DRUG SCREEN, HOSP PERFORMED
Amphetamines: NOT DETECTED
Barbiturates: NOT DETECTED
Benzodiazepines: NOT DETECTED
Cocaine: NOT DETECTED
Opiates: NOT DETECTED
Tetrahydrocannabinol: POSITIVE — AB

## 2014-11-08 LAB — SALICYLATE LEVEL: Salicylate Lvl: <4 mg/dL (ref 2.8–30.0)

## 2014-11-08 LAB — CBC
HCT: 52.5 % — ABNORMAL HIGH (ref 39.0–52.0)
Hemoglobin: 19.2 g/dL — ABNORMAL HIGH (ref 13.0–17.0)
MCH: 33.2 pg (ref 26.0–34.0)
MCHC: 36.6 g/dL — ABNORMAL HIGH (ref 30.0–36.0)
MCV: 90.7 fL (ref 78.0–100.0)
Platelets: 205 10*3/uL (ref 150–400)
RBC: 5.79 MIL/uL (ref 4.22–5.81)
RDW: 12.5 % (ref 11.5–15.5)
WBC: 8.7 10*3/uL (ref 4.0–10.5)

## 2014-11-08 LAB — ETHANOL: Alcohol, Ethyl (B): 164 mg/dL — ABNORMAL HIGH (ref <5)

## 2014-11-08 LAB — ACETAMINOPHEN LEVEL: Acetaminophen (Tylenol), Serum: <10 ug/mL — ABNORMAL LOW (ref 10–30)

## 2014-11-08 MED ORDER — LORAZEPAM 1 MG PO TABS
0.0000 mg | ORAL_TABLET | Freq: Two times a day (BID) | ORAL | Status: DC
Start: 2014-11-10 — End: 2014-11-11
  Administered 2014-11-10 (×2): 1 mg via ORAL
  Filled 2014-11-08: qty 1
  Filled 2014-11-08: qty 2

## 2014-11-08 MED ORDER — LORAZEPAM 1 MG PO TABS
1.0000 mg | ORAL_TABLET | Freq: Three times a day (TID) | ORAL | Status: DC | PRN
  Filled 2014-11-08: qty 1

## 2014-11-08 MED ORDER — ZOLPIDEM TARTRATE 5 MG PO TABS: 5.0000 mg | ORAL_TABLET | Freq: Every evening | ORAL | Status: DC | PRN

## 2014-11-08 MED ORDER — ALUM & MAG HYDROXIDE-SIMETH 200-200-20 MG/5ML PO SUSP: 30.0000 mL | ORAL | Status: DC | PRN

## 2014-11-08 MED ORDER — ONDANSETRON HCL 4 MG PO TABS: 4.0000 mg | ORAL_TABLET | Freq: Three times a day (TID) | ORAL | Status: DC | PRN

## 2014-11-08 MED ORDER — LEVETIRACETAM 500 MG PO TABS
500.0000 mg | ORAL_TABLET | Freq: Two times a day (BID) | ORAL | Status: DC
  Administered 2014-11-08 – 2014-11-11 (×6): 500 mg via ORAL
  Filled 2014-11-08: qty 14
  Filled 2014-11-08 (×5): qty 1
  Filled 2014-11-08: qty 14
  Filled 2014-11-08 (×2): qty 1
  Filled 2014-11-08: qty 14
  Filled 2014-11-08: qty 1
  Filled 2014-11-08: qty 14
  Filled 2014-11-08 (×2): qty 1

## 2014-11-08 MED ORDER — AMLODIPINE BESYLATE 2.5 MG PO TABS
2.5000 mg | ORAL_TABLET | Freq: Every day | ORAL | Status: DC
  Administered 2014-11-09 – 2014-11-11 (×3): 2.5 mg via ORAL
  Filled 2014-11-08: qty 1
  Filled 2014-11-08: qty 7
  Filled 2014-11-08 (×2): qty 1
  Filled 2014-11-08: qty 7
  Filled 2014-11-08: qty 1

## 2014-11-08 MED ORDER — VITAMIN B-1 100 MG PO TABS: 100.0000 mg | ORAL_TABLET | Freq: Every day | ORAL | Status: DC

## 2014-11-08 MED ORDER — ENSURE ENLIVE PO LIQD: 237.0000 mL | Freq: Two times a day (BID) | ORAL | Status: DC

## 2014-11-08 MED ORDER — LORAZEPAM 1 MG PO TABS: 1.0000 mg | ORAL_TABLET | Freq: Three times a day (TID) | ORAL | Status: DC | PRN

## 2014-11-08 MED ORDER — PNEUMOCOCCAL VAC POLYVALENT 25 MCG/0.5ML IJ INJ: 0.5000 mL | INJECTION | INTRAMUSCULAR | Status: DC

## 2014-11-08 MED ORDER — LEVETIRACETAM 500 MG PO TABS
500.0000 mg | ORAL_TABLET | Freq: Two times a day (BID) | ORAL | Status: DC
  Administered 2014-11-08 (×2): 500 mg via ORAL
  Filled 2014-11-08 (×3): qty 1

## 2014-11-08 MED ORDER — LORAZEPAM 1 MG PO TABS
0.0000 mg | ORAL_TABLET | Freq: Four times a day (QID) | ORAL | Status: AC
  Administered 2014-11-08 – 2014-11-09 (×3): 1 mg via ORAL
  Filled 2014-11-08 (×3): qty 1

## 2014-11-08 MED ORDER — ZOLPIDEM TARTRATE 5 MG PO TABS
5.0000 mg | ORAL_TABLET | Freq: Every evening | ORAL | Status: DC | PRN
  Administered 2014-11-08 – 2014-11-10 (×3): 5 mg via ORAL
  Filled 2014-11-08 (×3): qty 1

## 2014-11-08 MED ORDER — THIAMINE HCL 100 MG/ML IJ SOLN: 100.0000 mg | Freq: Every day | INTRAMUSCULAR | Status: DC

## 2014-11-08 MED ORDER — ACETAMINOPHEN 325 MG PO TABS: 650.0000 mg | ORAL_TABLET | Freq: Four times a day (QID) | ORAL | Status: DC | PRN

## 2014-11-08 MED ORDER — LORAZEPAM 1 MG PO TABS
0.0000 mg | ORAL_TABLET | Freq: Four times a day (QID) | ORAL | Status: DC
  Administered 2014-11-08: 1 mg via ORAL
  Filled 2014-11-08: qty 1

## 2014-11-08 MED ORDER — AMLODIPINE BESYLATE 2.5 MG PO TABS
2.5000 mg | ORAL_TABLET | Freq: Every day | ORAL | Status: DC
  Administered 2014-11-08: 2.5 mg via ORAL
  Filled 2014-11-08: qty 1

## 2014-11-08 MED ORDER — ACETAMINOPHEN 325 MG PO TABS: 650.0000 mg | ORAL_TABLET | ORAL | Status: DC | PRN

## 2014-11-08 MED ORDER — IBUPROFEN 600 MG PO TABS: 600.0000 mg | ORAL_TABLET | Freq: Three times a day (TID) | ORAL | Status: DC | PRN

## 2014-11-08 MED ORDER — NICOTINE 21 MG/24HR TD PT24
21.0000 mg | MEDICATED_PATCH | Freq: Every day | TRANSDERMAL | Status: DC
  Filled 2014-11-08 (×3): qty 1

## 2014-11-08 MED ORDER — IBUPROFEN 200 MG PO TABS: 600.0000 mg | ORAL_TABLET | Freq: Three times a day (TID) | ORAL | Status: DC | PRN

## 2014-11-08 MED ORDER — NICOTINE 21 MG/24HR TD PT24: 21.0000 mg | MEDICATED_PATCH | Freq: Every day | TRANSDERMAL | Status: DC

## 2014-11-08 MED ORDER — LORAZEPAM 1 MG PO TABS: 0.0000 mg | ORAL_TABLET | Freq: Two times a day (BID) | ORAL | Status: DC

## 2014-11-08 MED ORDER — VITAMIN B-1 100 MG PO TABS
100.0000 mg | ORAL_TABLET | Freq: Every day | ORAL | Status: DC
  Administered 2014-11-09 – 2014-11-11 (×3): 100 mg via ORAL
  Filled 2014-11-08 (×5): qty 1

## 2014-11-08 MED ORDER — MAGNESIUM HYDROXIDE 400 MG/5ML PO SUSP: 30.0000 mL | Freq: Every day | ORAL | Status: DC | PRN

## 2014-11-08 MED ORDER — ALUM & MAG HYDROXIDE-SIMETH 200-200-20 MG/5ML PO SUSP
30.0000 mL | ORAL | Status: DC | PRN
  Administered 2014-11-10 – 2014-11-11 (×2): 30 mL via ORAL
  Filled 2014-11-08 (×2): qty 30
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
## 2014-11-08 NOTE — BH Assessment (Signed)
Assessment completed. Consulted Darlyne Russian, PA-C who recommended that pt be re-evaluated by psychiatry in the morning. Quincy Carnes, PA-C has been informed of the recommendation.
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
## 2014-11-08 NOTE — Tx Team (Signed)
Initial Interdisciplinary Treatment Plan   PATIENT STRESSORS: Educational concerns Health problems Substance abuse Recent loss of significant relationship d/t break up   PATIENT STRENGTHS: Active sense of humor Average or above average intelligence Communication skills Supportive family/friends   PROBLEM LIST: Problem List/Patient Goals Date to be addressed Date deferred Reason deferred Estimated date of resolution  Safety/Suicidal 11/08/2014     Depression 11/08/2014     Substance Abuse 11/08/2014     "Coping skills" 11/08/2014     "I would like to have better self worth." 11/08/2014                              DISCHARGE CRITERIA:  Improved stabilization in mood, thinking, and/or behavior Verbal commitment to aftercare and medication compliance Withdrawal symptoms are absent or subacute and managed without 24-hour nursing intervention  PRELIMINARY DISCHARGE PLAN: Attend 12-step recovery group Outpatient therapy Return to previous living arrangement Return to previous work or school arrangements  PATIENT/FAMIILY INVOLVEMENT: This treatment plan has been presented to and reviewed with the patient, Robert Watkins.  The patient has been given the opportunity to ask questions and make suggestions.  Charlyne Quale A 11/08/2014, 6:41 PM

## 2014-11-08 NOTE — Progress Notes (Signed)
Patient did not attend the evening speaker AA meeting. Pt was notified that group was beginning but remained in bed.   
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
## 2014-11-08 NOTE — Progress Notes (Signed)
D: [name] mom and sister came visiting. Seen patient shedding tears, mom and sister too emotional. Patient was later on day room socializing and interacting with peers. Patient stated "My family is my support system". Patient denies pain, SI,AH/VH at this time. Complaint with treatment/medication regimen. No new complaint.  A: Support and encouragement offered to patient. Patient encouraged to verbalize needs to staff. Due medications given as ordered. Every 15 minutes check for safety maintained. Will continue to monitor patient for safety and stability.  R: Patient remains safe.

## 2014-11-09 ENCOUNTER — Encounter (HOSPITAL_COMMUNITY): Payer: Self-pay | Admitting: Psychiatry

## 2014-11-09 DIAGNOSIS — F331 Major depressive disorder, recurrent, moderate: Principal | ICD-10-CM | POA: Diagnosis present

## 2014-11-09 DIAGNOSIS — F1010 Alcohol abuse, uncomplicated: Secondary | ICD-10-CM

## 2014-11-09 DIAGNOSIS — F419 Anxiety disorder, unspecified: Secondary | ICD-10-CM
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
## 2014-11-09 NOTE — Progress Notes (Signed)
D: Patient presents with flat affect and depressed mood.  He reports depressive symptoms, rating his depression as a 5; hopelessness as a 5; anxiety as a 5.  He reports good sleep and appetite.  He denies SI/HI/AVH.  According to his sister, patient has been making comments about not wanting to live.  He denies any physical pain or withdrawal symptoms.  His goal today is to "keep my mind from dwelling on the wrong  A: Continue to monitor medication management and MD orders.  Safety checks continued every 15 minutes per protocol.  Offer support and encouragement as needed. R: Patient has been isolative to room.

## 2014-11-09 NOTE — [facility] Group Notes (Signed)
[facility] LCSW Group Therapy 11/09/2014  1:15 PM   Type of Therapy: Group Therapy  Participation Level: Did Not Attend. Patient invited to participate but declined.   Tilden Fossa, MSW, [place] Worker [facility] [phone]

## 2014-11-09 NOTE — [facility] Suicide Risk Assessment (Signed)
[facility] Admission Suicide Risk Assessment   Nursing information obtained from:    Demographic factors:    Current Mental Status:    Loss Factors:    Historical Factors:    Risk Reduction Factors:    Total Time spent with patient: 45 minutes Principal Problem: Depression, major, recurrent, moderate Diagnosis:   Patient Active Problem List   Diagnosis Date Noted  . Depression, major, recurrent, moderate [F33.1] 11/09/2014  . Alcohol dependence with uncomplicated withdrawal [D35.701] 11/08/2014  . Substance induced mood disorder [F19.94] 11/08/2014  . Cancer of brain [C71.9] 04/07/2011  . Seizure disorder, secondary [G40.909] 04/07/2011     Continued Clinical Symptoms:  Alcohol Use Disorder Identification Test Final Score (AUDIT): 6 The "Alcohol Use Disorders Identification Test", Guidelines for Use in Primary Care, Second Edition.  World Pharmacologist [facility]). Score between 0-7:  no or low risk or alcohol related problems. Score between 8-15:  moderate risk of alcohol related problems. Score between 16-19:  high risk of alcohol related problems. Score 20 or above:  warrants further diagnostic evaluation for alcohol dependence and treatment.   CLINICAL FACTORS:   Depression:   Comorbid alcohol abuse/dependence Obsessive-Compulsive Disorder Medical Diagnoses and Treatments/Surgeries  Psychiatric Specialty Exam: Physical Exam  ROS  Blood pressure 122/87, pulse 68, temperature 97.7 F (36.5 C), temperature source Oral, resp. rate 16, height 5' 11.5" (1.816 m), weight 93.895 kg (207 lb), SpO2 98 %.Body mass index is 28.47 kg/(m^2).   COGNITIVE FEATURES THAT CONTRIBUTE TO RISK:  None    SUICIDE RISK:   Mild:  Suicidal ideation of limited frequency, intensity, duration, and specificity.  There are no identifiable plans, no associated intent, mild dysphoria and related symptoms, good self-control (both objective and subjective assessment), few other risk factors, and identifiable  protective factors, including available and accessible social support.  PLAN OF CARE: Supportive approach/coping skills                               Depression; reassess for the use of an antidepressant                              Alcohol abuse; work a relapse prevention plan                              CBT/mindfulness  Medical Decision Making:  Review of Psycho-Social Stressors (1), Review or order clinical lab tests (1) and Review of Medication Regimen & Side Effects (2)  I certify that inpatient services furnished can reasonably be expected to improve the patient's condition.   Barack Nicodemus A 11/09/2014, 5:32 PM

## 2014-11-09 NOTE — Progress Notes (Signed)
Recreation Therapy Notes  Date: 07.18.16 Time: 9:30 am Location: [address] Group Room  Group Topic: Stress Management  Goal Area(s) Addresses:  Patient will verbalize importance of using healthy stress management.  Patient will identify positive emotions associated with healthy stress management.   Intervention: Stress Management  Activity :  Guided Automotive engineer.  LRT introduced and educated patients on the stress management technique of guided imagery.  A script was used to deliver the technique to patients.  Patients were asked to follow the script read a loud by LRT to engage in practicing the stress management technique.  Education:  Stress Management, Discharge Planning.   Education Outcome: Acknowledges edcuation/In group clarification offered/Needs additional education  Clinical Observations/Feedback: Patient did not attend group.   Victorino Sparrow, LRT/CTRS         Ria Comment, Torian Thoennes A 11/09/2014 2:05 PM

## 2014-11-09 NOTE — Progress Notes (Signed)
NUTRITION ASSESSMENT  Pt identified as at risk on the Malnutrition Screen Tool  INTERVENTION: 1. Supplements: Continue Ensure Enlive po BID, each supplement provides 350 kcal and 20 grams of protein  NUTRITION DIAGNOSIS: Unintentional weight loss related to sub-optimal intake as evidenced by pt report.   Goal: Pt to meet >/= 90% of their estimated nutrition needs.  Monitor:  PO intake  Assessment:  Pt admitted with depression, substance and ETOH abuse.  Pt reports weight loss of 10 lb d/t poor appetite. Weight history in EPIC does not reflect this. Ensure supplements have been ordered.   Height: Ht Readings from Last 1 Encounters:  11/08/14 5' 11.5" (1.816 m)    Weight: Wt Readings from Last 1 Encounters:  11/08/14 207 lb (93.895 kg)    Weight Hx: Wt Readings from Last 10 Encounters:  11/08/14 207 lb (93.895 kg)  11/07/14 210 lb (95.255 kg)  06/28/11 198 lb 14.4 oz (90.22 kg)  05/01/11 204 lb 12.8 oz (92.897 kg)  04/07/11 201 lb 6.4 oz (91.354 kg)  03/07/11 200 lb 12.8 oz (91.082 kg)    BMI:  Body mass index is 28.47 kg/(m^2). Pt meets criteria for overweight based on current BMI.  Estimated Nutritional Needs: Kcal: 25-30 kcal/kg Protein: > 1 gram protein/kg Fluid: 1 ml/kcal  Diet Order: Diet Heart Room service appropriate?: Yes; Fluid consistency:: Thin Pt is also offered choice of unit snacks mid-morning and mid-afternoon.  Pt is eating as desired.   Lab results and medications reviewed.   Clayton Bibles, MS, RD, LDN Pager: [phone] After Hours Pager: [phone]

## 2014-11-09 NOTE — BHH Group Notes (Signed)
   [facility] LCSW Aftercare Discharge Planning Group Note  11/09/2014  8:45 AM   Participation Quality: Alert, Appropriate and Oriented  Mood/Affect: Appropriate  Depression Rating: 3-4  Anxiety Rating: 3-4  Thoughts of Suicide: Pt denies SI/HI  Will you contract for safety? Yes  Current AVH: Pt denies  Plan for Discharge/Comments: Pt attended discharge planning group and actively participated in group. CSW provided pt with today's workbook. Patient plans to return home to follow up with outpatient services yet to be determined.  Transportation Means: Pt reports access to transportation  Supports: No supports mentioned at this time  Tilden Fossa, MSW, [place] Social Worker [employer] [phone]
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
## 2014-11-10 NOTE — BHH Group Notes (Signed)
[facility] Group Notes:  (Nursing/MHT/Case Management/Adjunct)  Date:  11/10/2014  Time: 0900 am  Type of Therapy:  Psychoeducational Skills  Participation Level:  Did Not Attend   Patient encourage to attend group.  Patient elected to remain in bed.  Zipporah Plants 11/10/2014, 9:51 AM

## 2014-11-10 NOTE — BHH Suicide Risk Assessment (Signed)
[facility] INPATIENT:  Family/Significant Other Suicide Prevention Education  Suicide Prevention Education:  Education Completed; father Zoey Gilkeson [phone],  (name of family member/significant other) has been identified by the patient as the family member/significant other with whom the patient will be residing, and identified as the person(s) who will aid the patient in the event of a mental health crisis (suicidal ideations/suicide attempt).  With written consent from the patient, the family member/significant other has been provided the following suicide prevention education, prior to the and/or following the discharge of the patient.  The suicide prevention education provided includes the following:  Suicide risk factors  Suicide prevention and interventions  National Suicide Hotline telephone number  [facility] assessment telephone number  [facility] Emergency Assistance [place] and/or Residential Mobile Crisis Unit telephone number  Request made of family/significant other to:  Remove weapons (e.g., guns, rifles, knives), all items previously/currently identified as safety concern.    Remove drugs/medications (over-the-counter, prescriptions, illicit drugs), all items previously/currently identified as a safety concern.  The family member/significant other verbalizes understanding of the suicide prevention education information provided.  The family member/significant other agrees to remove the items of safety concern listed above.  Arslan Kier, Casimiro Needle 11/10/2014, 10:54 AM

## 2014-11-10 NOTE — Progress Notes (Signed)
D: Patient states he is sleeping and eating well.  He rates his depression, hopelessness and anxiety as a 2.  He is pleasant and cooperative.  He is guarded and isolative, electing to remain in his room most of the day.  He denies any SI/HI/AVH.  His goal is to "plan to leave."  Patient has a spiritual consult pending.   A: Continue to monitor medication management and MD orders.  Safety checks completed every 15 minutes per protocol.  Offer support and encouragement as needed. R: Patient remains guarded and quiet.

## 2014-11-10 NOTE — Progress Notes (Signed)
Pt reports his day has been ok.  He presents flat and depressed, but smiles during conversation.  He had a visit with his sister and (2) nieces tonight, and says it was a good visit.  He denies SI/HI/AVH.  He denies any withdrawal symptoms at this time.  He says he does not drink everyday and that the drinking episode where he got his DWI was because he was upset about his breakup with his GF.  He does say he has had anxiety today, but not so much this evening.  He spends a lot of time in his room and has minimal interaction with his peers.  He attended evening AA group tonight.  Pt declined the Ativan scheduled for midnight, saying that he did not feel he needs it, but would take the one scheduled in the morning.  His discharge plans are still in process.  Support and encouragement offered.  Pt makes his needs known to staff.  Safety maintained with q15 minute checks.

## 2014-11-10 NOTE — Progress Notes (Signed)
D: Robert Watkins was observed to be sitting in milieu watching TV. He keeps to himself. Very pleasant and a little guarded. He rates anxiety 3, helplessness 2, depression 2. He states "I'm just ready to get out of here". His day was "Ok". He denies SI/HI. Contracts for safety.  A: Encouraged patient to continue to focus on positive behavior and to attend groups as much as he can to learn helpful strategies for coping.  R: Continue to monitor patient for safety. Medication administered as ordered.

## 2014-11-10 NOTE — BHH Group Notes (Signed)
[place] LCSW Group Therapy 11/10/2014 1:15 PM Type of Therapy: Group Therapy Participation Level: Active  Participation Quality: Attentive, Sharing and Supportive  Affect: Depressed and Flat  Cognitive: Alert and Oriented  Insight: Developing/Improving and Engaged  Engagement in Therapy: Developing/Improving and Engaged  Modes of Intervention: Activity, Clarification, Confrontation, Discussion, Education, Exploration, Limit-setting, Orientation, Problem-solving, Rapport Building, Art therapist, Socialization and Support  Summary of Progress/Problems: Patient was attentive and engaged with speaker from [place]. Patient was attentive to speaker while they shared their story of dealing with mental health and overcoming it. Patient expressed interest in their programs and services and received information on their agency. Patient processed ways they can relate to the speaker.   Tilden Fossa, MSW, [place] Worker [facility] [phone]

## 2014-11-10 NOTE — Progress Notes (Signed)
Recreation Therapy Notes  Animal-Assisted Activity (AAA) Program Checklist/Progress Notes Patient Eligibility Criteria Checklist & Daily Group note for Rec Tx Intervention  Date: 07.19.16 Time: 2:45 pm Location: [address]   AAA/T Program Assumption of Risk Form signed by Patient/ or Parent Legal Guardian yes  Patient is free of allergies or sever asthma yes  Patient reports no fear of animals yes  Patient reports no history of cruelty to animalsyes  Patient understands his/her participation is voluntary yes  Patient washes hands before animal contact yes  Patient washes hands after animal contact yes  Education: Hand Washing, Appropriate Animal Interaction   Education Outcome: Acknowledges understanding/In group clarification offered  Clinical Observations/Feedback: Patient did not attend group.   Victorino Sparrow, LRT/CTRS         Victorino Sparrow A 11/10/2014 4:02 PM

## 2014-11-10 NOTE — Progress Notes (Signed)
[facility] MD Progress Note  11/10/2014 8:39 PM Robert Watkins  MRN:  [id number] Subjective:  Robert Watkins continues to work on trying to get his life back together. States he cant quit thinking about his ex GF. He goes back and forth the events what happened what he could have done differently. States that he knows it was not a healthy relationship but he put a lot of energy and effort in that relationship at the expense of fostering his education among other things. He is still trying to understand. He state she regrets his actions when he drank the alcohol Principal Problem: Depression, major, recurrent, moderate Diagnosis:   Patient Active Problem List   Diagnosis Date Noted  . Depression, major, recurrent, moderate [F33.1] 11/09/2014  . Alcohol dependence with uncomplicated withdrawal [L46.503] 11/08/2014  . Substance induced mood disorder [F19.94] 11/08/2014  . Cancer of brain [C71.9] 04/07/2011  . Seizure disorder, secondary [G40.909] 04/07/2011   Total Time spent with patient: 30 minutes   Past Medical History:  Past Medical History  Diagnosis Date  . Cancer of brain 04/07/2011  . Seizure disorder, secondary 04/07/2011  . Depression, reactive 04/07/2011   History reviewed. No pertinent past surgical history. Family History: History reviewed. No pertinent family history. Social History:  History  Alcohol Use  . 2.4 oz/week  . 4 Shots of liquor per week     History  Drug Use  . Yes  . Special: Marijuana    History   Social History  . Marital Status: Single    Spouse Name: N/A  . Number of Children: N/A  . Years of Education: N/A   Social History Main Topics  . Smoking status: Former Research scientist (life sciences)  . Smokeless tobacco: Never Used  . Alcohol Use: 2.4 oz/week    4 Shots of liquor per week  . Drug Use: Yes    Special: Marijuana  . Sexual Activity: Yes   Other Topics Concern  . None   Social History Narrative   Additional History:    Sleep: Fair  Appetite:  Fair   Assessment:    Musculoskeletal: Strength & Muscle Tone: within normal limits Gait & Station: normal Patient leans: normal   Psychiatric Specialty Exam: Physical Exam  Review of Systems  Constitutional: Negative.   HENT: Negative.   Eyes: Negative.   Respiratory: Negative.   Cardiovascular: Negative.   Gastrointestinal: Negative.   Genitourinary: Negative.   Musculoskeletal: Negative.   Skin: Negative.   Neurological: Negative.   Endo/Heme/Allergies: Negative.   Psychiatric/Behavioral: Positive for depression. The patient is nervous/anxious.     Blood pressure 143/95, pulse 85, temperature 97.8 F (36.6 C), temperature source Oral, resp. rate 18, height 5' 11.5" (1.816 m), weight 93.895 kg (207 lb), SpO2 98 %.Body mass index is 28.47 kg/(m^2).  General Appearance: Fairly Groomed  Engineer, water::  Fair  Speech:  Clear and Coherent  Volume:  Normal  Mood:  Anxious  Affect:  anxious worried  Thought Process:  Coherent and Goal Directed  Orientation:  Full (Time, Place, and Person)  Thought Content:  symptoms envents worries concerns  Suicidal Thoughts:  No  Homicidal Thoughts:  No  Memory:  Immediate;   Fair Recent;   Fair Remote;   Fair  Judgement:  Fair  Insight:  Present  Psychomotor Activity:  Restlessness  Concentration:  Fair  Recall:  [employer] of Knowledge:Fair  Language: Fair  Akathisia:  No  Handed:  Right  AIMS (if indicated):     Assets:  Desire  for Improvement Housing Social Support Talents/Skills Vocational/Educational  ADL's:  Intact  Cognition: WNL  Sleep:        Current Medications: Current Facility-Administered Medications  Medication Dose Route Frequency Provider Last Rate Last Dose  . acetaminophen (TYLENOL) tablet 650 mg  650 mg Oral Q6H PRN Patrecia Pour, NP      . alum & mag hydroxide-simeth (MAALOX/MYLANTA) 200-200-20 MG/5ML suspension 30 mL  30 mL Oral PRN Patrecia Pour, NP   30 mL at 11/10/14 0812  . amLODipine (NORVASC) tablet 2.5 mg  2.5  mg Oral Daily Patrecia Pour, NP   2.5 mg at 11/10/14 1497  . feeding supplement (ENSURE ENLIVE) (ENSURE ENLIVE) liquid 237 mL  237 mL Oral BID BM Nicholaus Bloom, MD   237 mL at 11/09/14 1436  . ibuprofen (ADVIL,MOTRIN) tablet 600 mg  600 mg Oral Q8H PRN Patrecia Pour, NP      . levETIRAcetam (KEPPRA) tablet 500 mg  500 mg Oral BID Patrecia Pour, NP   500 mg at 11/10/14 0813  . LORazepam (ATIVAN) tablet 0-4 mg  0-4 mg Oral Q12H Patrecia Pour, NP   1 mg at 11/10/14 0813  . LORazepam (ATIVAN) tablet 1 mg  1 mg Oral Q8H PRN Patrecia Pour, NP      . magnesium hydroxide (MILK OF MAGNESIA) suspension 30 mL  30 mL Oral Daily PRN Patrecia Pour, NP      . ondansetron [facility]) tablet 4 mg  4 mg Oral Q8H PRN Patrecia Pour, NP      . pneumococcal 23 valent vaccine (PNU-IMMUNE) injection 0.5 mL  0.5 mL Intramuscular Tomorrow-1000 Nicholaus Bloom, MD   0.5 mL at 11/09/14 1437  . thiamine (VITAMIN B-1) tablet 100 mg  100 mg Oral Daily Patrecia Pour, NP   100 mg at 11/10/14 0263   Or  . thiamine (B-1) injection 100 mg  100 mg Intravenous Daily Patrecia Pour, NP      . zolpidem (AMBIEN) tablet 5 mg  5 mg Oral QHS PRN Patrecia Pour, NP   5 mg at 11/09/14 2221    Lab Results: No results found for this or any previous visit (from the past 50 hour(s)).  Physical Findings: AIMS: Facial and Oral Movements Muscles of Facial Expression: None, normal Lips and Perioral Area: None, normal Jaw: None, normal Tongue: None, normal,Extremity Movements Upper (arms, wrists, hands, fingers): None, normal Lower (legs, knees, ankles, toes): None, normal, Trunk Movements Neck, shoulders, hips: None, normal, Overall Severity Severity of abnormal movements (highest score from questions above): None, normal Incapacitation due to abnormal movements: None, normal [name] awareness of abnormal movements (rate only patient's report): No Awareness, Dental Status Current problems with teeth and/or dentures?: No Does patient  usually wear dentures?: No  CIWA:  CIWA-Ar Total: 2 COWS:     Treatment Plan Summary: Daily contact with patient to assess and evaluate symptoms and progress in treatment and Medication management Supportive approach/coping skills Alcohol abuse; work a relapse prevention plan Depression; allow to express the feeling associated to the loss of his GF as well as the uncertainty coming from his brain tumor Will continue to assess for the need of an antidepressant Use CBT/mindfulness Medical Decision Making:  Review of Psycho-Social Stressors (1), Review of Medication Regimen & Side Effects (2) and Review of New Medication or Change in Dosage (2)     Shaye Elling A 11/10/2014, 8:39 PM

## 2014-11-10 NOTE — BHH Group Notes (Signed)
[place] Group Notes:  (Nursing/MHT/Case Management/Adjunct)  Date:  11/10/2014  Time:  12:53 PM  Type of Therapy:  Nurse Education  Participation Level:  Did Not Attend  Participation Quality:  did not attend  Affect:  did not attend  Cognitive:  did not attend  Insight:  None  Engagement in Group:  None  Modes of Intervention:  Discussion and Education  Summary of Progress/Problems: The purpose of this group is to orient patient's to the unit's daily activities and to discuss the topic of the day which is Recovery. Patient invited to group but did not attend.   Gaylan Gerold E 11/10/2014, 12:53 PM

## 2014-11-10 NOTE — BHH Suicide Risk Assessment (Deleted)
Suicide Risk Assessment  Discharge Assessment   [facility] Discharge Suicide Risk Assessment   Demographic Factors:  Male  Total Time spent with patient: 45 minutes  Musculoskeletal: Strength & Muscle Tone: within normal limits Gait & Station: normal Patient leans: N/A  Psychiatric Specialty Exam: Physical Exam  Review of Systems  Constitutional: Negative.   HENT: Negative.   Eyes: Negative.   Respiratory: Negative.   Cardiovascular: Negative.   Gastrointestinal: Negative.   Genitourinary: Negative.   Musculoskeletal: Negative.   Skin: Negative.   Neurological: Negative.   Endo/Heme/Allergies: Negative.   Psychiatric/Behavioral: Positive for depression, suicidal ideas and substance abuse.    Blood pressure 138/71, pulse 89, temperature 98.3 F (36.8 C), temperature source Oral, resp. rate 20, height 5\' 11"  (1.803 m), weight 95.255 kg (210 lb), SpO2 96 %.Body mass index is 29.3 kg/(m^2).  General Appearance: Casual  Eye Contact::  Good  Speech:  Normal Rate  Volume:  Normal  Mood:  Anxious  Affect:  Congruent  Thought Process:  Coherent  Orientation:  Full (Time, Place, and Person)  Thought Content:  WDL  Suicidal Thoughts:  No  Homicidal Thoughts:  No  Memory:  Immediate;   Good Recent;   Good Remote;   Good  Judgement:  Fair  Insight:  Good  Psychomotor Activity:  Normal  Concentration:  Good  Recall:  Good  Fund of Knowledge:Good  Language: Good  Akathisia:  No  Handed:  Right  AIMS (if indicated):     Assets:  Leisure Time Physical Health Resilience Social Support  ADL's:  Intact  Cognition: WNL  Sleep:      Has this patient used any form of tobacco in the last 30 days? (Cigarettes, Smokeless Tobacco, Cigars, and/or Pipes) No  Mental Status Per Nursing Assessment::   On Admission:   Alcohol detox/dependence  Current Mental Status by Physician: NA  Loss Factors: NA  Historical Factors: NA  Risk Reduction Factors:   Sense of responsibility to  family, Living with another person, especially a relative and Positive social support  Continued Clinical Symptoms:  None  Cognitive Features That Contribute To Risk:  None    Suicide Risk:  Minimal: No identifiable suicidal ideation.  Patients presenting with no risk factors but with morbid ruminations; may be classified as minimal risk based on the severity of the depressive symptoms  Principal Problem: Substance induced mood disorder Discharge Diagnoses:  Patient Active Problem List   Diagnosis Date Noted  . Alcohol dependence with uncomplicated withdrawal [F79.024] 11/08/2014    Priority: High  . Depression, major, recurrent, moderate [F33.1] 11/09/2014  . Substance induced mood disorder [F19.94] 11/08/2014  . Cancer of brain [C71.9] 04/07/2011  . Seizure disorder, secondary [G40.909] 04/07/2011      Plan Of Care/Follow-up recommendations:  Activity:  as tolerated Diet:  heart healthy diet  Is patient on multiple antipsychotic therapies at discharge:  No   Has Patient had three or more failed trials of antipsychotic monotherapy by history:  No  Recommended Plan for Multiple Antipsychotic Therapies: NA    Thora Scherman, PMH-NP 11/10/2014, 10:27 AM
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
## 2014-11-11 ENCOUNTER — Encounter (HOSPITAL_COMMUNITY): Payer: Self-pay | Admitting: Registered Nurse

## 2014-11-11 MED ORDER — AMLODIPINE BESYLATE 2.5 MG PO TABS: 2.5000 mg | ORAL_TABLET | Freq: Every day | ORAL | Status: DC

## 2014-11-11 MED ORDER — LEVETIRACETAM 500 MG PO TABS: 500.0000 mg | ORAL_TABLET | Freq: Two times a day (BID) | ORAL | Status: DC

## 2014-11-11 NOTE — Progress Notes (Signed)
D: Pt adamantly denies any withdrawal symptoms. Pt was hypertensive but asymptomatic. Pt reports these values as typical readings prior to his admission at [facility]. Writer will review pt's BP in the morning for any changes. Pt is currently denying any SI/HI/AVH. Pt is visible and active within the milieu. A: Writer administered scheduled and prn medications to pt, per MD orders. Continued support and availability as needed was extended to this pt. Staff continue to monitor pt with q33min checks.  R: No adverse drug reactions noted. Pt receptive to treatment. Pt remains safe at this time.

## 2014-11-11 NOTE — Progress Notes (Signed)
  [facility] Adult Case Management Discharge Plan :  Will you be returning to the same living situation after discharge:  Yes,  Patient plans to return home At discharge, do you have transportation home?: Yes,  Patient reports access to transportation Do you have the ability to pay for your medications: Yes,  Patient will be provided with medication samples and prescriptions  Release of information consent forms completed and in the chart;  [name] signature needed at discharge.  Patient to Follow up at: Follow-up Information    [facility] up with [facility] [place], LCSW.   Why:  Please call at discharge to schedule individual therapy appointment.    Contact information:   [address] [phone]      Patient denies SI/HI: Yes,  denies    Safety Planning and Suicide Prevention discussed: Yes,  with patient and father  Have you used any form of tobacco in the last 30 days? (Cigarettes, Smokeless Tobacco, Cigars, and/or Pipes): Yes  Has patient been referred to the Quitline?: Patient refused referral  Benton Tooker, Casimiro Needle 11/11/2014, 10:23 AM

## 2014-11-11 NOTE — Progress Notes (Signed)
D: Per patient self inventory form patient reports he slept good last night with the use of sleep medication. He reports a good appetite, normal energy level, and good concentration. He rates depression 1/10, hopelessness 0/10, anxiety 1/10, all on 01-10 scale, 10 being the worse. He denies SI/HI. Denies AVH. Denies S/S of withdrawal. Patient reports his goal for today is "Seeing through my goals." and that he will meet this goal by "set up to meet with a psychologist. Patient reports he is eager to go home.  A: Special checks q 15 mins in place for safety. Medication administered per MD order (see eMAR). Discharge planning in place. Encouragement and support provided.  R:Safety maintained. Compliant with medication regimen. Will continue to monitor.

## 2014-11-11 NOTE — Progress Notes (Signed)
Recreation Therapy Notes  Date: 07.20.16 Time: 9:30 am Location: [address] Group Room  Group Topic: Stress Management  Goal Area(s) Addresses:  Patient will verbalize importance of using healthy stress management.  Patient will identify positive emotions associated with healthy stress management.   Intervention: Stress Management  Activity :  Progressive Muscle Relaxation.  LRT will introduce and educate patients on the stress management technique of progressive muscle relaxation.  A script was used to present  the technique to the patients.  Patients were asked to follow a long with the script read a loud by the LRT.  Education:  Stress Management, Discharge Planning.   Education Outcome: Acknowledges edcuation/In group clarification offered/Needs additional education  Clinical Observations/Feedback: Patient did not attend group.   Victorino Sparrow, LRT/CTRS         Ria Comment, Nasreen Goedecke A 11/11/2014 10:09 AM
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
## 2014-11-11 NOTE — [facility] Suicide Risk Assessment (Signed)
[facility] Discharge Suicide Risk Assessment   Demographic Factors:  Male, Adolescent or young adult and Caucasian  Total Time spent with patient: 30 minutes  Musculoskeletal: Strength & Muscle Tone: within normal limits Gait & Station: normal Patient leans: normal  Psychiatric Specialty Exam: Physical Exam  ROS  Blood pressure 137/102, pulse 63, temperature 98 F (36.7 C), temperature source Oral, resp. rate 18, height 5' 11.5" (1.816 m), weight 93.895 kg (207 lb), SpO2 98 %.Body mass index is 28.47 kg/(m^2).  General Appearance: Fairly Groomed  Engineer, water::  Fair  Speech:  Clear and [id number]  Volume:  Normal  Mood:  Euthymic  Affect:  Appropriate  Thought Process:  Coherent and Goal Directed  Orientation:  Full (Time, Place, and Person)  Thought Content:  plans as he moves on, relapse prevention plan  Suicidal Thoughts:  No  Homicidal Thoughts:  No  Memory:  Immediate;   Fair Recent;   Fair Remote;   Fair  Judgement:  Fair  Insight:  Present  Psychomotor Activity:  Normal  Concentration:  Fair  Recall:  [employer] [place]  Language: Fair  Akathisia:  No  Handed:  Right  AIMS (if indicated):     Assets:  Desire for Improvement Housing Social Support  Sleep:  Number of Hours: 6.5  Cognition: WNL  ADL's:  Intact   Have you used any form of tobacco in the last 30 days? (Cigarettes, Smokeless Tobacco, Cigars, and/or Pipes): Yes  Has this patient used any form of tobacco in the last 30 days? (Cigarettes, Smokeless Tobacco, Cigars, and/or Pipes) Yes, A prescription for an FDA-approved tobacco cessation medication was offered at discharge and the patient refused  Mental Status Per Nursing Assessment::   On Admission:     Current Mental Status by Physician: In full contact with reality. There are no active SI plans or intent. He is committed to stay abstinent. He is not planing to go back to his job. He is not planning to see his ex-GF again. Will eventually go  back to school. States he is ready to move on, face the DWI etc   Loss Factors: Loss of significant relationship  Historical Factors: NA  Risk Reduction Factors:   Sense of responsibility to family, Living with another person, especially a relative, Positive social support and Positive therapeutic relationship  Continued Clinical Symptoms:  Depression:   Comorbid alcohol abuse/dependence Alcohol/Substance Abuse/Dependencies  Cognitive Features That Contribute To Risk:  None    Suicide Risk:  Minimal: No identifiable suicidal ideation.  Patients presenting with no risk factors but with morbid ruminations; may be classified as minimal risk based on the severity of the depressive symptoms  Principal Problem: Depression, major, recurrent, moderate Discharge Diagnoses:  Patient Active Problem List   Diagnosis Date Noted  . Depression, major, recurrent, moderate [F33.1] 11/09/2014  . Alcohol dependence with uncomplicated withdrawal [C16.606] 11/08/2014  . Substance induced mood disorder [F19.94] 11/08/2014  . Cancer of brain [C71.9] 04/07/2011  . Seizure disorder, secondary [G40.909] 04/07/2011    Follow-up Information    [facility] up with [facility] [place], LCSW On 11/16/2014.   Why:  Therapy appointment on Monday July 25th at 3pm. Please arrive at 2:45pm and  go upstairs and grab a clipboard to complete new patient paperwork.   Contact information:   [address] [phone]      Plan Of Care/Follow-up recommendations:  Activity:  as tolerated Diet:  regular Supportive approach/coping skills Is patient  on multiple antipsychotic therapies at discharge:  No   Has Patient had three or more failed trials of antipsychotic monotherapy by history:  No  Recommended Plan for Multiple Antipsychotic Therapies: NA    Courtnay Petrilla A 11/11/2014, 2:28 PM

## 2014-11-11 NOTE — Progress Notes (Signed)
Patient discharged per MD order. Discharge summary reviewed with pt. Pt verbalizes he understands discharge and outpatient follow up. Medication regimen reviewed with pt, rx and sample medication reviewed. Expresses understanding. Denies SI/HI at discharge. Denies AVH at discharge. Denies physical pain at discharge. No items returned to patient-patient has no locker, all personal items were sent home with family. Pt verbalizes he has no items in locker at facility and signs. Pt family in lobby for discharge. Ambulatory out of facility.

## 2018-02-28 ENCOUNTER — Other Ambulatory Visit: Payer: Self-pay | Admitting: Radiation Therapy

## 2018-02-28 ENCOUNTER — Encounter: Payer: Self-pay | Admitting: *Deleted

## 2018-02-28 ENCOUNTER — Telehealth: Payer: Self-pay | Admitting: *Deleted

## 2018-02-28 NOTE — Telephone Encounter (Signed)
Communication Received from [facility].  New Patient referral:  He is a 32 yo L occipital AA -- who was diagnosed in 2012 s/p surgery, XRT/TMZ and 12 cycles of 5 d temodar - he has been off treatment since 07/2011. We saw him last week and MRI demonstrated progressive FLAIR changes with mass effect compared to 2013. Annick had wanted him to start on daily temodar.  Here's his treatment history - let me know if you're good with this and how I should go about getting records/contact information to you.  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  Stereotactic biopsy of left occipital lesion by Dr. Kristeen Miss in [place]. Pathology demonstrates anaplastic astrocytoma (WHO III)?  ?05/12/2010 Initial Diagnosis?  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  ?05/26/2010 Surgery?  Left parietooccipital craniotomy with tumor resection by Dr. [name]. Pathology confirms anaplastic astrocytoma. ?  ?06/01/2010 Clinical Event-Other?  New patient evaluation at [facility]. Recommend radiation therapy with concurrent Temozolomide followed by twelve cycles of five-day Temozolomide. ?  ?06/13/2010 - 08/01/2010 Radiation?  Radiation with Temodar.?  ?06/13/2010 - 08/01/2010 Chemotherapy?  Temodar 75 mg/m2 with radiation.?  ?08/16/2010 - 08/07/2011 Chemotherapy?  5 day Temodar 200 mg/m2?  ?02/19/2018 Progression?  Progression of non-enhancing tumor compared to 2013. Recommend initiating metronomic daily Temodar 50 mg/m2/day.?  ?  New Patient appointment will be initiated.

## 2018-03-11 ENCOUNTER — Inpatient Hospital Stay: Payer: BLUE CROSS/BLUE SHIELD | Attending: Internal Medicine

## 2018-03-11 DIAGNOSIS — Z9221 Personal history of antineoplastic chemotherapy: Secondary | ICD-10-CM | POA: Insufficient documentation

## 2018-03-11 DIAGNOSIS — Z87891 Personal history of nicotine dependence: Secondary | ICD-10-CM | POA: Insufficient documentation

## 2018-03-11 DIAGNOSIS — G40909 Epilepsy, unspecified, not intractable, without status epilepticus: Secondary | ICD-10-CM | POA: Insufficient documentation

## 2018-03-11 DIAGNOSIS — F329 Major depressive disorder, single episode, unspecified: Secondary | ICD-10-CM | POA: Insufficient documentation

## 2018-03-11 DIAGNOSIS — C719 Malignant neoplasm of brain, unspecified: Secondary | ICD-10-CM | POA: Insufficient documentation

## 2018-03-11 DIAGNOSIS — Z79899 Other long term (current) drug therapy: Secondary | ICD-10-CM | POA: Insufficient documentation

## 2018-03-11 DIAGNOSIS — Z923 Personal history of irradiation: Secondary | ICD-10-CM | POA: Insufficient documentation

## 2018-03-12 ENCOUNTER — Encounter: Payer: Self-pay | Admitting: Internal Medicine

## 2018-03-12 ENCOUNTER — Inpatient Hospital Stay: Payer: BLUE CROSS/BLUE SHIELD | Admitting: Internal Medicine

## 2018-03-12 ENCOUNTER — Telehealth: Payer: Self-pay

## 2018-03-12 VITALS — BP 145/73 | HR 53 | Temp 97.6°F | Resp 14 | Ht 71.0 in | Wt 191.7 lb

## 2018-03-12 DIAGNOSIS — Z79899 Other long term (current) drug therapy: Secondary | ICD-10-CM

## 2018-03-12 DIAGNOSIS — C719 Malignant neoplasm of brain, unspecified: Secondary | ICD-10-CM

## 2018-03-12 DIAGNOSIS — F329 Major depressive disorder, single episode, unspecified: Secondary | ICD-10-CM

## 2018-03-12 DIAGNOSIS — Z87891 Personal history of nicotine dependence: Secondary | ICD-10-CM | POA: Diagnosis not present

## 2018-03-12 DIAGNOSIS — Z923 Personal history of irradiation: Secondary | ICD-10-CM | POA: Diagnosis not present

## 2018-03-12 DIAGNOSIS — G40909 Epilepsy, unspecified, not intractable, without status epilepticus: Secondary | ICD-10-CM

## 2018-03-12 DIAGNOSIS — Z9221 Personal history of antineoplastic chemotherapy: Secondary | ICD-10-CM | POA: Diagnosis not present

## 2018-03-12 MED ORDER — TEMOZOLOMIDE 100 MG PO CAPS: 100.0000 mg | ORAL_CAPSULE | Freq: Every day | ORAL | 0 refills | Status: DC

## 2018-03-12 MED ORDER — ONDANSETRON HCL 8 MG PO TABS: 8.0000 mg | ORAL_TABLET | Freq: Two times a day (BID) | ORAL | 1 refills | Status: DC | PRN

## 2018-03-12 NOTE — Progress Notes (Signed)
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

## 2018-03-12 NOTE — Progress Notes (Signed)
 [facility] [address]  [place], [place] [address] [phone]   New Patient Evaluation  Date of Service: 03/12/18 Patient Name: Robert Watkins Patient MRN: [id number] Patient DOB: 05/18/1985 Provider: Zachary K Vaslow, MD  Identifying Statement:  Robert Watkins is a 32 y.o. male with left occipital anaplastic astrocytoma who presents for initial consultation and evaluation.    Referring Provider: No referring provider defined for this encounter.  Oncologic History: 05/12/2010 Biopsy  Stereotactic biopsy of left occipital lesion by Dr. Henry Elsner in [place]. 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 Allan Friedman. Pathology confirms anaplastic astrocytoma.   06/01/2010 Clinical Event-Other  New patient evaluation at [facility]. Recommend radiation therapy with concurrent Temozolomide followed by twelve cycles of five-day Temozolomide.   06/13/2010 - 08/01/2010 Radiation  Radiation with Temodar.  06/13/2010 - 08/01/2010 Chemotherapy  Temodar 75 mg/m2 with radiation.  08/16/2010 - 08/07/2011 Chemotherapy  5 day Temodar 200 mg/m2  02/19/2018 Progression  Progression of non-enhancing tumor compared to 2013. Recommend initiating metronomic daily Temodar 50 mg/m2/day.  Biomarkers:  MGMT Unknown.  IDH 1/2 Unknown.  EGFR Unknown  TERT Unknown   History of Present Illness: The patient's records from the referring physician were obtained and reviewed and the patient interviewed to confirm this HPI.  Robert Watkins presents today at the request of his primary neuro-oncologist, Annick Desjardins, for chemotherapy evaluation for progressive anaplastic astrocytoma.  He denies any focal neurologic symptoms or recent seizures.  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  Recommendation has been made to resume temodar under local care here at [facility].  Medications: Current Outpatient Medications on File Prior to Visit  Medication Sig Dispense Refill  . levETIRAcetam (KEPPRA) 500 MG tablet Take 1 tablet (500 mg total) by mouth 2 (two) times daily. 60 tablet 0  . amLODipine (NORVASC) 2.5 MG tablet Take 1 tablet (2.5 mg total) by mouth daily. (Patient not taking: Reported on 03/12/2018) 30 tablet 0   No current facility-administered medications on file prior to visit.     Allergies:  Allergies  Allergen Reactions  . Lamotrigine Rash   Past Medical History:  Past Medical History:  Diagnosis Date  . Cancer of brain (HCC) 04/07/2011  . 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 History of chemotherapy 06/13/2010-08/01/2010   42 day Temodar with concurrent Radiation  . History of chemotherapy 08/16/2010-08/07/2011   5 day Temodar 200 mg/m2  . History of radiation therapy 06/13/2010-08/01/2010   [facility]. Recommend radiation therapy with concurrent Temozolomide followed by twelve cycles of five-day Temozolomide. ?  . Seizure disorder, secondary (HCC) 04/07/2011   Past Surgical History:  Past Surgical History:  Procedure Laterality Date  . CRANIECTOMY / CRANIOTOMY FOR EXCISION OF BRAIN TUMOR Left 05/26/2010   Left parietooccipital craniotomy with tumor resection by Dr. Allan Friedman. Pathology confirms anaplastic astrocytoma. ?  . CRANIOTOMY FOR STEREOTACTIC GUIDED SURGERY Left 05/12/2010   Stereotactic biopsy of left occipital lesion by Dr. Henry Elsner in [place]. Pathology demonstrates anaplastic astrocytoma (WHO III)?   Social History:  Social History   Socioeconomic History  . Marital status: Single    Spouse name: Not on file  . Number of children: Not on file  . Years of education: Not on file  . Highest   education level: Not on file  Occupational History  . Not on file    Social Needs  . Financial resource strain: Not on file  . Food insecurity:    Worry: Not on file    Inability: Not on file  . Transportation needs:    Medical: Not on file    Non-medical: Not on file  Tobacco Use  . Smoking status: Former Research scientist (life sciences)  . Smokeless tobacco: Never Used  Substance and Sexual Activity  . Alcohol use: Yes    Alcohol/week: 4.0 standard drinks    Types: 4 Shots of liquor per week  . Drug use: Yes    Types: Marijuana  . Sexual activity: Yes  Lifestyle  . Physical activity:    Days per week: Not on file    Minutes per session: Not on file  . Stress: Not on file  Relationships  . Social connections:    Talks on phone: Not on file    Gets together: Not on file    Attends religious service: Not on file    Active member of club or organization: Not on file    Attends meetings of clubs or organizations: Not on file    Relationship status: Not on file  . Intimate partner violence:    Fear of current or ex partner: Not on file    Emotionally abused: Not on file    Physically abused: Not on file    Forced sexual activity: Not on file  Other Topics Concern  . Not on file  Social History Narrative  . Not on file   Family History: History reviewed. No pertinent family history.  Review of Systems: Constitutional: Denies fevers, chills or abnormal weight loss Eyes: Denies blurriness of vision Ears, nose, mouth, throat, and face: Denies mucositis or sore throat Respiratory: Denies cough, dyspnea or wheezes Cardiovascular: Denies palpitation, chest discomfort or lower extremity swelling Gastrointestinal:  Denies nausea, constipation, diarrhea GU: Denies dysuria or incontinence Skin: Denies abnormal skin rashes Neurological: Per HPI Musculoskeletal: Denies joint pain, back or neck discomfort. No decrease in ROM Behavioral/Psych: Denies anxiety, disturbance in thought content, and mood instability  Physical Exam: Vitals:   03/12/18 1007  BP: (!) 145/73   Pulse: (!) 53  Resp: 14  Temp: 97.6 F (36.4 C)  SpO2: 99%   KPS: 100. General: Alert, cooperative, pleasant, in no acute distress Head: Normal EENT: No conjunctival injection or scleral icterus. Oral mucosa moist Lungs: Resp effort normal Cardiac: Regular rate and rhythm Abdomen: Soft, non-distended abdomen Skin: No rashes cyanosis or petechiae. Extremities: No clubbing or edema  Neurologic Exam: Mental Status: Awake, alert, attentive to examiner. Oriented to self and environment. Language is fluent with intact comprehension.  Cranial Nerves: Visual acuity is grossly normal. Visual fields are full. Extra-ocular movements intact. No ptosis. Face is symmetric, tongue midline. Motor: Tone and bulk are normal. Power is full in both arms and legs. Reflexes are symmetric, no pathologic reflexes present. Intact finger to nose bilaterally Sensory: Intact to light touch and temperature Gait: Normal and tandem gait is normal.   Labs: I have reviewed the data as listed    Component Value Date/Time   NA 142 11/07/2014 2328   K 3.7 11/07/2014 2328   CL 106 11/07/2014 2328   CO2 24 11/07/2014 2328   GLUCOSE 86 11/07/2014 2328   BUN 9 11/07/2014 2328   CREATININE 1.13 11/07/2014 2328   CALCIUM 9.2 11/07/2014 2328   PROT 8.0 11/07/2014 2328   ALBUMIN  4.9 11/07/2014 2328   AST 28 11/07/2014 2328   ALT 47 11/07/2014 2328   ALKPHOS 93 11/07/2014 2328   BILITOT 0.8 11/07/2014 2328   GFRNONAA >60 11/07/2014 2328   GFRAA >60 11/07/2014 2328   Lab Results  Component Value Date   WBC 8.7 11/07/2014   NEUTROABS 4.3 06/28/2011   HGB 19.2 (H) 11/07/2014   HCT 52.5 (H) 11/07/2014   MCV 90.7 11/07/2014   PLT 205 11/07/2014    Imaging: [facility] Clinician Interpretation: I have personally reviewed external CNS images.  My interpretation, in the context of the patient's clinical presentation, is progressive disease   Assessment/Plan 1. Anaplastic astrocytoma (HCC)  Mr. Speers is  clinically stable today.  His MRI demonstrates subtle increase in nodular T2 volume adjacent to resection cavity, with modest mass effect.  This is consistent with recurrent tumor, and recommendation has been made for daily metronomic temozolomide 50mg/m2.  We reviewed side effects including cytopenias, constipation, fatigue.  Chemotherapy should be held for the following:  ANC less than 1,000  Platelets less than 100,000  LFT or creatinine greater than 2x ULN  If clinical concerns/contraindications develop  He should return to clinic in 1 month with labs for evaluation.  Next MRI will be scheduled for 3 months.  We appreciate the opportunity to participate in the care of Demaree Beckom.   All questions were answered. The patient knows to call the clinic with any problems, questions or concerns. No barriers to learning were detected.  The total time spent in the encounter was 45 minutes and more than 50% was on counseling and review of test results   Zachary K Vaslow, MD Medical Director of Neuro-Oncology [facility] 03/12/18 2:49 PM 

## 2018-03-12 NOTE — Telephone Encounter (Signed)
Printed avs and calender of upcoming appointment. Per 11/19 los 

## 2018-03-13 ENCOUNTER — Telehealth: Payer: Self-pay

## 2018-03-13 ENCOUNTER — Telehealth: Payer: Self-pay | Admitting: Pharmacist

## 2018-03-13 DIAGNOSIS — C719 Malignant neoplasm of brain, unspecified: Secondary | ICD-10-CM

## 2018-03-13 NOTE — Telephone Encounter (Signed)
Oral Oncology Patient Advocate Encounter  Received notification from [facility] that prior authorization for Temodar is required.  PA submitted on CoverMyMeds Key [id number] Status is pending  Oral Oncology Clinic will continue to follow.  [place] Patient [place] Phone [phone] Fax [phone]

## 2018-03-13 NOTE — Telephone Encounter (Signed)
Oral Oncology Pharmacist Encounter  Received new prescription for Temodar (temozolomide) for the treatment of anaplastic astrocytoma, now with progression, planned duration until disease progression or unacceptable toxicity.  Original diagnosis in 2012. Patient received concurrent chemoradiation with Temodar at that time, followed by 12 cycles of adjuvant Temodar, completed in 2013  Temodar will be dosed at 50 mg/m2 daily, without breaks  No labs to assess in Epic or Care Everywhere Next lab appointment scheduled for 04/09/18  Current medication list in Epic reviewed, no DDIs with Temodar identified.  Prescription has been e-scribed to the [facility] for benefits analysis and approval.  Oral Oncology Clinic will continue to follow for insurance authorization, copayment issues, initial counseling and start date.  Johny Drilling, PharmD, BCPS, BCOP  03/13/2018 8:29 AM Oral Oncology Clinic [phone]

## 2018-03-14 MED ORDER — TEMOZOLOMIDE 100 MG PO CAPS: 100.0000 mg | ORAL_CAPSULE | Freq: Every day | ORAL | 0 refills | Status: DC

## 2018-03-14 NOTE — Telephone Encounter (Signed)
Oral Chemotherapy Pharmacist Encounter   I spoke with patient for overview of: Temodar (temozolomide) for the treatment of anaplastic astrocytoma, now with progression, planned duration 1 year of Temodar.  Counseled patient on administration, dosing, side effects, monitoring, drug-food interactions, safe handling, storage, and disposal.  Patient will take Temodar 100mg  capsules, 1 capsule (100mg ) by mouth once daily, taken at bedtime and on an empty stomach to decrease nausea and vomiting.  Temodar start date: TBD, pending medication acquisition, likely week of 03/18/2118   Prescription for ondansetron has been e-scribed to [facility] on [address]. Patient will take zofran as needed to manage any nausea and vomiting that may occur. Prophylactic Zofran will not be used at initiation of Temodar.   Adverse effects include but are not limited to: nausea, vomiting, anorexia, GI upset, rash, drug fever, and fatigue. Rare but serious adverse effects of pneumocystis pneumonia and secondary malignancy also discussed.  Patient stated he does remember having to manage nausea during previous experience with Temodar. We discussed different dosing and frequency of Temodar for his current treatment regimen.  PCP prophylaxis will not be initiated at this time, but may be added based on lymphocyte count in the future.  Reviewed with patient importance of keeping a medication schedule and plan for any missed doses.  Mr. Beasley voiced understanding and appreciation.   All questions answered. Medication reconciliation performed and medication/allergy list updated.  Patient updated about [facility] as dispensing pharmacy for Temodar per insurance requirement. He understands it will likely be sometime next week before he hears about copayment and scheduling of his 1st cycle.  Patient knows to call the office with questions or concerns. Oral Oncology Clinic will continue to follow.  Johny Drilling,  PharmD, BCPS, BCOP  03/14/2018  10:44 AM Oral Oncology Clinic [phone]

## 2018-03-14 NOTE — Telephone Encounter (Signed)
Oral Oncology Patient Advocate Encounter  Prior Authorization for Temodar has been approved.    PA# [id number] Effective dates: 03/13/18 through 03/12/19  Oral Oncology Clinic will continue to follow.   [place] Patient [place] Phone [phone] Fax [phone]

## 2018-03-14 NOTE — Telephone Encounter (Signed)
Oral Oncology Pharmacist Encounter  Received notification of insurance approval for Temodar. Test claim at the [place] reveal Temodar prescriptions must be filled at [place] per insurance requirement. Temodar prescription has been e-scribed to the [facility] site in [place], [place]  Johny Drilling, PharmD, BCPS, BCOP  03/14/2018 9:25 AM Oral Oncology Clinic [phone]

## 2018-03-20 ENCOUNTER — Telehealth: Payer: Self-pay | Admitting: Pharmacist

## 2018-03-20 ENCOUNTER — Telehealth: Payer: Self-pay

## 2018-03-20 DIAGNOSIS — C719 Malignant neoplasm of brain, unspecified: Secondary | ICD-10-CM

## 2018-03-20 MED ORDER — TEMOZOLOMIDE 100 MG PO CAPS: 100.0000 mg | ORAL_CAPSULE | Freq: Every day | ORAL | 0 refills | Status: DC

## 2018-03-20 NOTE — Telephone Encounter (Signed)
Oral Oncology Pharmacist Encounter  On following up on patient's Temodar, oral oncology clinic discovered that copayment for first fill of daily metronomic Temodar was ~ $1700 at the mandated dispensing pharmacy for patient's insurance. Patient paid this amount in order to receive his medication.  Oral oncology patient advocate has been researching alternate avenues for Temodar acquisition in an effort to reduce the out-of-pocket cost for patient.  After speaking with patient's insurance, we have discovered that patient is deductible is $7900 of which he has met $4500. His insurance does not cover any cost of his medications until this deductible has been met. After deductible is met, the insurance pays 100%  Temodar prescription has been E scribed to [facility], as they have an in-house patient assistance program, in an effort to reduce the copayment. Patient has been updated about insurance information and possible change in dispensing pharmacy.  We will follow-up with [facility] for Temodar copayment and continue to update patient along the way.  Jesse Mack, PharmD, BCPS, BCOP  03/20/2018 1:28 PM Oral Oncology Clinic [phone] 

## 2018-03-20 NOTE — Telephone Encounter (Signed)
Oral Oncology Patient Advocate Encounter  I called [facility] to check on copay information. The copay is $1700.35. [facility] stated that the patient was aware, asked to be billed and the medicine was delivered to him. I called his insurance company to find out about his Scientist, physiological. [employer] stated he has a $7900 copay and the plan is paying nothing until that deductible is met then [employer] will pay 100%. This year, he has met $4529.20 of that deductible. This will start over 04-24-18. We are extremely limited on copay assistance with Temodar. We will send the RX to [facility] to see if they will be able to assist with this copay.  I called the patient and gave him all of this information and he verbalized understanding and appreciation.  I will continue to update  [place] Patient [place] Phone [phone] Fax [phone]

## 2018-03-20 NOTE — Telephone Encounter (Signed)
Oral Oncology Patient Advocate Encounter  I confirmed with [facility] that Temodar was delivered to the patient on 03/16/18 with a $1700.35 copay.   More copay information in separate encounter.  [place] Patient [place] Phone [phone] Fax [phone]

## 2018-03-27 NOTE — Telephone Encounter (Signed)
Oral Oncology Patient Advocate Encounter  [facility] is the preferred specialty pharmacy for his BCBS plan. I called to see if we could appeal that decision for him so that he can receive assistance with his copay. [employer] faxed me an appeal form, we will work on this and have the patient sign and send back to [employer].  This encounter will continue to be updated  [place] Patient [place] Phone [phone] Fax [phone]

## 2018-04-02 ENCOUNTER — Other Ambulatory Visit: Payer: Self-pay

## 2018-04-02 ENCOUNTER — Ambulatory Visit: Payer: Self-pay | Admitting: Internal Medicine

## 2018-04-02 NOTE — Telephone Encounter (Signed)
Oral Oncology Patient Advocate Encounter  Robert Watkins will come in on Thursday morning at 7:30 and sign the appeal form.  This encounter will be updated until final determination  [place] Patient [place] Phone [phone] Fax [phone]

## 2018-04-02 NOTE — Telephone Encounter (Signed)
Oral Oncology Pharmacist Encounter  Received notification from [facility] that they are unable to fill Robert Watkins's temozolomide prescription as they are not in network with his prescription drug plan.  Appeal to [facility] for site of service will be completed by oral oncology clinic and sent to prescription drug plan in an effort to allow prescription to be filled out out of network pharmacy.  Johny Drilling, PharmD, BCPS, BCOP  04/02/2018 8:21 AM Oral Oncology Clinic [phone]

## 2018-04-04 NOTE — Telephone Encounter (Signed)
Oral Oncology Patient Advocate Encounter  I met [place] in the lobby at 7:30am and he signed this appeal form. I also got Dr. Vaslows signature and faxed the pharmacy location appeal today 04/04/18.  This encounter will be updated until final determination  Krista Layton [facility]HT Specialty Pharmacy Patient Advocate [facility] Phone [phone] Fax [phone]   

## 2018-04-07 ENCOUNTER — Other Ambulatory Visit: Payer: Self-pay | Admitting: Internal Medicine

## 2018-04-07 DIAGNOSIS — C719 Malignant neoplasm of brain, unspecified: Secondary | ICD-10-CM

## 2018-04-09 ENCOUNTER — Inpatient Hospital Stay: Payer: BLUE CROSS/BLUE SHIELD | Attending: Internal Medicine

## 2018-04-09 ENCOUNTER — Encounter: Payer: Self-pay | Admitting: Internal Medicine

## 2018-04-09 ENCOUNTER — Inpatient Hospital Stay (HOSPITAL_BASED_OUTPATIENT_CLINIC_OR_DEPARTMENT_OTHER): Payer: BLUE CROSS/BLUE SHIELD | Admitting: Internal Medicine

## 2018-04-09 ENCOUNTER — Telehealth: Payer: Self-pay

## 2018-04-09 VITALS — BP 137/71 | HR 59 | Temp 98.1°F | Resp 18 | Ht 71.0 in | Wt 190.3 lb

## 2018-04-09 DIAGNOSIS — Z9221 Personal history of antineoplastic chemotherapy: Secondary | ICD-10-CM | POA: Insufficient documentation

## 2018-04-09 DIAGNOSIS — Z923 Personal history of irradiation: Secondary | ICD-10-CM

## 2018-04-09 DIAGNOSIS — Z79899 Other long term (current) drug therapy: Secondary | ICD-10-CM | POA: Diagnosis not present

## 2018-04-09 DIAGNOSIS — Z87891 Personal history of nicotine dependence: Secondary | ICD-10-CM | POA: Insufficient documentation

## 2018-04-09 DIAGNOSIS — C719 Malignant neoplasm of brain, unspecified: Secondary | ICD-10-CM

## 2018-04-09 DIAGNOSIS — G40909 Epilepsy, unspecified, not intractable, without status epilepticus: Secondary | ICD-10-CM | POA: Diagnosis not present

## 2018-04-09 DIAGNOSIS — F329 Major depressive disorder, single episode, unspecified: Secondary | ICD-10-CM | POA: Diagnosis not present

## 2018-04-09 LAB — CBC WITH DIFFERENTIAL (CANCER CENTER ONLY)
Abs Immature Granulocytes: 0.02 10*3/uL (ref 0.00–0.07)
Basophils Absolute: 0 10*3/uL (ref 0.0–0.1)
Basophils Relative: 1 %
Eosinophils Absolute: 0.2 10*3/uL (ref 0.0–0.5)
Eosinophils Relative: 3 %
HCT: 46.8 % (ref 39.0–52.0)
Hemoglobin: 17.1 g/dL — ABNORMAL HIGH (ref 13.0–17.0)
Immature Granulocytes: 0 %
Lymphocytes Relative: 21 %
Lymphs Abs: 1.3 10*3/uL (ref 0.7–4.0)
MCH: 32.6 pg (ref 26.0–34.0)
MCHC: 36.5 g/dL — ABNORMAL HIGH (ref 30.0–36.0)
MCV: 89.1 fL (ref 80.0–100.0)
Monocytes Absolute: 0.3 10*3/uL (ref 0.1–1.0)
Monocytes Relative: 6 %
Neutro Abs: 4.3 10*3/uL (ref 1.7–7.7)
Neutrophils Relative %: 69 %
Platelet Count: 176 10*3/uL (ref 150–400)
RBC: 5.25 MIL/uL (ref 4.22–5.81)
RDW: 11.5 % (ref 11.5–15.5)
WBC Count: 6.2 10*3/uL (ref 4.0–10.5)
nRBC: 0 % (ref 0.0–0.2)

## 2018-04-09 LAB — CMP (CANCER CENTER ONLY)
ALT: 53 U/L — ABNORMAL HIGH (ref 0–44)
AST: 28 U/L (ref 15–41)
Albumin: 4.3 g/dL (ref 3.5–5.0)
Alkaline Phosphatase: 87 U/L (ref 38–126)
Anion gap: 7 (ref 5–15)
BUN: 11 mg/dL (ref 6–20)
CO2: 28 mmol/L (ref 22–32)
Calcium: 9.3 mg/dL (ref 8.9–10.3)
Chloride: 105 mmol/L (ref 98–111)
Creatinine: 1.13 mg/dL (ref 0.61–1.24)
GFR, Est AFR Am: >60 mL/min (ref >60)
GFR, Estimated: >60 mL/min (ref >60)
Glucose, Bld: 90 mg/dL (ref 70–99)
Potassium: 4 mmol/L (ref 3.5–5.1)
Sodium: 140 mmol/L (ref 135–145)
Total Bilirubin: 1 mg/dL (ref 0.3–1.2)
Total Protein: 6.8 g/dL (ref 6.5–8.1)

## 2018-04-09 NOTE — Telephone Encounter (Signed)
PRINTED AVS AND CALENDER OF UPCOMING APPOINTMENT. PER 12/17 LOS

## 2018-04-09 NOTE — Progress Notes (Signed)
[place] at [place], [place] [address] [phone]   Interval Evaluation  Date of Service: 04/09/18 Patient Name: Robert Watkins Patient MRN: [id number] Patient DOB: 1986/04/13 Provider: Ventura Sellers, MD  Identifying Statement:  Robert Watkins is a 32 y.o. 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 Kristeen Miss in [place]. 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 [name]. Pathology confirms anaplastic astrocytoma.   06/01/2010 Clinical Event-Other  [facility] patient evaluation at [facility]. Recommend radiation therapy with concurrent Temozolomide followed by twelve cycles of five-day Temozolomide.   06/13/2010 - 08/01/2010 Radiation  Radiation with Temodar.  06/13/2010 - 08/01/2010 Chemotherapy  Temodar 75 mg/m2 with radiation.  08/16/2010 - 08/07/2011 Chemotherapy  5 day Temodar 200 mg/m2  02/19/2018 Progression  Progression of non-enhancing tumor compared to 2013. Recommend initiating metronomic daily Temodar 50 mg/m2/day.  Interval History:  Robert Watkins presents today for follow up after first cycle of metronomic temodar.  He has tolerated chemo well, no nausea or constipation.  He denies any focal neurologic symptoms or recent seizures.  He remains active, working at a [employer], academic.    Medications: Current Outpatient Medications on File Prior to Visit  Medication Sig Dispense Refill  . levETIRAcetam (KEPPRA) 500 MG tablet Take 1 tablet (500 mg total) by mouth 2 (two) times daily. 60 tablet 0  . ondansetron (ZOFRAN) 8 MG tablet Take 1 tablet (8 mg total) by mouth 2 (two) times daily as needed. Start on the third  day after chemotherapy. 30 tablet 1  . temozolomide (TEMODAR) 100 MG capsule Take 1 capsule (100 mg total) by mouth daily. May take on an empty stomach to decrease nausea & vomiting. 30 capsule 0  . amLODipine (NORVASC) 2.5 MG tablet Take 1 tablet (2.5 mg total) by mouth daily. (Patient not taking: Reported on 03/12/2018) 30 tablet 0   No current facility-administered medications on file prior to visit.     Allergies:  Allergies  Allergen Reactions  . Lamotrigine Rash   Past Medical History:  Past Medical History:  Diagnosis Date  . Cancer of brain ([place]) 04/07/2011  . 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 History of chemotherapy 06/13/2010-08/01/2010   42 day Temodar with concurrent Radiation  . History of chemotherapy 08/16/2010-08/07/2011   5 day Temodar 200 mg/m2  . History of radiation therapy 06/13/2010-08/01/2010   [facility] [place]. Recommend radiation therapy with concurrent Temozolomide followed by twelve cycles of five-day Temozolomide. ?  . Seizure disorder, secondary ([place]) 04/07/2011   Past Surgical History:  Past Surgical History:  Procedure Laterality Date  . CRANIECTOMY / CRANIOTOMY FOR EXCISION OF BRAIN TUMOR Left 05/26/2010   Left parietooccipital craniotomy with tumor resection by Dr. [name]. Pathology confirms anaplastic astrocytoma. ?  . CRANIOTOMY FOR STEREOTACTIC GUIDED SURGERY Left 05/12/2010   Stereotactic biopsy of left occipital lesion by Dr. Kristeen Miss in [place]. Pathology demonstrates anaplastic astrocytoma (WHO III)?   Social History:  Social History   Socioeconomic History  . Marital status: Single    Spouse name: Not on file  . Number of children: Not on file  . Years of education: Not on file  . Highest education level: Not on  file  Occupational History  . Not on file  Social Needs  . Financial resource strain: Not on file  . Food insecurity:    Worry: Not on file    Inability: Not on file  .  Transportation needs:    Medical: Not on file    Non-medical: Not on file  Tobacco Use  . Smoking status: Former Research scientist (life sciences)  . Smokeless tobacco: Never Used  Substance and Sexual Activity  . Alcohol use: Yes    Alcohol/week: 4.0 standard drinks    Types: 4 Shots of liquor per week  . Drug use: Yes    Types: Marijuana  . Sexual activity: Yes  Lifestyle  . Physical activity:    Days per week: Not on file    Minutes per session: Not on file  . Stress: Not on file  Relationships  . Social connections:    Talks on phone: Not on file    Gets together: Not on file    Attends religious service: Not on file    Active member of club or organization: Not on file    Attends meetings of clubs or organizations: Not on file    Relationship status: Not on file  . Intimate partner violence:    Fear of current or ex partner: Not on file    Emotionally abused: Not on file    Physically abused: Not on file    Forced sexual activity: Not on file  Other Topics Concern  . Not on file  Social History Narrative  . Not on file   Family History: History reviewed. No pertinent family history.  Review of Systems: Constitutional: Denies fevers, chills or abnormal weight loss Eyes: Denies blurriness of vision Ears, nose, mouth, throat, and face: Denies mucositis or sore throat Respiratory: Denies cough, dyspnea or wheezes Cardiovascular: Denies palpitation, chest discomfort or lower extremity swelling Gastrointestinal:  Denies nausea, constipation, diarrhea GU: Denies dysuria or incontinence Skin: Denies abnormal skin rashes Neurological: Per HPI Musculoskeletal: Denies joint pain, back or neck discomfort. No decrease in ROM Behavioral/Psych: Denies anxiety, disturbance in thought content, and mood instability  Physical Exam: Vitals:   04/09/18 1200  BP: 137/71  Pulse: (!) 59  Resp: 18  Temp: 98.1 F (36.7 C)  SpO2: 99%   KPS: 100. General: Alert, cooperative, pleasant, in no acute  distress Head: Normal EENT: No conjunctival injection or scleral icterus. Oral mucosa moist Lungs: Resp effort normal Cardiac: Regular rate and rhythm Abdomen: Soft, non-distended abdomen Skin: No rashes cyanosis or petechiae. Extremities: No clubbing or edema  Neurologic Exam: Mental Status: Awake, alert, attentive to examiner. Oriented to self and environment. Language is fluent with intact comprehension.  Cranial Nerves: Visual acuity is grossly normal. Visual fields are full. Extra-ocular movements intact. No ptosis. Face is symmetric, tongue midline. Motor: Tone and bulk are normal. Power is full in both arms and legs. Reflexes are symmetric, no pathologic reflexes present. Intact finger to nose bilaterally Sensory: Intact to light touch and temperature Gait: Normal and tandem gait is normal.   Labs: I have reviewed the data as listed    Component Value Date/Time   NA 140 04/09/2018 1118   K 4.0 04/09/2018 1118   CL 105 04/09/2018 1118   CO2 28 04/09/2018 1118   GLUCOSE 90 04/09/2018 1118   BUN 11 04/09/2018 1118   CREATININE 1.13 04/09/2018 1118   CALCIUM 9.3 04/09/2018 1118   PROT 6.8 04/09/2018 1118   ALBUMIN 4.3 04/09/2018 1118   AST  28 04/09/2018 1118   ALT 53 (H) 04/09/2018 1118   ALKPHOS 87 04/09/2018 1118   BILITOT 1.0 04/09/2018 1118   GFRNONAA >60 04/09/2018 1118   GFRAA >60 04/09/2018 1118   Lab Results  Component Value Date   WBC 6.2 04/09/2018   NEUTROABS 4.3 04/09/2018   HGB 17.1 (H) 04/09/2018   HCT 46.8 04/09/2018   MCV 89.1 04/09/2018   PLT 176 04/09/2018     Assessment/Plan 1. Anaplastic astrocytoma [facility])  Robert Watkins is clinically stable today.  His labs are within normal limits.  We recommended continuing with cycle #2 of daily metronomic Temodar at 50mg /m2.   Chemotherapy should be held for the following:  ANC less than 1,000  Platelets less than 100,000  LFT or creatinine greater than 2x ULN  If clinical  concerns/contraindications develop  He should return to clinic again in 1 month with labs for evaluation.  Next MRI will be scheduled for 2 months.  We appreciate the opportunity to participate in the care of Robert Watkins.   All questions were answered. The patient knows to call the clinic with any problems, questions or concerns. No barriers to learning were detected.  The total time spent in the encounter was 25 minutes and more than 50% was on counseling and review of test results   Ventura Sellers, MD Medical Director of Neuro-Oncology [facility] at [place] 04/09/18 12:09 PM

## 2018-04-10 NOTE — Telephone Encounter (Signed)
Oral Oncology Patient Advocate Encounter  I called [employer] to follow up on this appeal. It is still in processing and this could take up to 30 days for a decision. They will contact us by fax or phone with the decision.  The appeal # for reference is [phone]  This encounter will be updated until final determination  [place] Patient [place] Phone [phone] Fax [phone]

## 2018-04-26 NOTE — Telephone Encounter (Signed)
Oral Oncology Patient Advocate Encounter  We are still waiting on the appeal decision from [place] to get this filled at [facility]. In the meantime we have secured a grant with Patient [employer] [facility]) to cover the copay at [facility] until the grant has been used completely. The grant information is as follows and has been shared with [place].  BIN: [id number] Group: [id number] PCN: [id number] ID: [id number]  I called the patient and gave him the good news, he verbalized understanding and great appreciation.   I called [facility] to see if they could apply to the grant to the last 2 copays of $1700 and leave him with just $100 to pay out of pocket. I was transferred to a few people who then told me that they would have to send this to their billing manager and they would have to take care of it and it would take a few days. I made a note to call them back on 05/01/18 to follow up on this. Patient also verbalized understanding and great appreciation of this.  [place] Patient [place] Phone [phone] Fax [phone]

## 2018-05-10 ENCOUNTER — Other Ambulatory Visit: Payer: Self-pay | Admitting: Internal Medicine

## 2018-05-10 DIAGNOSIS — C719 Malignant neoplasm of brain, unspecified: Secondary | ICD-10-CM

## 2018-05-13 NOTE — Telephone Encounter (Signed)
Oral Oncology Patient Advocate Encounter  I called [facility] to follow up on copay assistance. There is a balance if $1700 on the account. I gave then grant info again and the representative stated she would try to get this billed to the account and the next copay that he will get in the next few days.  I called [employer] to follow up on the Appeal to get the Temodar filled at [facility] and they are closed for the Holiday, I will follow up on this tomorrow.  [place] Patient [place] Phone [phone] Fax [phone]

## 2018-05-16 ENCOUNTER — Telehealth: Payer: Self-pay

## 2018-05-16 NOTE — Telephone Encounter (Signed)
Oral Oncology Patient Advocate Encounter  Patient has a new [employer] and needs a new authorization.   Received notification from [place] that prior authorization for Temodar is required.  PA submitted on CoverMyMeds Key [id number] Status is pending  Oral Oncology Clinic will continue to follow.  [place] Patient [place] Phone [phone] Fax [phone]

## 2018-05-16 NOTE — Telephone Encounter (Signed)
Oral Oncology Patient Advocate Encounter  Prior Authorization for Temodar has been approved.    PA# [id number] Effective dates: 05/16/18 through 05/16/19  Oral Oncology Clinic will continue to follow.   [place] Patient [place] Phone [phone] Fax [phone]

## 2018-05-17 ENCOUNTER — Telehealth: Payer: Self-pay | Admitting: Pharmacist

## 2018-05-17 DIAGNOSIS — C719 Malignant neoplasm of brain, unspecified: Secondary | ICD-10-CM

## 2018-05-17 MED ORDER — TEMOZOLOMIDE 100 MG PO CAPS: 100.0000 mg | ORAL_CAPSULE | Freq: Every day | ORAL | 0 refills | Status: DC

## 2018-05-17 NOTE — Telephone Encounter (Signed)
Oral Chemotherapy Pharmacist Encounter  Follow-Up Form  Spoke with patient today to follow up regarding patient's oral chemotherapy medication: Temodar (temozolomide) for the treatment of anaplastic astrocytoma, now with progression, planned duration until disease progression or unacceptable toxicity.  Original Start date of oral chemotherapy: 03/17/2018  Pt is doing well today  Pt reports 0 tablets/doses of Temodar 100mg  capsules, 1 capsule (100mg ) by mouth once daily, taken at bedtime and on an empty stomach to decrease nausea and vomiting, missed in the last month.  Patient takes his Temodar at about 10:30 PM each evening.  Pt reports the following side effects: None to report  Pertinent labs reviewed: Okay for continued treatment.  Other Issues:   Patient with new prescription insurance plan through [employer]  After insurance authorization was approved, test claim at the [facility] revealed copayment approximately [phone], and he is able to fill at [facility] now as opposed to being mandated to [facility].  Patient with enough Temodar to take through 05/19/2018 and he will pick up his neck supply of Temodar from the [facility] outpatient pharmacy on Monday, 05/20/2018  Copayment will be covered with the use of foundation grant previously secured by oral oncology patient advocate.  Patient informed that once copayment foundation grant funds are exhausted we will triage prescription to [facility] in an effort to get him enrolled with their in-house patient assistance program.  Confirmed follow-up office visits on 05/21/2018 with patient. Patient states he will be following up with [facility] that day as well.  Patient knows to call the office with questions or concerns. Oral Oncology Clinic will continue to follow.  Johny Drilling, PharmD, BCPS, BCOP  05/17/2018 1:55 PM Oral Oncology Clinic [phone]

## 2018-05-17 NOTE — Telephone Encounter (Signed)
Oral Oncology Patient Advocate Encounter  I received a notification from [facility] that the Temodar needed a PA. There is one currently on file so I did some investigating into his benefits and found that he has a new BCBS plan that will allow him to fill at [facility]. I did a Prior Authorization, it was approved and left him with a copay of $650.09. Previously he had been getting Temodar filled with [facility] and his copay was $1700. Patient still has an outstanding balance at [facility] of $1700, I will work with them on billing that copay to his [place] so the patient does not have to pay that balance. The copay at [place] will be covered with his grant as long as there are funds. If his copay is still high when the grant funds have exhausted we can then turn to [facility] pharmacy for financial assistance.  I spoke with the patient and gave him this information, he verbalized understanding and great appreciation.  Temodar will be picked up on 05/20/18 with a $0 copay using the [place].  [place] Patient [place] Phone [phone] Fax [phone]

## 2018-05-20 MED FILL — TEMOZOLOMIDE 100 MG CAPS: 100 | 30 days supply | Qty: 30 | Fill #0

## 2018-05-21 ENCOUNTER — Inpatient Hospital Stay (HOSPITAL_BASED_OUTPATIENT_CLINIC_OR_DEPARTMENT_OTHER): Payer: BLUE CROSS/BLUE SHIELD | Admitting: Internal Medicine

## 2018-05-21 ENCOUNTER — Inpatient Hospital Stay: Payer: BLUE CROSS/BLUE SHIELD | Attending: Internal Medicine

## 2018-05-21 ENCOUNTER — Inpatient Hospital Stay: Payer: BLUE CROSS/BLUE SHIELD | Admitting: Internal Medicine

## 2018-05-21 ENCOUNTER — Inpatient Hospital Stay: Payer: BLUE CROSS/BLUE SHIELD

## 2018-05-21 ENCOUNTER — Telehealth: Payer: Self-pay | Admitting: Internal Medicine

## 2018-05-21 VITALS — BP 134/79 | HR 57 | Temp 98.0°F | Resp 18 | Ht 71.0 in | Wt 187.7 lb

## 2018-05-21 DIAGNOSIS — Z923 Personal history of irradiation: Secondary | ICD-10-CM

## 2018-05-21 DIAGNOSIS — G40909 Epilepsy, unspecified, not intractable, without status epilepticus: Secondary | ICD-10-CM | POA: Diagnosis not present

## 2018-05-21 DIAGNOSIS — Z79899 Other long term (current) drug therapy: Secondary | ICD-10-CM

## 2018-05-21 DIAGNOSIS — F329 Major depressive disorder, single episode, unspecified: Secondary | ICD-10-CM | POA: Diagnosis not present

## 2018-05-21 DIAGNOSIS — Z87891 Personal history of nicotine dependence: Secondary | ICD-10-CM

## 2018-05-21 DIAGNOSIS — C719 Malignant neoplasm of brain, unspecified: Secondary | ICD-10-CM

## 2018-05-21 DIAGNOSIS — Z9221 Personal history of antineoplastic chemotherapy: Secondary | ICD-10-CM | POA: Diagnosis not present

## 2018-05-21 LAB — CBC WITH DIFFERENTIAL (CANCER CENTER ONLY)
Abs Immature Granulocytes: 0.02 10*3/uL (ref 0.00–0.07)
Basophils Absolute: 0 10*3/uL (ref 0.0–0.1)
Basophils Relative: 1 %
Eosinophils Absolute: 0.2 10*3/uL (ref 0.0–0.5)
Eosinophils Relative: 3 %
HCT: 45.4 % (ref 39.0–52.0)
Hemoglobin: 16.7 g/dL (ref 13.0–17.0)
Immature Granulocytes: 0 %
Lymphocytes Relative: 25 %
Lymphs Abs: 1.6 10*3/uL (ref 0.7–4.0)
MCH: 32.2 pg (ref 26.0–34.0)
MCHC: 36.8 g/dL — ABNORMAL HIGH (ref 30.0–36.0)
MCV: 87.6 fL (ref 80.0–100.0)
Monocytes Absolute: 0.3 10*3/uL (ref 0.1–1.0)
Monocytes Relative: 5 %
Neutro Abs: 4.2 10*3/uL (ref 1.7–7.7)
Neutrophils Relative %: 66 %
Platelet Count: 181 10*3/uL (ref 150–400)
RBC: 5.18 MIL/uL (ref 4.22–5.81)
RDW: 11.1 % — ABNORMAL LOW (ref 11.5–15.5)
WBC Count: 6.3 10*3/uL (ref 4.0–10.5)
nRBC: 0 % (ref 0.0–0.2)

## 2018-05-21 LAB — CMP (CANCER CENTER ONLY)
ALT: 50 U/L — ABNORMAL HIGH (ref 0–44)
AST: 23 U/L (ref 15–41)
Albumin: 4.5 g/dL (ref 3.5–5.0)
Alkaline Phosphatase: 81 U/L (ref 38–126)
Anion gap: 9 (ref 5–15)
BUN: 15 mg/dL (ref 6–20)
CO2: 28 mmol/L (ref 22–32)
Calcium: 9.7 mg/dL (ref 8.9–10.3)
Chloride: 106 mmol/L (ref 98–111)
Creatinine: 1.19 mg/dL (ref 0.61–1.24)
GFR, Est AFR Am: >60 mL/min (ref >60)
GFR, Estimated: >60 mL/min (ref >60)
Glucose, Bld: 88 mg/dL (ref 70–99)
Potassium: 4.2 mmol/L (ref 3.5–5.1)
Sodium: 143 mmol/L (ref 135–145)
Total Bilirubin: 1 mg/dL (ref 0.3–1.2)
Total Protein: 7.1 g/dL (ref 6.5–8.1)

## 2018-05-21 MED ORDER — TEMOZOLOMIDE 100 MG PO CAPS: 100.0000 mg | ORAL_CAPSULE | Freq: Every day | ORAL | 0 refills | Status: DC

## 2018-05-21 NOTE — Telephone Encounter (Signed)
Gave avs and calendar. Patient to call [facility] for MRI

## 2018-05-21 NOTE — Progress Notes (Signed)
[place] at [place], [place] [address] [phone]   Interval Evaluation  Date of Service: 05/21/18 Patient Name: Robert Watkins Patient MRN: [id number] Patient DOB: 1986/01/30 Provider: Ventura Sellers, MD  Identifying Statement:  Robert Watkins is a 33 y.o. 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 Kristeen Miss in [place]. 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 [name]. Pathology confirms anaplastic astrocytoma.   06/01/2010 Clinical Event-Other  New patient evaluation at [facility] [place] [facility]. Recommend radiation therapy with concurrent Temozolomide followed by twelve cycles of five-day Temozolomide.   06/13/2010 - 08/01/2010 Radiation  Radiation with Temodar.  06/13/2010 - 08/01/2010 Chemotherapy  Temodar 75 mg/m2 with radiation.  08/16/2010 - 08/07/2011 Chemotherapy  5 day Temodar 200 mg/m2  02/19/2018 Progression  Progression of non-enhancing tumor compared to 2013. Recommend initiating metronomic daily Temodar 50 mg/m2/day.  Interval History:  Robert Watkins presents today for follow up after second cycle of metronomic temodar.  He has tolerated chemo well, no nausea or constipation.  He denies any focal neurologic symptoms or recent seizures.  He remains active, working at a [employer], academic.    Medications: Current Outpatient Medications on File Prior to Visit  Medication Sig Dispense Refill  . amLODipine (NORVASC) 2.5 MG tablet Take 1 tablet (2.5 mg total) by mouth daily. (Patient not taking: Reported on 03/12/2018) 30 tablet 0  . levETIRAcetam (KEPPRA) 500 MG tablet Take 1 tablet (500 mg total) by mouth 2 (two) times  daily. 60 tablet 0  . ondansetron (ZOFRAN) 8 MG tablet Take 1 tablet (8 mg total) by mouth 2 (two) times daily as needed. Start on the third day after chemotherapy. 30 tablet 1  . temozolomide (TEMODAR) 100 MG capsule Take 1 capsule (100 mg total) by mouth daily. May take on an empty stomach or at bedtime to decrease nausea & vomiting. 30 capsule 0   No current facility-administered medications on file prior to visit.     Allergies:  Allergies  Allergen Reactions  . Lamotrigine Rash   Past Medical History:  Past Medical History:  Diagnosis Date  . Cancer of brain ([place]) 04/07/2011  . 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 History of chemotherapy 06/13/2010-08/01/2010   42 day Temodar with concurrent Radiation  . History of chemotherapy 08/16/2010-08/07/2011   5 day Temodar 200 mg/m2  . History of radiation therapy 06/13/2010-08/01/2010   [facility] [place] [facility]. Recommend radiation therapy with concurrent Temozolomide followed by twelve cycles of five-day Temozolomide. ?  . Seizure disorder, secondary ([place]) 04/07/2011   Past Surgical History:  Past Surgical History:  Procedure Laterality Date  . CRANIECTOMY / CRANIOTOMY FOR EXCISION OF BRAIN TUMOR Left 05/26/2010   Left parietooccipital craniotomy with tumor resection by Dr. [name]. Pathology confirms anaplastic astrocytoma. ?  . CRANIOTOMY FOR STEREOTACTIC GUIDED SURGERY Left 05/12/2010   Stereotactic biopsy of left occipital lesion by Dr. Kristeen Miss in [place]. Pathology demonstrates anaplastic astrocytoma (WHO III)?   Social History:  Social History   Socioeconomic History  . Marital status: Single    Spouse name: Not on file  . Number of children: Not on file  . Years of education: Not on file  . Highest education  level: Not on file  Occupational History  . Not on file  Social Needs  . Financial resource strain: Not on file  . Food insecurity:    Worry: Not on file    Inability:  Not on file  . Transportation needs:    Medical: Not on file    Non-medical: Not on file  Tobacco Use  . Smoking status: Former Research scientist (life sciences)  . Smokeless tobacco: Never Used  Substance and Sexual Activity  . Alcohol use: Yes    Alcohol/week: 4.0 standard drinks    Types: 4 Shots of liquor per week  . Drug use: Yes    Types: Marijuana  . Sexual activity: Yes  Lifestyle  . Physical activity:    Days per week: Not on file    Minutes per session: Not on file  . Stress: Not on file  Relationships  . Social connections:    Talks on phone: Not on file    Gets together: Not on file    Attends religious service: Not on file    Active member of club or organization: Not on file    Attends meetings of clubs or organizations: Not on file    Relationship status: Not on file  . Intimate partner violence:    Fear of current or ex partner: Not on file    Emotionally abused: Not on file    Physically abused: Not on file    Forced sexual activity: Not on file  Other Topics Concern  . Not on file  Social History Narrative  . Not on file   Family History: No family history on file.  Review of Systems: Constitutional: Denies fevers, chills or abnormal weight loss Eyes: Denies blurriness of vision Ears, nose, mouth, throat, and face: Denies mucositis or sore throat Respiratory: Denies cough, dyspnea or wheezes Cardiovascular: Denies palpitation, chest discomfort or lower extremity swelling Gastrointestinal:  Denies nausea, constipation, diarrhea GU: Denies dysuria or incontinence Skin: Denies abnormal skin rashes Neurological: Per HPI Musculoskeletal: Denies joint pain, back or neck discomfort. No decrease in ROM Behavioral/Psych: Denies anxiety, disturbance in thought content, and mood instability  Physical Exam: Vitals:   05/21/18 1409  BP: 134/79  Pulse: (!) 57  Resp: 18  Temp: 98 F (36.7 C)  SpO2: 100%   KPS: 100. General: Alert, cooperative, pleasant, in no acute  distress Head: Normal EENT: No conjunctival injection or scleral icterus. Oral mucosa moist Lungs: Resp effort normal Cardiac: Regular rate and rhythm Abdomen: Soft, non-distended abdomen Skin: No rashes cyanosis or petechiae. Extremities: No clubbing or edema  Neurologic Exam: Mental Status: Awake, alert, attentive to examiner. Oriented to self and environment. Language is fluent with intact comprehension.  Cranial Nerves: Visual acuity is grossly normal. Visual fields are full. Extra-ocular movements intact. No ptosis. Face is symmetric, tongue midline. Motor: Tone and bulk are normal. Power is full in both arms and legs. Reflexes are symmetric, no pathologic reflexes present. Intact finger to nose bilaterally Sensory: Intact to light touch and temperature Gait: Normal and tandem gait is normal.   Labs: I have reviewed the data as listed    Component Value Date/Time   NA 140 04/09/2018 1118   K 4.0 04/09/2018 1118   CL 105 04/09/2018 1118   CO2 28 04/09/2018 1118   GLUCOSE 90 04/09/2018 1118   BUN 11 04/09/2018 1118   CREATININE 1.13 04/09/2018 1118   CALCIUM 9.3 04/09/2018 1118   PROT 6.8 04/09/2018 1118   ALBUMIN 4.3 04/09/2018 1118  AST 28 04/09/2018 1118   ALT 53 (H) 04/09/2018 1118   ALKPHOS 87 04/09/2018 1118   BILITOT 1.0 04/09/2018 1118   GFRNONAA >60 04/09/2018 1118   GFRAA >60 04/09/2018 1118   Lab Results  Component Value Date   WBC 6.3 05/21/2018   NEUTROABS 4.2 05/21/2018   HGB 16.7 05/21/2018   HCT 45.4 05/21/2018   MCV 87.6 05/21/2018   PLT 181 05/21/2018     Assessment/Plan 1. Anaplastic astrocytoma [facility])  Mr. Kolenovic is clinically and radiographically stable today (external MRI report).  His labs are within normal limits.  We recommended continuing with cycle #3 of daily metronomic Temodar at 50mg /m2.   Chemotherapy should be held for the following:  ANC less than 1,000  Platelets less than 100,000  LFT or creatinine greater than 2x  ULN  If clinical concerns/contraindications develop  He should return to clinic again in 1 month with labs for evaluation.  Next MRI will be scheduled for 3 months.  We appreciate the opportunity to participate in the care of Robert Watkins.   All questions were answered. The patient knows to call the clinic with any problems, questions or concerns. No barriers to learning were detected.  The total time spent in the encounter was 25 minutes and more than 50% was on counseling and review of test results   Ventura Sellers, MD Medical Director of Neuro-Oncology [facility] at [place] 05/21/18 2:03 PM

## 2018-05-21 NOTE — Telephone Encounter (Signed)
Oral Oncology Patient Advocate Encounter  I spoke with [facility] and they were able to bill the grant to the November and December fills and this left the patient with a credit of $949.56. I informed them that the patient will no longer be getting the medicine filled with them. [facility] will send a check in the amount of $949.56 to the patient.  [place] filled temodar and used $650.09 of the grant and then [facility] used the rest of the grant while billing those copays.  I called the patient and gave him this information, he verbalized understanding and great appreciation.  [place] Patient [place] Phone [phone] Fax [phone]

## 2018-06-05 ENCOUNTER — Telehealth: Payer: Self-pay

## 2018-06-05 NOTE — Telephone Encounter (Signed)
Oral Oncology Patient Advocate Encounter  I was successful at securing a grant with [facility] for $4,000. This will keep the out of pocket expense for Temodar at $0. The grant information is as follows and has been shared with [place].  Approval dates: 05/06/18-05/06/19 ID: [id number]  Group: [id number] BIN: [id number] PCN: [id number]  I called the patient and gave him the good news, he verbalized understanding and great appreciation.   [place] Patient [place] Phone [phone] Fax [phone]

## 2018-06-11 NOTE — Telephone Encounter (Signed)
Oral Oncology Pharmacist Encounter  Appeal was denied. Patient with new insurance plan for calendar year 2020. Temodar acquisition from te [employer], documented in separate encounter.  Johny Drilling, PharmD, BCPS, BCOP  06/11/2018 11:22 AM Oral Oncology Clinic [phone]

## 2018-06-14 MED FILL — TEMOZOLOMIDE 100 MG CAPS: 100 | 30 days supply | Qty: 30 | Fill #0

## 2018-06-14 NOTE — Telephone Encounter (Signed)
Oral Oncology Patient Advocate Encounter  Detrick called and left me a voicemail stating that [facility] had called about filling his medicine and he wasn't sure what to do about that. I called [facility] and they were trying to fill his Temodar in January but I did let them know that he would not be getting it filled with them any longer. They will take him out of their system.  I called Jamario and went over this with him. He also stated that he received a letter from [place] and wanted to know if he needed to do anything with that. I assured him that [facility] has the information they need to bill to his copay and the letter is just the grant information for him to have.  Niki verbalized understanding and great appreciation.  [place] [place] [facility] Phone [phone] Fax [phone]

## 2018-06-18 ENCOUNTER — Telehealth: Payer: Self-pay | Admitting: Internal Medicine

## 2018-06-18 ENCOUNTER — Inpatient Hospital Stay: Payer: BLUE CROSS/BLUE SHIELD | Attending: Internal Medicine | Admitting: Internal Medicine

## 2018-06-18 ENCOUNTER — Inpatient Hospital Stay: Payer: BLUE CROSS/BLUE SHIELD

## 2018-06-18 ENCOUNTER — Encounter: Payer: Self-pay | Admitting: Internal Medicine

## 2018-06-18 ENCOUNTER — Other Ambulatory Visit: Payer: Self-pay

## 2018-06-18 VITALS — BP 126/69 | HR 55 | Temp 97.8°F | Resp 17 | Ht 71.0 in | Wt 188.5 lb

## 2018-06-18 DIAGNOSIS — Z923 Personal history of irradiation: Secondary | ICD-10-CM | POA: Diagnosis not present

## 2018-06-18 DIAGNOSIS — Z79899 Other long term (current) drug therapy: Secondary | ICD-10-CM | POA: Insufficient documentation

## 2018-06-18 DIAGNOSIS — C719 Malignant neoplasm of brain, unspecified: Secondary | ICD-10-CM

## 2018-06-18 DIAGNOSIS — Z9221 Personal history of antineoplastic chemotherapy: Secondary | ICD-10-CM | POA: Diagnosis not present

## 2018-06-18 DIAGNOSIS — Z87891 Personal history of nicotine dependence: Secondary | ICD-10-CM | POA: Insufficient documentation

## 2018-06-18 DIAGNOSIS — C714 Malignant neoplasm of occipital lobe: Secondary | ICD-10-CM

## 2018-06-18 LAB — CBC WITH DIFFERENTIAL (CANCER CENTER ONLY)
Abs Immature Granulocytes: 0.03 10*3/uL (ref 0.00–0.07)
Basophils Absolute: 0 10*3/uL (ref 0.0–0.1)
Basophils Relative: 1 %
Eosinophils Absolute: 0.2 10*3/uL (ref 0.0–0.5)
Eosinophils Relative: 2 %
HCT: 47.4 % (ref 39.0–52.0)
Hemoglobin: 17 g/dL (ref 13.0–17.0)
Immature Granulocytes: 1 %
Lymphocytes Relative: 18 %
Lymphs Abs: 1.1 10*3/uL (ref 0.7–4.0)
MCH: 32.2 pg (ref 26.0–34.0)
MCHC: 35.9 g/dL (ref 30.0–36.0)
MCV: 89.8 fL (ref 80.0–100.0)
Monocytes Absolute: 0.3 10*3/uL (ref 0.1–1.0)
Monocytes Relative: 5 %
Neutro Abs: 4.8 10*3/uL (ref 1.7–7.7)
Neutrophils Relative %: 73 %
Platelet Count: 170 10*3/uL (ref 150–400)
RBC: 5.28 MIL/uL (ref 4.22–5.81)
RDW: 11.6 % (ref 11.5–15.5)
WBC Count: 6.4 10*3/uL (ref 4.0–10.5)
nRBC: 0 % (ref 0.0–0.2)

## 2018-06-18 LAB — CMP (CANCER CENTER ONLY)
ALT: 35 U/L (ref 0–44)
AST: 20 U/L (ref 15–41)
Albumin: 4.4 g/dL (ref 3.5–5.0)
Alkaline Phosphatase: 87 U/L (ref 38–126)
Anion gap: 9 (ref 5–15)
BUN: 17 mg/dL (ref 6–20)
CO2: 27 mmol/L (ref 22–32)
Calcium: 8.7 mg/dL — ABNORMAL LOW (ref 8.9–10.3)
Chloride: 107 mmol/L (ref 98–111)
Creatinine: 1.2 mg/dL (ref 0.61–1.24)
GFR, Est AFR Am: >60 mL/min (ref >60)
GFR, Estimated: >60 mL/min (ref >60)
Glucose, Bld: 95 mg/dL (ref 70–99)
Potassium: 4.6 mmol/L (ref 3.5–5.1)
Sodium: 143 mmol/L (ref 135–145)
Total Bilirubin: 0.6 mg/dL (ref 0.3–1.2)
Total Protein: 7.1 g/dL (ref 6.5–8.1)

## 2018-06-18 MED ORDER — TEMOZOLOMIDE 100 MG PO CAPS: 100.0000 mg | ORAL_CAPSULE | Freq: Every day | ORAL | 0 refills | Status: DC

## 2018-06-18 NOTE — Progress Notes (Signed)
[facility] at [place], [place] [address] [phone]   Interval Evaluation  Date of Service: 06/18/18 Patient Name: Robert Watkins Patient MRN: [id number] Patient DOB: Jul 13, 1985 Provider: Ventura Sellers, MD  Identifying Statement:  Robert Watkins is a 33 y.o. 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 Kristeen Miss in [place]. 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 [name]. Pathology confirms anaplastic astrocytoma.   06/01/2010 Clinical Event-Other  New patient evaluation at [facility] [place] [facility]. Recommend radiation therapy with concurrent Temozolomide followed by twelve cycles of five-day Temozolomide.   06/13/2010 - 08/01/2010 Radiation  Radiation with Temodar.  06/13/2010 - 08/01/2010 Chemotherapy  Temodar 75 mg/m2 with radiation.  08/16/2010 - 08/07/2011 Chemotherapy  5 day Temodar 200 mg/m2  02/19/2018 Progression  Progression of non-enhancing tumor compared to 2013. Recommend initiating metronomic daily Temodar 50 mg/m2/day.  Interval History:  Robert Watkins presents today for follow up after third cycle of metronomic temodar.  He has tolerated chemo well, no nausea or constipation.  He denies any focal neurologic symptoms or recent seizures.  He remains active and continues to work.  Medications: Current Outpatient Medications on File Prior to Visit  Medication Sig Dispense Refill  . levETIRAcetam (KEPPRA) 500 MG tablet Take 1 tablet (500 mg total) by mouth 2 (two) times daily. 60 tablet 0  . temozolomide (TEMODAR) 100 MG capsule Take 1 capsule (100 mg total) by mouth daily. May take on an empty stomach or at  bedtime to decrease nausea & vomiting. 30 capsule 0  . amLODipine (NORVASC) 2.5 MG tablet Take 1 tablet (2.5 mg total) by mouth daily. (Patient not taking: Reported on 06/18/2018) 30 tablet 0  . ondansetron (ZOFRAN) 8 MG tablet Take 1 tablet (8 mg total) by mouth 2 (two) times daily as needed. Start on the third day after chemotherapy. (Patient not taking: Reported on 06/18/2018) 30 tablet 1  . temozolomide (TEMODAR) 100 MG capsule Take 1 capsule (100 mg total) by mouth daily. May take on an empty stomach or at bedtime to decrease nausea & vomiting. (Patient not taking: Reported on 06/18/2018) 30 capsule 0   No current facility-administered medications on file prior to visit.     Allergies:  Allergies  Allergen Reactions  . Lamotrigine Rash   Past Medical History:  Past Medical History:  Diagnosis Date  . Cancer of brain ([place]) 04/07/2011  . 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 History of chemotherapy 06/13/2010-08/01/2010   42 day Temodar with concurrent Radiation  . History of chemotherapy 08/16/2010-08/07/2011   5 day Temodar 200 mg/m2  . History of radiation therapy 06/13/2010-08/01/2010   [facility] [place] [facility]. Recommend radiation therapy with concurrent Temozolomide followed by twelve cycles of five-day Temozolomide. ?  . Seizure disorder, secondary ([place]) 04/07/2011   Past Surgical History:  Past Surgical History:  Procedure Laterality Date  . CRANIECTOMY / CRANIOTOMY FOR EXCISION OF BRAIN TUMOR Left 05/26/2010   Left parietooccipital craniotomy with tumor resection by Dr. [name]. Pathology confirms anaplastic astrocytoma. ?  . CRANIOTOMY FOR STEREOTACTIC GUIDED SURGERY Left 05/12/2010   Stereotactic biopsy of left occipital lesion by Dr. Kristeen Miss in [place]. Pathology demonstrates anaplastic astrocytoma (WHO III)?  Social History:  Social History   Socioeconomic History  . Marital status: Single    Spouse name: Not on file  .  Number of children: Not on file  . Years of education: Not on file  . Highest education level: Not on file  Occupational History  . Not on file  Social Needs  . Financial resource strain: Not on file  . Food insecurity:    Worry: Not on file    Inability: Not on file  . Transportation needs:    Medical: Not on file    Non-medical: Not on file  Tobacco Use  . Smoking status: Former Research scientist (life sciences)  . Smokeless tobacco: Never Used  Substance and Sexual Activity  . Alcohol use: Yes    Alcohol/week: 4.0 standard drinks    Types: 4 Shots of liquor per week  . Drug use: Yes    Types: Marijuana  . Sexual activity: Yes  Lifestyle  . Physical activity:    Days per week: Not on file    Minutes per session: Not on file  . Stress: Not on file  Relationships  . Social connections:    Talks on phone: Not on file    Gets together: Not on file    Attends religious service: Not on file    Active member of club or organization: Not on file    Attends meetings of clubs or organizations: Not on file    Relationship status: Not on file  . Intimate partner violence:    Fear of current or ex partner: Not on file    Emotionally abused: Not on file    Physically abused: Not on file    Forced sexual activity: Not on file  Other Topics Concern  . Not on file  Social History Narrative  . Not on file   Family History: History reviewed. No pertinent family history.  Review of Systems: Constitutional: Denies fevers, chills or abnormal weight loss Eyes: Denies blurriness of vision Ears, nose, mouth, throat, and face: Denies mucositis or sore throat Respiratory: Denies cough, dyspnea or wheezes Cardiovascular: Denies palpitation, chest discomfort or lower extremity swelling Gastrointestinal:  Denies nausea, constipation, diarrhea GU: Denies dysuria or incontinence Skin: Denies abnormal skin rashes Neurological: Per HPI Musculoskeletal: Denies joint pain, back or neck discomfort. No decrease in  ROM Behavioral/Psych: Denies anxiety, disturbance in thought content, and mood instability  Physical Exam: Vitals:   06/18/18 0844  BP: 126/69  Pulse: (!) 55  Resp: 17  Temp: 97.8 F (36.6 C)  SpO2: 99%   KPS: 100. General: Alert, cooperative, pleasant, in no acute distress Head: Normal EENT: No conjunctival injection or scleral icterus. Oral mucosa moist Lungs: Resp effort normal Cardiac: Regular rate and rhythm Abdomen: Soft, non-distended abdomen Skin: No rashes cyanosis or petechiae. Extremities: No clubbing or edema  Neurologic Exam: Mental Status: Awake, alert, attentive to examiner. Oriented to self and environment. Language is fluent with intact comprehension.  Cranial Nerves: Visual acuity is grossly normal. Visual fields are full. Extra-ocular movements intact. No ptosis. Face is symmetric, tongue midline. Motor: Tone and bulk are normal. Power is full in both arms and legs. Reflexes are symmetric, no pathologic reflexes present. Intact finger to nose bilaterally Sensory: Intact to light touch and temperature Gait: Normal and tandem gait is normal.   Labs: I have reviewed the data as listed    Component Value Date/Time   NA 143 05/21/2018 1350   K 4.2 05/21/2018 1350   CL 106 05/21/2018 1350  CO2 28 05/21/2018 1350   GLUCOSE 88 05/21/2018 1350   BUN 15 05/21/2018 1350   CREATININE 1.19 05/21/2018 1350   CALCIUM 9.7 05/21/2018 1350   PROT 7.1 05/21/2018 1350   ALBUMIN 4.5 05/21/2018 1350   AST 23 05/21/2018 1350   ALT 50 (H) 05/21/2018 1350   ALKPHOS 81 05/21/2018 1350   BILITOT 1.0 05/21/2018 1350   GFRNONAA >60 05/21/2018 1350   GFRAA >60 05/21/2018 1350   Lab Results  Component Value Date   WBC 6.4 06/18/2018   NEUTROABS 4.8 06/18/2018   HGB 17.0 06/18/2018   HCT 47.4 06/18/2018   MCV 89.8 06/18/2018   PLT 170 06/18/2018     Assessment/Plan 1. Anaplastic astrocytoma [facility])  Mr. Cuff is clinically and radiographically stable today  (external MRI report).  His labs are within normal limits.  We recommended continuing with cycle #4 of daily metronomic Temodar at 50mg /m2.   Chemotherapy should be held for the following:  ANC less than 1,000  Platelets less than 100,000  LFT or creatinine greater than 2x ULN  If clinical concerns/contraindications develop  He should return to clinic again in 1 month with labs for evaluation.  Next MRI will be scheduled for 2 months.  We appreciate the opportunity to participate in the care of Latrelle Bazar.   All questions were answered. The patient knows to call the clinic with any problems, questions or concerns. No barriers to learning were detected.  The total time spent in the encounter was 25 minutes and more than 50% was on counseling and review of test results   Ventura Sellers, MD Medical Director of Neuro-Oncology [facility] at [place] 06/18/18 9:05 AM

## 2018-06-18 NOTE — Telephone Encounter (Signed)
Gave avs and calendar ° °

## 2018-07-01 ENCOUNTER — Other Ambulatory Visit: Payer: Self-pay | Admitting: Radiation Therapy

## 2018-07-12 MED FILL — TEMOZOLOMIDE 100 MG CAPS: 100 | 30 days supply | Qty: 30 | Fill #0

## 2018-07-16 ENCOUNTER — Telehealth: Payer: Self-pay | Admitting: Internal Medicine

## 2018-07-16 ENCOUNTER — Inpatient Hospital Stay: Payer: BLUE CROSS/BLUE SHIELD | Admitting: Internal Medicine

## 2018-07-16 ENCOUNTER — Inpatient Hospital Stay: Payer: BLUE CROSS/BLUE SHIELD | Attending: Internal Medicine

## 2018-07-16 ENCOUNTER — Other Ambulatory Visit: Payer: Self-pay

## 2018-07-16 DIAGNOSIS — C714 Malignant neoplasm of occipital lobe: Secondary | ICD-10-CM | POA: Diagnosis present

## 2018-07-16 DIAGNOSIS — C719 Malignant neoplasm of brain, unspecified: Secondary | ICD-10-CM

## 2018-07-16 LAB — CBC WITH DIFFERENTIAL (CANCER CENTER ONLY)
Abs Immature Granulocytes: 0.02 10*3/uL (ref 0.00–0.07)
Basophils Absolute: 0.1 10*3/uL (ref 0.0–0.1)
Basophils Relative: 1 %
Eosinophils Absolute: 0.2 10*3/uL (ref 0.0–0.5)
Eosinophils Relative: 3 %
HCT: 49.1 % (ref 39.0–52.0)
Hemoglobin: 17.4 g/dL — ABNORMAL HIGH (ref 13.0–17.0)
Immature Granulocytes: 0 %
Lymphocytes Relative: 19 %
Lymphs Abs: 1.2 10*3/uL (ref 0.7–4.0)
MCH: 31.8 pg (ref 26.0–34.0)
MCHC: 35.4 g/dL (ref 30.0–36.0)
MCV: 89.8 fL (ref 80.0–100.0)
Monocytes Absolute: 0.4 10*3/uL (ref 0.1–1.0)
Monocytes Relative: 6 %
Neutro Abs: 4.4 10*3/uL (ref 1.7–7.7)
Neutrophils Relative %: 71 %
Platelet Count: 195 10*3/uL (ref 150–400)
RBC: 5.47 MIL/uL (ref 4.22–5.81)
RDW: 11.6 % (ref 11.5–15.5)
WBC Count: 6.3 10*3/uL (ref 4.0–10.5)
nRBC: 0 % (ref 0.0–0.2)

## 2018-07-16 LAB — CMP (CANCER CENTER ONLY)
ALT: 82 U/L — ABNORMAL HIGH (ref 0–44)
AST: 42 U/L — ABNORMAL HIGH (ref 15–41)
Albumin: 4.4 g/dL (ref 3.5–5.0)
Alkaline Phosphatase: 97 U/L (ref 38–126)
Anion gap: 10 (ref 5–15)
BUN: 14 mg/dL (ref 6–20)
CO2: 25 mmol/L (ref 22–32)
Calcium: 9.4 mg/dL (ref 8.9–10.3)
Chloride: 105 mmol/L (ref 98–111)
Creatinine: 1.23 mg/dL (ref 0.61–1.24)
GFR, Est AFR Am: >60 mL/min (ref >60)
GFR, Estimated: >60 mL/min (ref >60)
Glucose, Bld: 99 mg/dL (ref 70–99)
Potassium: 4.2 mmol/L (ref 3.5–5.1)
Sodium: 140 mmol/L (ref 135–145)
Total Bilirubin: 1.3 mg/dL — ABNORMAL HIGH (ref 0.3–1.2)
Total Protein: 7.2 g/dL (ref 6.5–8.1)

## 2018-07-16 MED ORDER — TEMOZOLOMIDE 100 MG PO CAPS: 100.0000 mg | ORAL_CAPSULE | Freq: Every day | ORAL | 0 refills | Status: DC

## 2018-07-25 ENCOUNTER — Telehealth: Payer: Self-pay | Admitting: Internal Medicine

## 2018-07-25 NOTE — Telephone Encounter (Signed)
Scheduled appt per 3/31 sch message - pt aware of appt date and time and aware to come in only for labs and Dr. Mickeal Skinner will call him after

## 2018-08-09 MED FILL — TEMOZOLOMIDE 100 MG CAPS: 100 | 28 days supply | Qty: 28 | Fill #0

## 2018-08-16 ENCOUNTER — Other Ambulatory Visit: Payer: Self-pay

## 2018-08-16 ENCOUNTER — Ambulatory Visit
Admission: RE | Admit: 2018-08-16 | Discharge: 2018-08-16 | Disposition: A | Discharge status: Home / Self Care | Payer: BLUE CROSS/BLUE SHIELD | Source: Ambulatory Visit | Attending: Internal Medicine | Admitting: Internal Medicine

## 2018-08-16 DIAGNOSIS — C719 Malignant neoplasm of brain, unspecified: Secondary | ICD-10-CM

## 2018-08-16 MED ORDER — GADOBENATE DIMEGLUMINE 529 MG/ML IV SOLN
18.0000 mL | Freq: Once | INTRAVENOUS | Status: AC | PRN
  Administered 2018-08-16: 18 mL via INTRAVENOUS

## 2018-08-17 ENCOUNTER — Other Ambulatory Visit: Payer: Self-pay

## 2018-08-19 ENCOUNTER — Inpatient Hospital Stay: Payer: BLUE CROSS/BLUE SHIELD | Attending: Internal Medicine | Admitting: Internal Medicine

## 2018-08-19 ENCOUNTER — Telehealth: Payer: Self-pay | Admitting: Internal Medicine

## 2018-08-19 ENCOUNTER — Other Ambulatory Visit: Payer: Self-pay

## 2018-08-19 ENCOUNTER — Inpatient Hospital Stay: Payer: BLUE CROSS/BLUE SHIELD

## 2018-08-19 DIAGNOSIS — C719 Malignant neoplasm of brain, unspecified: Secondary | ICD-10-CM

## 2018-08-19 DIAGNOSIS — C714 Malignant neoplasm of occipital lobe: Secondary | ICD-10-CM | POA: Insufficient documentation

## 2018-08-19 LAB — CMP (CANCER CENTER ONLY)
ALT: 42 U/L (ref 0–44)
AST: 22 U/L (ref 15–41)
Albumin: 4.2 g/dL (ref 3.5–5.0)
Alkaline Phosphatase: 89 U/L (ref 38–126)
Anion gap: 9 (ref 5–15)
BUN: 16 mg/dL (ref 6–20)
CO2: 25 mmol/L (ref 22–32)
Calcium: 9.3 mg/dL (ref 8.9–10.3)
Chloride: 107 mmol/L (ref 98–111)
Creatinine: 1.16 mg/dL (ref 0.61–1.24)
GFR, Est AFR Am: >60 mL/min (ref >60)
GFR, Estimated: >60 mL/min (ref >60)
Glucose, Bld: 94 mg/dL (ref 70–99)
Potassium: 4.6 mmol/L (ref 3.5–5.1)
Sodium: 141 mmol/L (ref 135–145)
Total Bilirubin: 0.5 mg/dL (ref 0.3–1.2)
Total Protein: 7 g/dL (ref 6.5–8.1)

## 2018-08-19 LAB — CBC WITH DIFFERENTIAL (CANCER CENTER ONLY)
Abs Immature Granulocytes: 0.03 10*3/uL (ref 0.00–0.07)
Basophils Absolute: 0.1 10*3/uL (ref 0.0–0.1)
Basophils Relative: 1 %
Eosinophils Absolute: 0.2 10*3/uL (ref 0.0–0.5)
Eosinophils Relative: 3 %
HCT: 48.6 % (ref 39.0–52.0)
Hemoglobin: 17.5 g/dL — ABNORMAL HIGH (ref 13.0–17.0)
Immature Granulocytes: 1 %
Lymphocytes Relative: 21 %
Lymphs Abs: 1.4 10*3/uL (ref 0.7–4.0)
MCH: 32.4 pg (ref 26.0–34.0)
MCHC: 36 g/dL (ref 30.0–36.0)
MCV: 90 fL (ref 80.0–100.0)
Monocytes Absolute: 0.3 10*3/uL (ref 0.1–1.0)
Monocytes Relative: 5 %
Neutro Abs: 4.6 10*3/uL (ref 1.7–7.7)
Neutrophils Relative %: 69 %
Platelet Count: 166 10*3/uL (ref 150–400)
RBC: 5.4 MIL/uL (ref 4.22–5.81)
RDW: 11.8 % (ref 11.5–15.5)
WBC Count: 6.7 10*3/uL (ref 4.0–10.5)
nRBC: 0 % (ref 0.0–0.2)

## 2018-08-19 NOTE — Telephone Encounter (Signed)
No los per 4/27. °

## 2018-08-20 ENCOUNTER — Other Ambulatory Visit: Payer: Self-pay | Admitting: Internal Medicine

## 2018-08-20 DIAGNOSIS — C719 Malignant neoplasm of brain, unspecified: Secondary | ICD-10-CM

## 2018-08-20 MED ORDER — TEMOZOLOMIDE 100 MG PO CAPS: 100.0000 mg | ORAL_CAPSULE | Freq: Every day | ORAL | 0 refills | Status: DC

## 2018-08-21 ENCOUNTER — Inpatient Hospital Stay: Payer: BLUE CROSS/BLUE SHIELD

## 2018-08-22 ENCOUNTER — Telehealth: Payer: Self-pay | Admitting: *Deleted

## 2018-08-22 NOTE — Telephone Encounter (Signed)
DOCUMENTATION:  [facility] follow up completed August 16, 2018.  Follow up due in 1 month labs/md visit.  3 month MRI due

## 2018-08-26 ENCOUNTER — Telehealth: Payer: Self-pay | Admitting: Internal Medicine

## 2018-08-26 NOTE — Telephone Encounter (Signed)
Scheduled appt per sch msg. Called patient. No answer. Left msg.

## 2018-08-30 MED FILL — TEMOZOLOMIDE 100 MG CAPS: 100 | 30 days supply | Qty: 30 | Fill #0

## 2018-09-02 ENCOUNTER — Other Ambulatory Visit: Payer: Self-pay | Admitting: Internal Medicine

## 2018-09-05 ENCOUNTER — Other Ambulatory Visit: Payer: Self-pay | Admitting: Pharmacist

## 2018-09-05 ENCOUNTER — Other Ambulatory Visit: Payer: Self-pay | Admitting: Internal Medicine

## 2018-09-05 ENCOUNTER — Telehealth: Payer: Self-pay | Admitting: *Deleted

## 2018-09-05 DIAGNOSIS — C719 Malignant neoplasm of brain, unspecified: Secondary | ICD-10-CM

## 2018-09-05 NOTE — Telephone Encounter (Signed)
Patient needed clarification on whether he should continue taking Temodar and if he needed follow up.    Dr. Mickeal Skinner called patient today and discussed that no more chemo was needed at this time.  He had taken his 6 month course of treatment.  He does not need to follow up as previously planned for May 28th.  He will have MRI in 3 months and then will follow up with Dr. Donnella Sham @ [facility] for results and further guidance.

## 2018-09-19 ENCOUNTER — Ambulatory Visit: Payer: BLUE CROSS/BLUE SHIELD | Admitting: Internal Medicine

## 2018-09-19 ENCOUNTER — Other Ambulatory Visit: Payer: BLUE CROSS/BLUE SHIELD

## 2018-11-11 ENCOUNTER — Telehealth: Payer: Self-pay | Admitting: Internal Medicine

## 2018-11-11 NOTE — Telephone Encounter (Signed)
Scheduled appt per 7/20 sch message. - pt aware of appt date and time   

## 2018-11-19 ENCOUNTER — Telehealth: Payer: Self-pay | Admitting: *Deleted

## 2018-11-19 NOTE — Telephone Encounter (Signed)
Patient has MRI scheduled this week and follow up with Dr Mickeal Skinner to review results.  He was supposed to have visit with Dr. Imagene Riches @ [facility] but the visit was scheduled before his scan would even be completed.  He is asking if it is even necessary for him to physically go in and see Dr Imagene Riches when he is being followed here.  He doesn't want to terminate the care at [facility] completely and does want their input but just doesn't want to have dual appointments for the same issue and exposure into hospital settings when unnecessary.  Patient is requesting Dr Mickeal Skinner to please advise.

## 2018-12-05 ENCOUNTER — Other Ambulatory Visit: Payer: Self-pay | Admitting: Radiation Therapy

## 2018-12-06 ENCOUNTER — Ambulatory Visit
Admission: RE | Admit: 2018-12-06 | Discharge: 2018-12-06 | Disposition: A | Discharge status: Home / Self Care | Payer: BLUE CROSS/BLUE SHIELD | Source: Ambulatory Visit | Attending: Internal Medicine | Admitting: Internal Medicine

## 2018-12-06 ENCOUNTER — Other Ambulatory Visit: Payer: Self-pay

## 2018-12-06 DIAGNOSIS — C719 Malignant neoplasm of brain, unspecified: Secondary | ICD-10-CM

## 2018-12-06 MED ORDER — GADOBENATE DIMEGLUMINE 529 MG/ML IV SOLN
18.0000 mL | Freq: Once | INTRAVENOUS | Status: AC | PRN
  Administered 2018-12-06: 18:00:00 18 mL via INTRAVENOUS

## 2018-12-09 ENCOUNTER — Other Ambulatory Visit: Payer: Self-pay

## 2018-12-09 ENCOUNTER — Telehealth: Payer: Self-pay | Admitting: Internal Medicine

## 2018-12-09 ENCOUNTER — Inpatient Hospital Stay: Payer: BC Managed Care – PPO | Attending: Internal Medicine | Admitting: Internal Medicine

## 2018-12-09 VITALS — BP 145/76 | HR 57 | Temp 98.2°F | Resp 18 | Ht 71.0 in | Wt 180.8 lb

## 2018-12-09 DIAGNOSIS — G40909 Epilepsy, unspecified, not intractable, without status epilepticus: Secondary | ICD-10-CM | POA: Insufficient documentation

## 2018-12-09 DIAGNOSIS — Z923 Personal history of irradiation: Secondary | ICD-10-CM | POA: Insufficient documentation

## 2018-12-09 DIAGNOSIS — Z87891 Personal history of nicotine dependence: Secondary | ICD-10-CM | POA: Insufficient documentation

## 2018-12-09 DIAGNOSIS — C719 Malignant neoplasm of brain, unspecified: Secondary | ICD-10-CM | POA: Diagnosis not present

## 2018-12-09 DIAGNOSIS — Z9221 Personal history of antineoplastic chemotherapy: Secondary | ICD-10-CM | POA: Diagnosis not present

## 2018-12-09 DIAGNOSIS — C714 Malignant neoplasm of occipital lobe: Secondary | ICD-10-CM | POA: Insufficient documentation

## 2018-12-09 DIAGNOSIS — Z79899 Other long term (current) drug therapy: Secondary | ICD-10-CM | POA: Insufficient documentation

## 2018-12-09 NOTE — Progress Notes (Signed)
[facility] at [place], [place] [address] [phone]   Interval Evaluation  Date of Service: 12/09/18 Patient Name: Robert Watkins Patient MRN: [id number] Patient DOB: 12/30/1985 Provider: Ventura Sellers, MD  Identifying Statement:  Robert Watkins is a 33 y.o. 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 Kristeen Miss in [place]. 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 [name]. Pathology confirms anaplastic astrocytoma.   06/01/2010 Clinical Event-Other  New patient evaluation at [facility] [place] [facility]. Recommend radiation therapy with concurrent Temozolomide followed by twelve cycles of five-day Temozolomide.   06/13/2010 - 08/01/2010 Radiation  Radiation with Temodar.  06/13/2010 - 08/01/2010 Chemotherapy  Temodar 75 mg/m2 with radiation.  08/16/2010 - 08/07/2011 Chemotherapy  5 day Temodar 200 mg/m2  02/19/2018 Progression  Progression of non-enhancing tumor compared to 2013. Recommend initiating metronomic daily Temodar 50 mg/m2/day.  Interval History:  Robert Watkins presents today for follow up after recent MRI brain.  He denies any focal neurologic symptoms or recent seizures.  He remains active and continues to work.  Now has quit smoking and is running again.  Medications: Current Outpatient Medications on File Prior to Visit  Medication Sig Dispense Refill  . levETIRAcetam (KEPPRA) 500 MG tablet Take 1 tablet (500 mg total) by mouth 2 (two) times daily. 60 tablet 0  . amLODipine (NORVASC) 2.5 MG tablet Take 1 tablet (2.5 mg total) by mouth daily. (Patient not taking: Reported on 06/18/2018) 30 tablet 0  . ondansetron  (ZOFRAN) 8 MG tablet Take 1 tablet (8 mg total) by mouth 2 (two) times daily as needed. Start on the third day after chemotherapy. (Patient not taking: Reported on 06/18/2018) 30 tablet 1   No current facility-administered medications on file prior to visit.     Allergies:  Allergies  Allergen Reactions  . Lamotrigine Rash   Past Medical History:  Past Medical History:  Diagnosis Date  . Cancer of brain ([place]) 04/07/2011  . 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 History of chemotherapy 06/13/2010-08/01/2010   42 day Temodar with concurrent Radiation  . History of chemotherapy 08/16/2010-08/07/2011   5 day Temodar 200 mg/m2  . History of radiation therapy 06/13/2010-08/01/2010   [facility] [place] [facility]. Recommend radiation therapy with concurrent Temozolomide followed by twelve cycles of five-day Temozolomide. ?  . Seizure disorder, secondary ([place]) 04/07/2011   Past Surgical History:  Past Surgical History:  Procedure Laterality Date  . CRANIECTOMY / CRANIOTOMY FOR EXCISION OF BRAIN TUMOR Left 05/26/2010   Left parietooccipital craniotomy with tumor resection by Dr. [name]. Pathology confirms anaplastic astrocytoma. ?  . CRANIOTOMY FOR STEREOTACTIC GUIDED SURGERY Left 05/12/2010   Stereotactic biopsy of left occipital lesion by Dr. Kristeen Miss in [place]. Pathology demonstrates anaplastic astrocytoma (WHO III)?   Social History:  Social History   Socioeconomic History  . Marital status: Single    Spouse name: Not on file  . Number of children: Not on file  . Years of education: Not on file  . Highest education level: Not on file  Occupational History  . Not on file  Social Needs  . Financial resource strain: Not on file  . Food insecurity    Worry: Not on  file    Inability: Not on file  . Transportation needs    Medical: Not on file    Non-medical: Not on file  Tobacco Use  . Smoking status: Former Research scientist (life sciences)  . Smokeless tobacco: Never  Used  Substance and Sexual Activity  . Alcohol use: Yes    Alcohol/week: 4.0 standard drinks    Types: 4 Shots of liquor per week  . Drug use: Yes    Types: Marijuana  . Sexual activity: Yes  Lifestyle  . Physical activity    Days per week: Not on file    Minutes per session: Not on file  . Stress: Not on file  Relationships  . Social Herbalist on phone: Not on file    Gets together: Not on file    Attends religious service: Not on file    Active member of club or organization: Not on file    Attends meetings of clubs or organizations: Not on file    Relationship status: Not on file  . Intimate partner violence    Fear of current or ex partner: Not on file    Emotionally abused: Not on file    Physically abused: Not on file    Forced sexual activity: Not on file  Other Topics Concern  . Not on file  Social History Narrative  . Not on file   Family History: No family history on file.  Review of Systems: Constitutional: Denies fevers, chills or abnormal weight loss Eyes: Denies blurriness of vision Ears, nose, mouth, throat, and face: Denies mucositis or sore throat Respiratory: Denies cough, dyspnea or wheezes Cardiovascular: Denies palpitation, chest discomfort or lower extremity swelling Gastrointestinal:  Denies nausea, constipation, diarrhea GU: Denies dysuria or incontinence Skin: Denies abnormal skin rashes Neurological: Per HPI Musculoskeletal: Denies joint pain, back or neck discomfort. No decrease in ROM Behavioral/Psych: Denies anxiety, disturbance in thought content, and mood instability  Physical Exam: Vitals:   12/09/18 1136  BP: (!) 145/76  Pulse: (!) 57  Resp: 18  Temp: 98.2 F (36.8 C)  SpO2: 100%   KPS: 100. General: Alert, cooperative, pleasant, in no acute distress Head: Normal EENT: No conjunctival injection or scleral icterus. Oral mucosa moist Lungs: Resp effort normal Cardiac: Regular rate and rhythm Abdomen: Soft,  non-distended abdomen Skin: No rashes cyanosis or petechiae. Extremities: No clubbing or edema  Neurologic Exam: Mental Status: Awake, alert, attentive to examiner. Oriented to self and environment. Language is fluent with intact comprehension.  Cranial Nerves: Visual acuity is grossly normal. Visual fields are full. Extra-ocular movements intact. No ptosis. Face is symmetric, tongue midline. Motor: Tone and bulk are normal. Power is full in both arms and legs. Reflexes are symmetric, no pathologic reflexes present. Intact finger to nose bilaterally Sensory: Intact to light touch and temperature Gait: Normal and tandem gait is normal.   Labs: I have reviewed the data as listed    Component Value Date/Time   NA 141 08/19/2018 0854   K 4.6 08/19/2018 0854   CL 107 08/19/2018 0854   CO2 25 08/19/2018 0854   GLUCOSE 94 08/19/2018 0854   BUN 16 08/19/2018 0854   CREATININE 1.16 08/19/2018 0854   CALCIUM 9.3 08/19/2018 0854   PROT 7.0 08/19/2018 0854   ALBUMIN 4.2 08/19/2018 0854   AST 22 08/19/2018 0854   ALT 42 08/19/2018 0854   ALKPHOS 89 08/19/2018 0854   BILITOT 0.5 08/19/2018 0854   GFRNONAA >60 08/19/2018 0854   GFRAA >  60 08/19/2018 0854   Lab Results  Component Value Date   WBC 6.7 08/19/2018   NEUTROABS 4.6 08/19/2018   HGB 17.5 (H) 08/19/2018   HCT 48.6 08/19/2018   MCV 90.0 08/19/2018   PLT 166 08/19/2018    Imaging:  [place] Clinician Interpretation: I have personally reviewed the CNS images as listed.  My interpretation, in the context of the patient's clinical presentation, is stable disease  Mr Jeri Cos Wo Contrast  Result Date: 12/07/2018 CLINICAL DATA:  Follow-up CNS neoplasm. Anaplastic astrocytoma diagnosed in 2012 EXAM: MRI HEAD WITHOUT AND WITH CONTRAST TECHNIQUE: Multiplanar, multiecho pulse sequences of the brain and surrounding structures were obtained without and with intravenous contrast. CONTRAST:  18mL MULTIHANCE GADOBENATE DIMEGLUMINE 529 MG/ML IV  SOLN COMPARISON:  08/16/2018 FINDINGS: Brain: Postoperative left occipital lobe with unchanged nonenhancing T2 hyperintensity around the resection cavity. There is volume loss rather than swelling. Minimal midline extra-axial fluid medial to the resection cavity. Elsewhere normal appearance of the brain. No acute or interval infarct, hemorrhage, hydrocephalus, or mass effect. Vascular: Major flow voids and vascular enhancements are preserved. Skull and upper cervical spine: Stable occipital craniotomy changes. Sinuses/Orbits: Negative IMPRESSION: Stable appearance of the left occipital resection site. No abnormal enhancement. Electronically Signed   By: Monte Fantasia M.D.   On: 12/07/2018 11:29    Assessment/Plan 1. Anaplastic astrocytoma [facility])  Mr. Kallal is clinically and radiographically stable today.  We recommended continuing observation, having now completed 6 cycles of 5-day TMZ.  He should return to clinic again in 3-4 months with an MRI brain for evaluation.  We appreciate the opportunity to participate in the care of Zavon Hyson.   All questions were answered. The patient knows to call the clinic with any problems, questions or concerns. No barriers to learning were detected.  The total time spent in the encounter was 25 minutes and more than 50% was on counseling and review of test results   Ventura Sellers, MD Medical Director of Neuro-Oncology [facility] at [place] 12/09/18 3:27 PM

## 2018-12-09 NOTE — Telephone Encounter (Signed)
Scheduled appt per 8/17 los,  Spoke with patient and he is aware of his appt date and time.

## 2019-04-02 ENCOUNTER — Other Ambulatory Visit: Payer: Self-pay | Admitting: Radiation Therapy

## 2019-04-04 ENCOUNTER — Ambulatory Visit
Admission: RE | Admit: 2019-04-04 | Discharge: 2019-04-04 | Disposition: A | Discharge status: Home / Self Care | Payer: BC Managed Care – PPO | Source: Ambulatory Visit | Attending: Internal Medicine | Admitting: Internal Medicine

## 2019-04-04 DIAGNOSIS — C719 Malignant neoplasm of brain, unspecified: Secondary | ICD-10-CM

## 2019-04-04 MED ORDER — GADOBENATE DIMEGLUMINE 529 MG/ML IV SOLN
17.0000 mL | Freq: Once | INTRAVENOUS | Status: AC | PRN
  Administered 2019-04-04: 17 mL via INTRAVENOUS

## 2019-04-07 ENCOUNTER — Inpatient Hospital Stay: Payer: BC Managed Care – PPO

## 2019-04-07 ENCOUNTER — Telehealth: Payer: Self-pay | Admitting: Internal Medicine

## 2019-04-07 ENCOUNTER — Inpatient Hospital Stay: Payer: BC Managed Care – PPO | Attending: Internal Medicine | Admitting: Internal Medicine

## 2019-04-07 DIAGNOSIS — C719 Malignant neoplasm of brain, unspecified: Secondary | ICD-10-CM

## 2019-04-07 NOTE — Telephone Encounter (Signed)
Scheduled appt per 12/14 los.  Spoke with pt and he is aware of his appt date and time. 

## 2019-04-08 NOTE — Progress Notes (Signed)
I connected with Robert Watkins on 04/08/19 at  9:00 AM EST by telephone visit and verified that I am speaking with the correct person using two identifiers.  I discussed the limitations, risks, security and privacy concerns of performing an evaluation and management service by telemedicine and the availability of in-person appointments. I also discussed with the patient that there may be a patient responsible charge related to this service. The patient expressed understanding and agreed to proceed.  Other persons participating in the visit and their role in the encounter:  n/a  [name] location:  Home  Provider's location:  Office  Chief Complaint:  Anaplastic Astrocytoma  History of Present Ilness: Robert Watkins describes no recent changes.  No new or progressive neurologic deficits.  No headaches or seizures. Observations: Language and cognition at baseline  Imaging:  [place] Clinician Interpretation: I have personally reviewed the CNS images as listed.  My interpretation, in the context of the patient's clinical presentation, is stable disease  MR Brain W Wo Contrast  Result Date: 04/04/2019 CLINICAL DATA:  Anaplastic astrocytoma. Additional history provided: Neoplasm: Head, CNS, Rx monitor or follow-up. Additional history provided: Surgery in 2012. EXAM: MRI HEAD WITHOUT AND WITH CONTRAST TECHNIQUE: Multiplanar, multiecho pulse sequences of the brain and surrounding structures were obtained without and with intravenous contrast. CONTRAST:  43mL MULTIHANCE GADOBENATE DIMEGLUMINE 529 MG/ML IV SOLN COMPARISON:  Prior brain MRI examination 12/06/2018 and earlier FINDINGS: Brain: Unchanged appearance of a left parietooccipital lobe resection cavity as compared to brain MRI 12/06/2018. Stable, mild T2/FLAIR hyperintensity along the margins of the resection cavity. No masslike or nodular enhancement is demonstrated. Persistent thin extra-axial fluid collection medial to the resection cavity and left parietal  lobe. No new parenchymal signal abnormality is identified. No evidence of acute infarct. No midline shift. Minimal chronic blood products at the resection site. Cerebral volume is normal for age. Right frontal developmental venous anomaly. Vascular: Flow voids maintained within the proximal large arterial vessels. Expected enhancement within the proximal large arterial vessels and dural venous sinuses. Skull and upper cervical spine: Parietooccipital craniotomy. Sinuses/Orbits: Visualized orbits demonstrate no acute abnormality. Minimal ethmoid sinus mucosal thickening. No significant mastoid effusion. IMPRESSION: Stable appearance of a left parietooccipital lobe resection cavity. No progressive signal abnormality or abnormal enhancement. Electronically Signed   By: Kellie Simmering DO   On: 04/04/2019 15:34    Assessment and Plan: Clinically and radiographically stable Follow Up Instructions: Follow up after repeat MRI brain in 4 months  I discussed the assessment and treatment plan with the patient.  The patient was provided an opportunity to ask questions and all were answered.  The patient agreed with the plan and demonstrated understanding of the instructions.    The patient was advised to call back or seek an in-person evaluation if the symptoms worsen or if the condition fails to improve as anticipated.  I provided 5-10 minutes of non-face-to-face time during this enocunter.  Ventura Sellers, MD   I provided 10 minutes of non face-to-face telephone visit time during this encounter, and > 50% was spent counseling as documented under my assessment & plan.

## 2019-07-01 ENCOUNTER — Other Ambulatory Visit: Payer: Self-pay | Admitting: Radiation Therapy

## 2019-08-08 ENCOUNTER — Other Ambulatory Visit: Payer: Self-pay

## 2019-08-08 ENCOUNTER — Ambulatory Visit
Admission: RE | Admit: 2019-08-08 | Discharge: 2019-08-08 | Disposition: A | Discharge status: Home / Self Care | Payer: BC Managed Care – PPO | Source: Ambulatory Visit | Attending: Internal Medicine | Admitting: Internal Medicine

## 2019-08-08 DIAGNOSIS — C719 Malignant neoplasm of brain, unspecified: Secondary | ICD-10-CM

## 2019-08-08 MED ORDER — GADOBENATE DIMEGLUMINE 529 MG/ML IV SOLN
20.0000 mL | Freq: Once | INTRAVENOUS | Status: AC | PRN
  Administered 2019-08-08: 20 mL via INTRAVENOUS

## 2019-08-11 ENCOUNTER — Other Ambulatory Visit: Payer: Self-pay

## 2019-08-11 ENCOUNTER — Inpatient Hospital Stay: Payer: BC Managed Care – PPO | Attending: Internal Medicine | Admitting: Internal Medicine

## 2019-08-11 ENCOUNTER — Inpatient Hospital Stay: Payer: BC Managed Care – PPO

## 2019-08-11 VITALS — BP 129/80 | HR 50 | Temp 98.5°F | Resp 18 | Ht 71.0 in | Wt 196.8 lb

## 2019-08-11 DIAGNOSIS — C719 Malignant neoplasm of brain, unspecified: Secondary | ICD-10-CM

## 2019-08-11 DIAGNOSIS — G40909 Epilepsy, unspecified, not intractable, without status epilepticus: Secondary | ICD-10-CM | POA: Diagnosis not present

## 2019-08-11 DIAGNOSIS — Z79899 Other long term (current) drug therapy: Secondary | ICD-10-CM | POA: Insufficient documentation

## 2019-08-11 DIAGNOSIS — Z87891 Personal history of nicotine dependence: Secondary | ICD-10-CM | POA: Diagnosis not present

## 2019-08-11 DIAGNOSIS — C714 Malignant neoplasm of occipital lobe: Secondary | ICD-10-CM | POA: Diagnosis present

## 2019-08-11 DIAGNOSIS — Z923 Personal history of irradiation: Secondary | ICD-10-CM | POA: Insufficient documentation

## 2019-08-11 DIAGNOSIS — Z9221 Personal history of antineoplastic chemotherapy: Secondary | ICD-10-CM | POA: Insufficient documentation

## 2019-08-11 NOTE — Progress Notes (Signed)
[facility] at [place], [place] [address] [phone]   Interval Evaluation  Date of Service: 08/11/19 Patient Name: Robert Watkins Patient MRN: [id number] Patient DOB: December 25, 1985 Provider: Ventura Sellers, MD  Identifying Statement:  Robert Watkins is a 34 y.o. 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 Kristeen Miss in [place]. 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 [name]. Pathology confirms anaplastic astrocytoma.   06/01/2010 Clinical Event-Other  New patient evaluation at [facility]. Recommend radiation therapy with concurrent Temozolomide followed by twelve cycles of five-day Temozolomide.   06/13/2010 - 08/01/2010 Radiation  Radiation with Temodar.  06/13/2010 - 08/01/2010 Chemotherapy  Temodar 75 mg/m2 with radiation.  08/16/2010 - 08/07/2011 Chemotherapy  5 day Temodar 200 mg/m2  02/19/2018 Progression  Progression of non-enhancing tumor compared to 2013. Recommend initiating metronomic daily Temodar 50 mg/m2/day.  Interval History:  Robert Watkins presents today for follow up after recent MRI brain.  He denies any focal neurologic symptoms or recent seizures.  He remains active and functionally independent.  Currently living at home looking for work.  Medications: Current Outpatient Medications on File Prior to Visit  Medication Sig Dispense Refill  . amLODipine (NORVASC) 2.5 MG tablet Take 1 tablet (2.5 mg total) by mouth daily. (Patient not taking: Reported on 06/18/2018) 30 tablet 0  . levETIRAcetam (KEPPRA) 500 MG tablet Take 1 tablet (500 mg total) by mouth 2 (two) times daily. 60 tablet 0  .  ondansetron (ZOFRAN) 8 MG tablet Take 1 tablet (8 mg total) by mouth 2 (two) times daily as needed. Start on the third day after chemotherapy. (Patient not taking: Reported on 06/18/2018) 30 tablet 1   No current facility-administered medications on file prior to visit.    Allergies:  Allergies  Allergen Reactions  . Lamotrigine Rash   Past Medical History:  Past Medical History:  Diagnosis Date  . Cancer of brain ([place]) 04/07/2011  . 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 History of chemotherapy 06/13/2010-08/01/2010   42 day Temodar with concurrent Radiation  . History of chemotherapy 08/16/2010-08/07/2011   5 day Temodar 200 mg/m2  . History of radiation therapy 06/13/2010-08/01/2010   [facility] [place] [facility]. Recommend radiation therapy with concurrent Temozolomide followed by twelve cycles of five-day Temozolomide. ?  . Seizure disorder, secondary ([place]) 04/07/2011   Past Surgical History:  Past Surgical History:  Procedure Laterality Date  . CRANIECTOMY / CRANIOTOMY FOR EXCISION OF BRAIN TUMOR Left 05/26/2010   Left parietooccipital craniotomy with tumor resection by Dr. [name]. Pathology confirms anaplastic astrocytoma. ?  . CRANIOTOMY FOR STEREOTACTIC GUIDED SURGERY Left 05/12/2010   Stereotactic biopsy of left occipital lesion by Dr. Kristeen Miss in [place]. Pathology demonstrates anaplastic astrocytoma (WHO III)?   Social History:  Social History   Socioeconomic History  . Marital status: Single    Spouse name: Not on file  . Number of children: Not on file  . Years of education: Not on file  . Highest education level: Not on file  Occupational History  . Not on file  Tobacco Use  . Smoking status: Former Research scientist (life sciences)  . Smokeless tobacco: Never Used  Substance and Sexual Activity  . Alcohol use:  Yes    Alcohol/week: 4.0 standard drinks    Types: 4 Shots of liquor per week  . Drug use: Yes    Types: Marijuana  . Sexual activity:  Yes  Other Topics Concern  . Not on file  Social History Narrative  . Not on file   Social Determinants of Health   Financial Resource Strain:   . Difficulty of Paying Living Expenses:   Food Insecurity:   . Worried About Charity fundraiser in the Last Year:   . Arboriculturist in the Last Year:   Transportation Needs:   . Film/video editor (Medical):   Marland Kitchen Lack of Transportation (Non-Medical):   Physical Activity:   . Days of Exercise per Week:   . Minutes of Exercise per Session:   Stress:   . Feeling of Stress :   Social Connections:   . Frequency of Communication with Friends and Family:   . Frequency of Social Gatherings with Friends and Family:   . Attends Religious Services:   . Active Member of Clubs or Organizations:   . Attends Archivist Meetings:   Marland Kitchen Marital Status:   Intimate Partner Violence:   . Fear of Current or Ex-Partner:   . Emotionally Abused:   Marland Kitchen Physically Abused:   . Sexually Abused:    Family History: No family history on file.  Review of Systems: Constitutional: Denies fevers, chills or abnormal weight loss Eyes: Denies blurriness of vision Ears, nose, mouth, throat, and face: Denies mucositis or sore throat Respiratory: Denies cough, dyspnea or wheezes Cardiovascular: Denies palpitation, chest discomfort or lower extremity swelling Gastrointestinal:  Denies nausea, constipation, diarrhea GU: Denies dysuria or incontinence Skin: Denies abnormal skin rashes Neurological: Per HPI Musculoskeletal: Denies joint pain, back or neck discomfort. No decrease in ROM Behavioral/Psych: Denies anxiety, disturbance in thought content, and mood instability  Physical Exam: Vitals:   08/11/19 0920  BP: 129/80  Pulse: (!) 50  Resp: 18  Temp: 98.5 F (36.9 C)  SpO2: 98%   KPS: 100. General: Alert, cooperative, pleasant, in no acute distress Head: Normal EENT: No conjunctival injection or scleral icterus. Oral mucosa moist Lungs: Resp  effort normal Cardiac: Regular rate and rhythm Abdomen: Soft, non-distended abdomen Skin: No rashes cyanosis or petechiae. Extremities: No clubbing or edema  Neurologic Exam: Mental Status: Awake, alert, attentive to examiner. Oriented to self and environment. Language is fluent with intact comprehension.  Cranial Nerves: Visual acuity is grossly normal. Visual fields are full. Extra-ocular movements intact. No ptosis. Face is symmetric, tongue midline. Motor: Tone and bulk are normal. Power is full in both arms and legs. Reflexes are symmetric, no pathologic reflexes present. Intact finger to nose bilaterally Sensory: Intact to light touch and temperature Gait: Normal and tandem gait is normal.   Labs: I have reviewed the data as listed    Component Value Date/Time   NA 141 08/19/2018 0854   K 4.6 08/19/2018 0854   CL 107 08/19/2018 0854   CO2 25 08/19/2018 0854   GLUCOSE 94 08/19/2018 0854   BUN 16 08/19/2018 0854   CREATININE 1.16 08/19/2018 0854   CALCIUM 9.3 08/19/2018 0854   PROT 7.0 08/19/2018 0854   ALBUMIN 4.2 08/19/2018 0854   AST 22 08/19/2018 0854   ALT 42 08/19/2018 0854   ALKPHOS 89 08/19/2018 0854   BILITOT 0.5 08/19/2018 0854   GFRNONAA >60 08/19/2018 0854   GFRAA >60 08/19/2018 0854   Lab Results  Component Value  Date   WBC 6.7 08/19/2018   NEUTROABS 4.6 08/19/2018   HGB 17.5 (H) 08/19/2018   HCT 48.6 08/19/2018   MCV 90.0 08/19/2018   PLT 166 08/19/2018    Imaging:  [facility] [place] Clinician Interpretation: I have personally reviewed the CNS images as listed.  My interpretation, in the context of the patient's clinical presentation, is stable disease  MR Brain W Wo Contrast  Result Date: 08/09/2019 CLINICAL DATA:  History of anaplastic astrocytoma status post craniotomy and radiation. EXAM: MRI HEAD WITHOUT AND WITH CONTRAST TECHNIQUE: Multiplanar, multiecho pulse sequences of the brain and surrounding structures were obtained without and with intravenous  contrast. CONTRAST:  35mL MULTIHANCE GADOBENATE DIMEGLUMINE 529 MG/ML IV SOLN COMPARISON:  Brain MRI 04/04/2019 FINDINGS: Brain: No acute infarct, acute hemorrhage or extra-axial collection. Postsurgical changes of the medial left occipital lobe are redemonstrated. Associated gliosis is unchanged. Parenchymal signal is otherwise normal. Normal volume of CSF spaces. No chronic microhemorrhage. Normal midline structures. There is no abnormal contrast enhancement. Vascular: Normal flow voids. Skull and upper cervical spine: Remote posterior midline craniotomy Sinuses/Orbits: Normal Other: None IMPRESSION: Stable postsurgical changes of the left occipital lobe. No evidence of recurrent or residual tumor. Electronically Signed   By: Ulyses Jarred M.D.   On: 08/09/2019 02:41    Assessment/Plan 1. Anaplastic astrocytoma [facility])  Robert Watkins is clinically and radiographically stable today.  We recommended continuing observation, having now completed 6 cycles of 5-day TMZ.  He should return to clinic again in 6 months with an MRI brain for evaluation or sooner if needed.  We appreciate the opportunity to participate in the care of Robert Watkins.   All questions were answered. The patient knows to call the clinic with any problems, questions or concerns. No barriers to learning were detected.  The total time spent in the encounter was 30 minutes and more than 50% was on counseling and review of test results   Ventura Sellers, MD Medical Director of Neuro-Oncology [facility] at [place] 08/11/19 9:13 AM

## 2019-08-13 ENCOUNTER — Telehealth: Payer: Self-pay | Admitting: Internal Medicine

## 2019-08-13 NOTE — Telephone Encounter (Signed)
Scheduled per los. Called and left msg. Mailed printout  °

## 2020-01-13 ENCOUNTER — Other Ambulatory Visit: Payer: Self-pay | Admitting: Radiation Therapy

## 2020-02-01 ENCOUNTER — Ambulatory Visit
Admission: RE | Admit: 2020-02-01 | Discharge: 2020-02-01 | Disposition: A | Discharge status: Home / Self Care | Payer: BC Managed Care – PPO | Source: Ambulatory Visit | Attending: Internal Medicine | Admitting: Internal Medicine

## 2020-02-01 DIAGNOSIS — C719 Malignant neoplasm of brain, unspecified: Secondary | ICD-10-CM

## 2020-02-01 MED ORDER — GADOBENATE DIMEGLUMINE 529 MG/ML IV SOLN
18.0000 mL | Freq: Once | INTRAVENOUS | Status: AC | PRN
  Administered 2020-02-01: 18 mL via INTRAVENOUS

## 2020-02-02 ENCOUNTER — Inpatient Hospital Stay: Payer: BC Managed Care – PPO | Attending: Internal Medicine

## 2020-02-02 DIAGNOSIS — Z9221 Personal history of antineoplastic chemotherapy: Secondary | ICD-10-CM | POA: Insufficient documentation

## 2020-02-02 DIAGNOSIS — Z87891 Personal history of nicotine dependence: Secondary | ICD-10-CM | POA: Insufficient documentation

## 2020-02-02 DIAGNOSIS — Z923 Personal history of irradiation: Secondary | ICD-10-CM | POA: Insufficient documentation

## 2020-02-02 DIAGNOSIS — Z79899 Other long term (current) drug therapy: Secondary | ICD-10-CM | POA: Insufficient documentation

## 2020-02-02 DIAGNOSIS — C714 Malignant neoplasm of occipital lobe: Secondary | ICD-10-CM | POA: Insufficient documentation

## 2020-02-10 ENCOUNTER — Inpatient Hospital Stay: Payer: BC Managed Care – PPO | Admitting: Internal Medicine

## 2020-02-10 ENCOUNTER — Other Ambulatory Visit: Payer: Self-pay

## 2020-02-10 ENCOUNTER — Encounter: Payer: Self-pay | Admitting: Internal Medicine

## 2020-02-10 VITALS — BP 131/89 | HR 79 | Temp 97.8°F | Resp 18 | Ht 71.0 in | Wt 199.3 lb

## 2020-02-10 DIAGNOSIS — Z79899 Other long term (current) drug therapy: Secondary | ICD-10-CM | POA: Diagnosis not present

## 2020-02-10 DIAGNOSIS — G40909 Epilepsy, unspecified, not intractable, without status epilepticus: Secondary | ICD-10-CM

## 2020-02-10 DIAGNOSIS — Z87891 Personal history of nicotine dependence: Secondary | ICD-10-CM | POA: Diagnosis not present

## 2020-02-10 DIAGNOSIS — C719 Malignant neoplasm of brain, unspecified: Secondary | ICD-10-CM

## 2020-02-10 DIAGNOSIS — Z923 Personal history of irradiation: Secondary | ICD-10-CM | POA: Diagnosis not present

## 2020-02-10 DIAGNOSIS — C714 Malignant neoplasm of occipital lobe: Secondary | ICD-10-CM | POA: Diagnosis present

## 2020-02-10 DIAGNOSIS — Z9221 Personal history of antineoplastic chemotherapy: Secondary | ICD-10-CM | POA: Diagnosis not present

## 2020-02-10 NOTE — Progress Notes (Signed)
[facility] at [place], [place] [address] [phone]   Interval Evaluation  Date of Service: 02/10/20 Patient Name: Robert Watkins Patient MRN: [id number] Patient DOB: 07-20-1985 Provider: Ventura Sellers, MD  Identifying Statement:  Robert Watkins is a 34 y.o. 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 Kristeen Miss in [place]. 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 [name]. Pathology confirms anaplastic astrocytoma.   06/01/2010 Clinical Event-Other  New patient evaluation at [facility]. Recommend radiation therapy with concurrent Temozolomide followed by twelve cycles of five-day Temozolomide.   06/13/2010 - 08/01/2010 Radiation  Radiation with Temodar.  06/13/2010 - 08/01/2010 Chemotherapy  Temodar 75 mg/m2 with radiation.  08/16/2010 - 08/07/2011 Chemotherapy  5 day Temodar 200 mg/m2  02/19/2018 Progression  Progression of non-enhancing tumor compared to 2013. Recommend initiating metronomic daily Temodar 50 mg/m2/day.  Interval History:  Robert Watkins presents today for follow up after recent MRI brain.  No new or progressive neurologic deficits.  No headaches or seizures.  He remains active and functionally independent.  Currently working part time.  Medications: Current Outpatient Medications on File Prior to Visit  Medication Sig Dispense Refill  . levETIRAcetam (KEPPRA) 500 MG tablet Take 1 tablet (500 mg total) by mouth 2 (two) times daily. 60 tablet 0  . amLODipine (NORVASC) 2.5 MG tablet Take 1 tablet (2.5 mg total) by mouth daily. (Patient not taking: Reported on 06/18/2018) 30 tablet 0  .  ondansetron (ZOFRAN) 8 MG tablet Take 1 tablet (8 mg total) by mouth 2 (two) times daily as needed. Start on the third day after chemotherapy. (Patient not taking: Reported on 06/18/2018) 30 tablet 1   No current facility-administered medications on file prior to visit.    Allergies:  Allergies  Allergen Reactions  . Lamotrigine Rash   Past Medical History:  Past Medical History:  Diagnosis Date  . Cancer of brain ([place]) 04/07/2011  . 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 History of chemotherapy 06/13/2010-08/01/2010   42 day Temodar with concurrent Radiation  . History of chemotherapy 08/16/2010-08/07/2011   5 day Temodar 200 mg/m2  . History of radiation therapy 06/13/2010-08/01/2010   [facility] [place] [facility]. Recommend radiation therapy with concurrent Temozolomide followed by twelve cycles of five-day Temozolomide. ?  . Seizure disorder, secondary ([place]) 04/07/2011   Past Surgical History:  Past Surgical History:  Procedure Laterality Date  . CRANIECTOMY / CRANIOTOMY FOR EXCISION OF BRAIN TUMOR Left 05/26/2010   Left parietooccipital craniotomy with tumor resection by Dr. [name]. Pathology confirms anaplastic astrocytoma. ?  . CRANIOTOMY FOR STEREOTACTIC GUIDED SURGERY Left 05/12/2010   Stereotactic biopsy of left occipital lesion by Dr. Kristeen Miss in [place]. Pathology demonstrates anaplastic astrocytoma (WHO III)?   Social History:  Social History   Socioeconomic History  . Marital status: Single    Spouse name: Not on file  . Number of children: Not on file  . Years of education: Not on file  . Highest education level: Not on file  Occupational History  . Not on file  Tobacco Use  . Smoking status: Former Research scientist (life sciences)  . Smokeless tobacco: Never Used  Substance and Sexual Activity  . Alcohol use: Yes  Alcohol/week: 4.0 standard drinks    Types: 4 Shots of liquor per week  . Drug use: Yes    Types: Marijuana  . Sexual activity:  Yes  Other Topics Concern  . Not on file  Social History Narrative  . Not on file   Social Determinants of Health   Financial Resource Strain:   . Difficulty of Paying Living Expenses: Not on file  Food Insecurity:   . Worried About Charity fundraiser in the Last Year: Not on file  . Ran Out of Food in the Last Year: Not on file  Transportation Needs:   . Lack of Transportation (Medical): Not on file  . Lack of Transportation (Non-Medical): Not on file  Physical Activity:   . Days of Exercise per Week: Not on file  . Minutes of Exercise per Session: Not on file  Stress:   . Feeling of Stress : Not on file  Social Connections:   . Frequency of Communication with Friends and Family: Not on file  . Frequency of Social Gatherings with Friends and Family: Not on file  . Attends Religious Services: Not on file  . Active Member of Clubs or Organizations: Not on file  . Attends Archivist Meetings: Not on file  . Marital Status: Not on file  Intimate Partner Violence:   . Fear of Current or Ex-Partner: Not on file  . Emotionally Abused: Not on file  . Physically Abused: Not on file  . Sexually Abused: Not on file   Family History: History reviewed. No pertinent family history.  Review of Systems: Constitutional: Denies fevers, chills or abnormal weight loss Eyes: Denies blurriness of vision Ears, nose, mouth, throat, and face: Denies mucositis or sore throat Respiratory: Denies cough, dyspnea or wheezes Cardiovascular: Denies palpitation, chest discomfort or lower extremity swelling Gastrointestinal:  Denies nausea, constipation, diarrhea GU: Denies dysuria or incontinence Skin: Denies abnormal skin rashes Neurological: Per HPI Musculoskeletal: Denies joint pain, back or neck discomfort. No decrease in ROM Behavioral/Psych: Denies anxiety, disturbance in thought content, and mood instability  Physical Exam: Vitals:   02/10/20 0953  BP: 131/89  Pulse: 79  Resp:  18  Temp: 97.8 F (36.6 C)  SpO2: 100%   KPS: 100. General: Alert, cooperative, pleasant, in no acute distress Head: Normal EENT: No conjunctival injection or scleral icterus. Oral mucosa moist Lungs: Resp effort normal Cardiac: Regular rate and rhythm Abdomen: Soft, non-distended abdomen Skin: No rashes cyanosis or petechiae. Extremities: No clubbing or edema  Neurologic Exam: Mental Status: Awake, alert, attentive to examiner. Oriented to self and environment. Language is fluent with intact comprehension.  Cranial Nerves: Visual acuity is grossly normal. Visual fields are full. Extra-ocular movements intact. No ptosis. Face is symmetric, tongue midline. Motor: Tone and bulk are normal. Power is full in both arms and legs. Reflexes are symmetric, no pathologic reflexes present. Intact finger to nose bilaterally Sensory: Intact to light touch and temperature Gait: Normal and tandem gait is normal.   Labs: I have reviewed the data as listed    Component Value Date/Time   NA 141 08/19/2018 0854   K 4.6 08/19/2018 0854   CL 107 08/19/2018 0854   CO2 25 08/19/2018 0854   GLUCOSE 94 08/19/2018 0854   BUN 16 08/19/2018 0854   CREATININE 1.16 08/19/2018 0854   CALCIUM 9.3 08/19/2018 0854   PROT 7.0 08/19/2018 0854   ALBUMIN 4.2 08/19/2018 0854   AST 22 08/19/2018 0854   ALT 42 08/19/2018  0854   ALKPHOS 89 08/19/2018 0854   BILITOT 0.5 08/19/2018 0854   GFRNONAA >60 08/19/2018 0854   GFRAA >60 08/19/2018 0854   Lab Results  Component Value Date   WBC 6.7 08/19/2018   NEUTROABS 4.6 08/19/2018   HGB 17.5 (H) 08/19/2018   HCT 48.6 08/19/2018   MCV 90.0 08/19/2018   PLT 166 08/19/2018    Imaging:  [place] Clinician Interpretation: I have personally reviewed the CNS images as listed.  My interpretation, in the context of the patient's clinical presentation, is progressive disease  MR BRAIN W WO CONTRAST  Result Date: 02/02/2020 CLINICAL DATA:  Follow-up anaplastic  astrocytoma. EXAM: MRI HEAD WITHOUT AND WITH CONTRAST TECHNIQUE: Multiplanar, multiecho pulse sequences of the brain and surrounding structures were obtained without and with intravenous contrast. CONTRAST:  89m MULTIHANCE GADOBENATE DIMEGLUMINE 529 MG/ML IV SOLN COMPARISON:  08/08/2019 and earlier FINDINGS: Brain: Sequelae of left occipital tumor resection are again identified. T2 hyperintensity surrounding the resection cavity in the left occipital lobe demonstrates a slow progression since 08/16/2018 with mild mass effect on the occipital horn of the left lateral ventricle. There is no significant enhancement. Elsewhere, the brain is normal in signal. No acute infarct or midline shift is evident. A trace extra-axial fluid collection along the falx in the left occipital region is unchanged. A developmental venous anomaly is noted in the right frontal lobe. Vascular: Major intracranial vascular flow voids are preserved. Skull and upper cervical spine: Left parieto-occipital craniotomy. Sinuses/Orbits: Unremarkable orbits. Paranasal sinuses and mastoid air cells are clear. Other: None. IMPRESSION: Slow progression of T2 signal abnormality in the left occipital lobe suspicious for nonenhancing tumor. Electronically Signed   By: ALogan BoresM.D.   On: 02/02/2020 09:34    Assessment/Plan 1. Anaplastic astrocytoma ([facility]  Mr. PUllomis clinically stable today.  MRI demonstrates focus of non-enhancing progression within anterior left occipital lobe concerning for infiltrative glioma.  Alternately but less likely, could represent inflammation or other non-neoplastic gliosis.    Sc[facility] and [facility]    We agree with her recommendation for trial of IDH-1 inhibitor Ivosidenib 5047mdaily.  [email or web address] He is at risk for hyper-mutated phenotype or other complication from cytotoxic chemotherapy given his prior  exposure.  Would be highly preferable to use targeted agent if available and obtainable for the patient.  We will begin prior authorization process to obtain drug.  We would recommend monthly follow ups and repeat MRI brain in 3 months.  We may need to perform routine labs prior to initiation of Ivosidenib.   We appreciate the opportunity to participate in the care of NoCalum Cormier We will be in touch with him shortly pending approval for therapy.  All questions were answered. The patient knows to call the clinic with any problems, questions or concerns. No barriers to learning were detected.  I have spent a total of 40 minutes of face-to-face and non-face-to-face time, excluding clinical staff time, preparing to see patient, ordering tests and/or medications, counseling the patient, and care coordination    ZaVentura SellersMD Medical Director of Neuro-Oncology [facility] [place]0/19/21 4:39 PM

## 2020-02-12 ENCOUNTER — Telehealth: Payer: Self-pay

## 2020-02-12 ENCOUNTER — Telehealth: Payer: Self-pay | Admitting: Pharmacist

## 2020-02-12 ENCOUNTER — Other Ambulatory Visit: Payer: Self-pay | Admitting: Internal Medicine

## 2020-02-12 MED ORDER — IVOSIDENIB 250 MG PO TABS: 500.0000 mg | ORAL_TABLET | Freq: Every day | ORAL | 0 refills | Status: DC

## 2020-02-12 NOTE — Telephone Encounter (Signed)
Oral Oncology Patient Advocate Encounter  Received notification from [facility] [employer] [place]  that prior authorization for Tibsovo is required.  PA submitted on CoverMyMeds Key [id number] Status is pending  Oral Oncology Clinic will continue to follow.  [place] Patient [place] Phone [phone] Fax [phone] 02/12/2020 10:19 AM

## 2020-02-12 NOTE — Telephone Encounter (Signed)
Oral Oncology Pharmacist Encounter  Received new prescription for Tibsovo (ivosidenib) for the treatment of progressive IDH-mutant grade III anaplastic astrocytoma, planned duration until disease progression or unacceptable drug toxicity.  Prescription dose and frequency assessed for appropriateness. Appropriate for therapy initiation pending baseline labs and baseline EKG.   Patient will need updated labs prior to initiation of ivosidenib - will review labs prior to therapy initiation. Additionally, patient will need baseline EKG prior to initiation of ivosidenib, then once weekly for the first 3 weeks, and then monthly thereafter given high risk of QTc prolongation with use of ivosidenib. Will discuss with Dr. Mickeal Skinner.  Current medication list in Epic reviewed, DDIs with Ivosidenib identified:  [name] currently medication list ondansetron PRN for N/V - given the risk of QTc prolongation with both Ivosidenib and Ondansetron, recommend avoiding ondansetron while on therapy. Will confirm with patient that he is no longer taking ondansetron.   Evaluated chart and no patient barriers to medication adherence noted.   Prescription has been e-scribed to the [facility] for benefits analysis and approval.  Oral Oncology Clinic will continue to follow for insurance authorization, copayment issues, initial counseling and start date.  Leron Croak, PharmD, BCPS Hematology/Oncology Clinical Pharmacist [facility] [phone] 02/12/2020 10:15 AM

## 2020-02-12 NOTE — Progress Notes (Signed)
i

## 2020-02-17 NOTE — Telephone Encounter (Signed)
Oral Oncology Pharmacist Encounter   Prior Authorization for Tibsovo (ivosidenib) has been denied.    Appeal letter sent to [facility] of [facility] Department with supporting documentation. Appeal faxed to: [phone]    Oral Oncology Clinic will continue to follow.   Leron Croak, PharmD, BCPS Hematology/Oncology Clinical Pharmacist [facility] [phone] 02/17/2020 8:58 AM

## 2020-02-19 NOTE — Telephone Encounter (Signed)
Oral Oncology Pharmacist Encounter  Appeal forTibsovo (ivosidenib)has been upheld per [facility] Department on 02/19/20.   Will proceed with manufacturer assistance for medication acquisition at this time.   Oral Oncology Clinic will continue to follow.  Leron Croak, PharmD, BCPS Hematology/Oncology Clinical Pharmacist [facility] [phone] 02/19/2020 1:16 PM

## 2020-02-20 ENCOUNTER — Telehealth: Payer: Self-pay

## 2020-02-20 NOTE — Telephone Encounter (Signed)
Oral Oncology Patient Advocate Encounter  Met patient in lobby to complete application for [employer] in an effort to reduce patient's out of pocket expense for Tibsovo to $0.    Application completed and faxed to [phone].   [employer] patient assistance phone number for follow up is [phone].   This encounter will be updated until final determination.   [place] Patient [place] Phone [phone] Fax [phone] 02/20/2020 3:23 PM

## 2020-02-23 ENCOUNTER — Other Ambulatory Visit: Payer: Self-pay | Admitting: Internal Medicine

## 2020-02-23 DIAGNOSIS — C719 Malignant neoplasm of brain, unspecified: Secondary | ICD-10-CM

## 2020-02-23 NOTE — Telephone Encounter (Signed)
Patient is approved for Tibsovo at no charge from [employer] 02/23/20-04/23/21.  [place] Patient [place] Phone [phone] Fax [phone] 02/23/2020 4:08 PM

## 2020-02-24 ENCOUNTER — Telehealth: Payer: Self-pay | Admitting: Internal Medicine

## 2020-02-24 IMAGING — MR MRI HEAD WITHOUT AND WITH CONTRAST
13 series · 48 of 48 positions shown · IV contrast (multihance)
Comparison: 02/19/2018 brain MRI from [facility]

CLINICAL DATA: Left occipital anaplastic astrocytoma resected in
0140 followed by radiation and chemotherapy. Progression of
nonenhancing tumor diagnosed in [DATE] with subsequent initiation
of Temodar therapy.

EXAM:
MRI HEAD WITHOUT AND WITH CONTRAST
TECHNIQUE: Multiplanar, multiecho pulse sequences of the brain and surrounding
structures were obtained without and with intravenous contrast.
CONTRAST:  18mL MULTIHANCE GADOBENATE DIMEGLUMINE 529 MG/ML IV SOLN

[Series 2: t1_se_sag · sagittal · 5.0mm · 0.45mm/px · 1 of 24 slices shown]
[im 1/24]
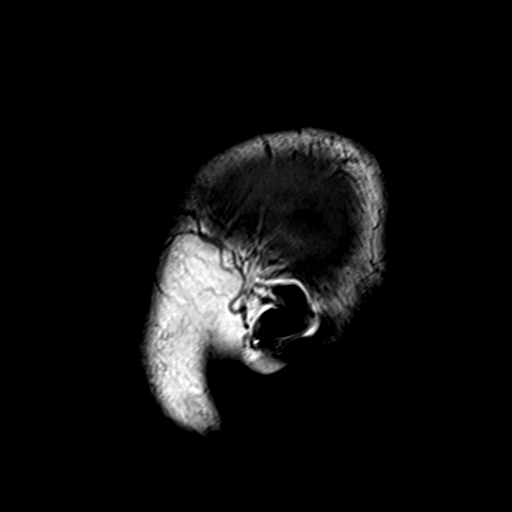

[Series 3: ep2d_diff_(id)_trace · axial · 3.0mm · 1.80mm/px · z∈[-43,+115]mm · 5 of 107 slices shown]
[im 1/107]
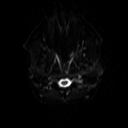
[im 27/107]
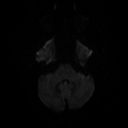
[im 54/107]
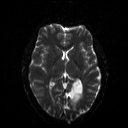
[im 80/107]
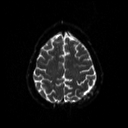
[im 107/107]
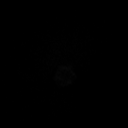

[Series 4: ep2d_diff_(id)_trace_adc · axial · 3.0mm · 1.80mm/px · z∈[-43,+115]mm · 3 of 50 slices shown]
[im 1/50]
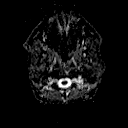
[im 25/50]
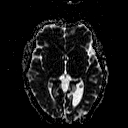
[im 50/50]
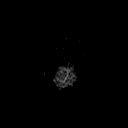

[Series 5: ep2d_diff_cor · coronal · 5.0mm · 1.77mm/px · 3 of 56 slices shown]
[im 1/56]
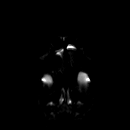
[im 28/56]
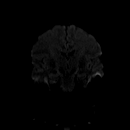
[im 56/56]
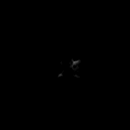

[Series 6: ep2d_diff_cor_adc · coronal · 5.0mm · 1.77mm/px · 2 of 28 slices shown]
[im 1/28]
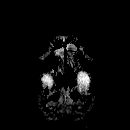
[im 28/28]
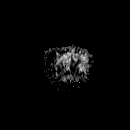

[Series 7: FLAIR · axial · 3.0mm · 0.45mm/px · z∈[-61,+107]mm · 2 of 29 slices shown]
[im 1/29]
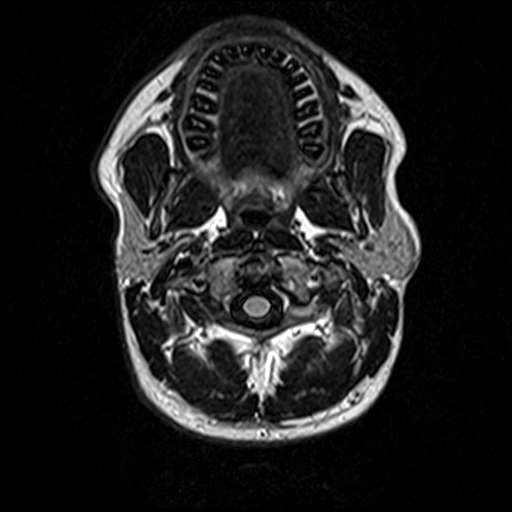
[im 29/29]
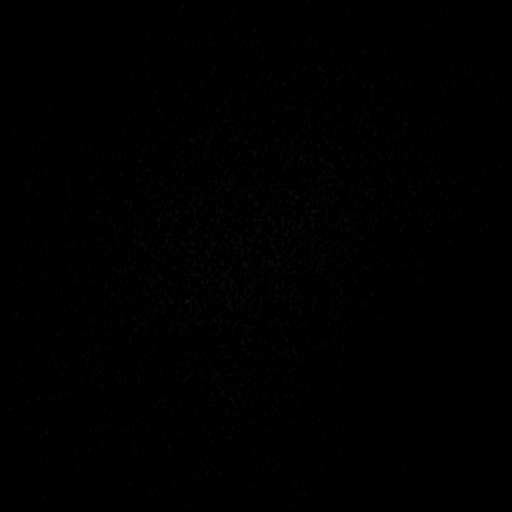

[Series 8: t2_tse_tra · axial · 5.0mm · 0.60mm/px · z∈[-52,+98]mm · 2 of 26 slices shown]
[im 1/26]
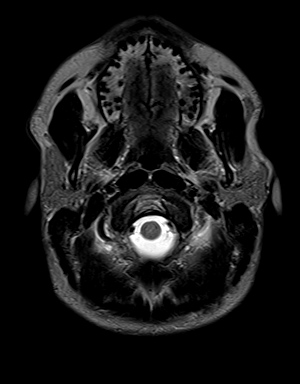
[im 26/26]
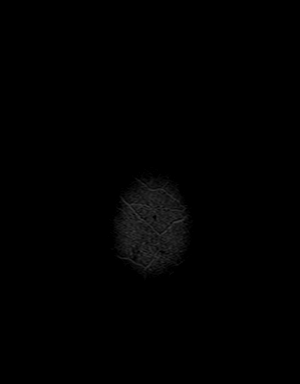

[Series 10: swi_images · axial · 2.0mm · 0.90mm/px · z∈[-56,+102]mm · 5 of 80 slices shown]
[im 1/80]
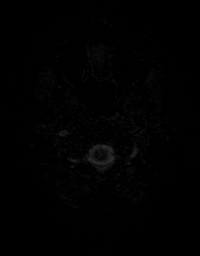
[im 20/80]
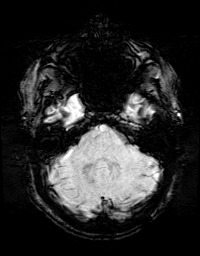
[im 40/80]
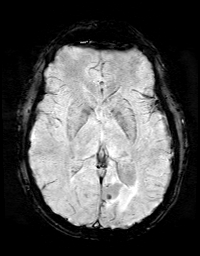
[im 60/80]
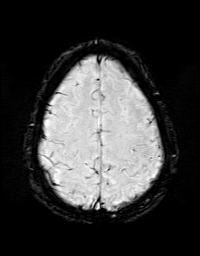
[im 80/80]
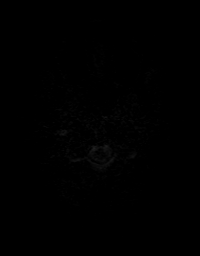

[Series 11: t1_mpr_tra · axial · 1.0mm · 0.72mm/px · z∈[-56,+103]mm · 10 of 160 slices shown]
[im 1/160]
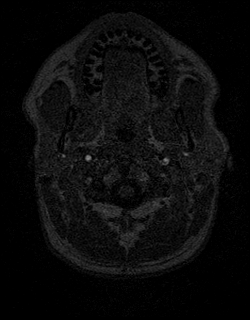
[im 18/160]
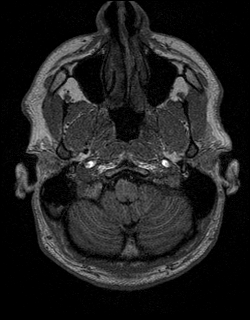
[im 36/160]
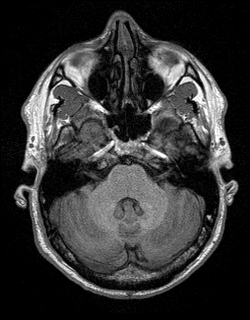
[im 54/160]
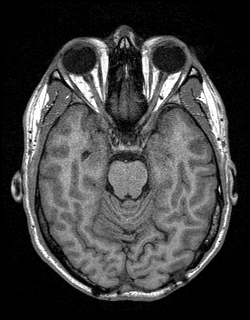
[im 71/160]
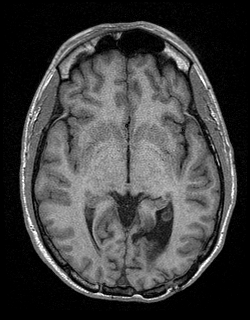
[im 89/160]
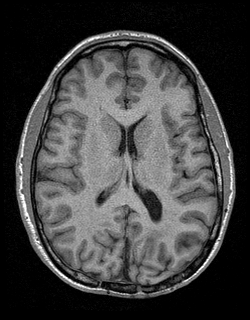
[im 107/160]
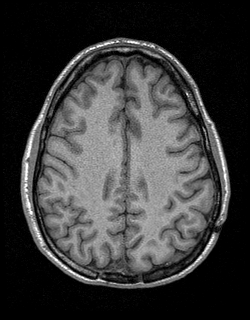
[im 124/160]
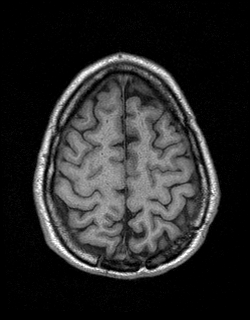
[im 142/160]
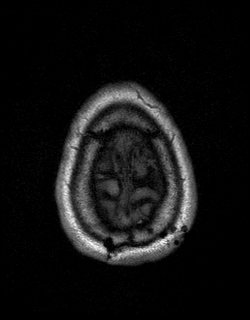
[im 160/160]
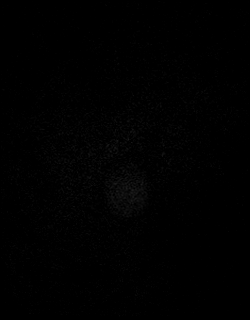

[Series 12: T2 post-contrast · coronal · 5.0mm · 0.45mm/px · 2 of 30 slices shown]
[im 1/30]
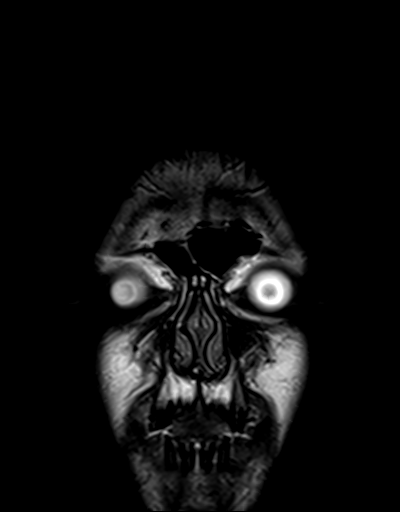
[im 30/30]
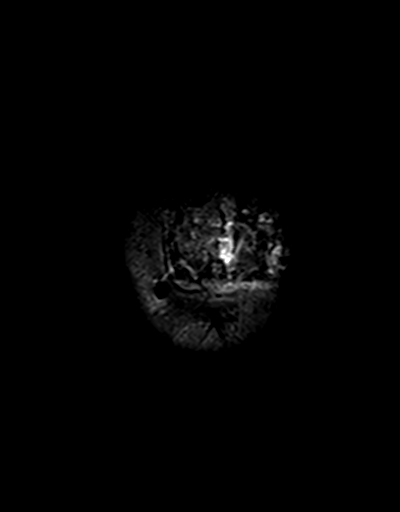

[Series 13: post t1_mpr_tra · axial · 1.0mm · 0.72mm/px · z∈[-56,+103]mm · 10 of 160 slices shown]
[im 1/160]
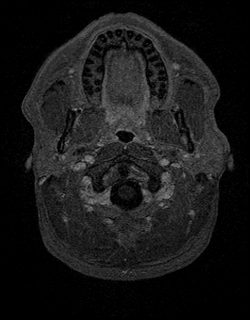
[im 18/160]
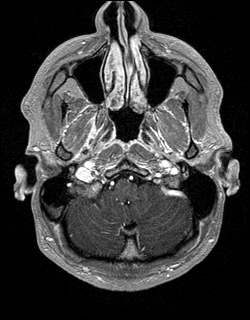
[im 36/160]
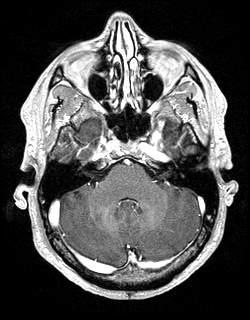
[im 54/160]
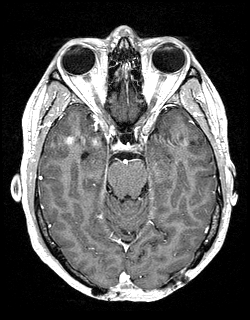
[im 71/160]
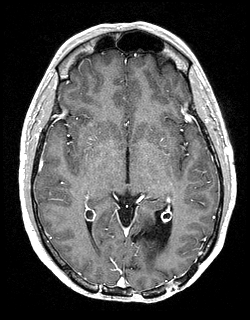
[im 89/160]
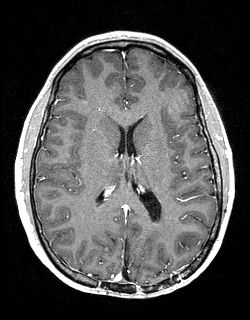
[im 107/160]
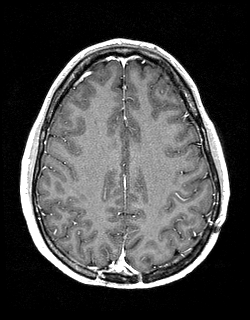
[im 124/160]
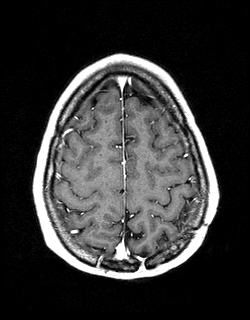
[im 142/160]
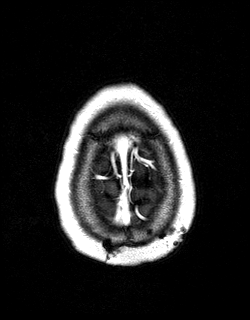
[im 160/160]
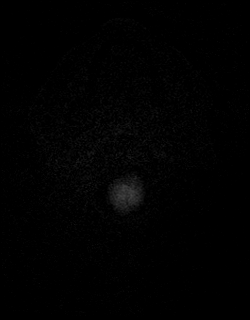

[Series 14: T1 post-contrast · coronal · 5.0mm · 0.72mm/px · 2 of 30 slices shown]
[im 1/30]
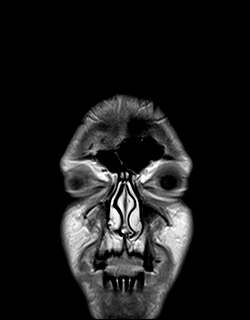
[im 30/30]
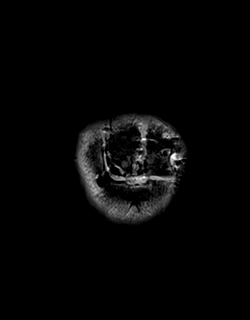

[Series 15: t1_se_sag post · sagittal · 5.0mm · 0.45mm/px · 1 of 24 slices shown]
[im 1/24]
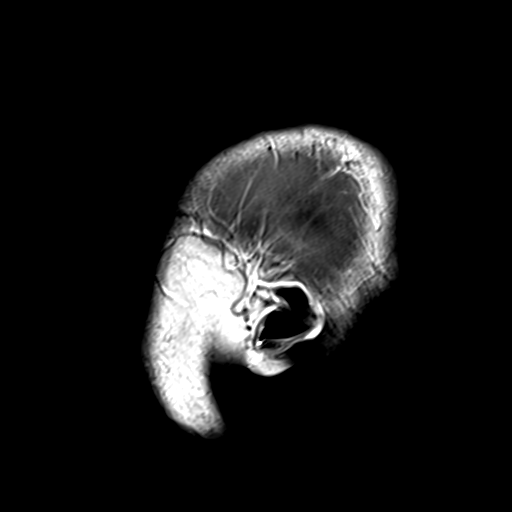

[48 of 48 positions shown; findings below may reference images not displayed]

FINDINGS: Brain: There is no evidence of acute infarct, midline shift, or
extra-axial fluid collection. Sequelae of left occipital tumor
resection are again identified with unchanged appearance of
underlying resection cavity and ex vacuo dilatation of the left
occipital horn. T2/FLAIR hyperintensity surrounding the resection
cavity has minimally increased posteromedially (series 7, image 14
today compared to series 9, image 21 of the prior study). There is
no associated abnormal enhancement. Small foci of chronic hemorrhage
are noted in the medial aspects of both occipital lobes. There is a
developmental venous anomaly in the right frontal lobe.

Vascular: Major intracranial vascular flow voids are preserved.

Skull and upper cervical spine: Left parieto-occipital craniotomy.

Sinuses/Orbits: Unremarkable orbits. Paranasal sinuses and mastoid
air cells are clear.

Other: None.
IMPRESSION: Minimally increased nonenhancing T2 signal abnormality posterior to
the left occipital resection cavity.

## 2020-02-24 NOTE — Telephone Encounter (Signed)
Scheduled appt per 11/2 sch msg - no answer. Left message for patient with appt date and time

## 2020-02-27 ENCOUNTER — Other Ambulatory Visit: Payer: BC Managed Care – PPO

## 2020-02-27 ENCOUNTER — Ambulatory Visit: Payer: BC Managed Care – PPO | Admitting: Internal Medicine

## 2020-03-02 ENCOUNTER — Inpatient Hospital Stay: Payer: BC Managed Care – PPO

## 2020-03-02 ENCOUNTER — Other Ambulatory Visit: Payer: Self-pay

## 2020-03-02 ENCOUNTER — Inpatient Hospital Stay: Payer: BC Managed Care – PPO | Admitting: Internal Medicine

## 2020-03-02 ENCOUNTER — Inpatient Hospital Stay: Payer: BC Managed Care – PPO | Attending: Internal Medicine

## 2020-03-02 VITALS — BP 129/80 | HR 57 | Temp 98.0°F

## 2020-03-02 DIAGNOSIS — C719 Malignant neoplasm of brain, unspecified: Secondary | ICD-10-CM

## 2020-03-02 DIAGNOSIS — C714 Malignant neoplasm of occipital lobe: Secondary | ICD-10-CM | POA: Diagnosis present

## 2020-03-02 DIAGNOSIS — Z87891 Personal history of nicotine dependence: Secondary | ICD-10-CM | POA: Diagnosis not present

## 2020-03-02 DIAGNOSIS — Z9221 Personal history of antineoplastic chemotherapy: Secondary | ICD-10-CM | POA: Diagnosis not present

## 2020-03-02 DIAGNOSIS — Z923 Personal history of irradiation: Secondary | ICD-10-CM | POA: Insufficient documentation

## 2020-03-02 DIAGNOSIS — G40909 Epilepsy, unspecified, not intractable, without status epilepticus: Secondary | ICD-10-CM | POA: Diagnosis not present

## 2020-03-02 DIAGNOSIS — Z79899 Other long term (current) drug therapy: Secondary | ICD-10-CM | POA: Diagnosis not present

## 2020-03-02 LAB — CBC WITH DIFFERENTIAL (CANCER CENTER ONLY)
Abs Immature Granulocytes: 0.03 10*3/uL (ref 0.00–0.07)
Basophils Absolute: 0 10*3/uL (ref 0.0–0.1)
Basophils Relative: 1 %
Eosinophils Absolute: 0.1 10*3/uL (ref 0.0–0.5)
Eosinophils Relative: 2 %
HCT: 42.7 % (ref 39.0–52.0)
Hemoglobin: 16 g/dL (ref 13.0–17.0)
Immature Granulocytes: 1 %
Lymphocytes Relative: 26 %
Lymphs Abs: 1.4 10*3/uL (ref 0.7–4.0)
MCH: 33.3 pg (ref 26.0–34.0)
MCHC: 37.5 g/dL — ABNORMAL HIGH (ref 30.0–36.0)
MCV: 88.8 fL (ref 80.0–100.0)
Monocytes Absolute: 0.3 10*3/uL (ref 0.1–1.0)
Monocytes Relative: 5 %
Neutro Abs: 3.6 10*3/uL (ref 1.7–7.7)
Neutrophils Relative %: 65 %
Platelet Count: 165 10*3/uL (ref 150–400)
RBC: 4.81 MIL/uL (ref 4.22–5.81)
RDW: 11.3 % — ABNORMAL LOW (ref 11.5–15.5)
WBC Count: 5.4 10*3/uL (ref 4.0–10.5)
nRBC: 0 % (ref 0.0–0.2)

## 2020-03-02 LAB — CMP (CANCER CENTER ONLY)
ALT: 27 U/L (ref 0–44)
AST: 21 U/L (ref 15–41)
Albumin: 3.9 g/dL (ref 3.5–5.0)
Alkaline Phosphatase: 67 U/L (ref 38–126)
Anion gap: 6 (ref 5–15)
BUN: 12 mg/dL (ref 6–20)
CO2: 26 mmol/L (ref 22–32)
Calcium: 8.6 mg/dL — ABNORMAL LOW (ref 8.9–10.3)
Chloride: 107 mmol/L (ref 98–111)
Creatinine: 1.16 mg/dL (ref 0.61–1.24)
GFR, Estimated: >60 mL/min (ref >60)
Glucose, Bld: 104 mg/dL — ABNORMAL HIGH (ref 70–99)
Potassium: 4.1 mmol/L (ref 3.5–5.1)
Sodium: 139 mmol/L (ref 135–145)
Total Bilirubin: 1.1 mg/dL (ref 0.3–1.2)
Total Protein: 6.4 g/dL — ABNORMAL LOW (ref 6.5–8.1)

## 2020-03-02 NOTE — Progress Notes (Signed)
[facility] at [place], [place] [address] [phone]   Interval Evaluation  Date of Service: 03/02/20 Patient Name: Robert Watkins Patient MRN: [id number] Patient DOB: Sep 18, 1985 Provider: Ventura Sellers, MD  Identifying Statement:  Robert Watkins is a 34 y.o. 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 Kristeen Miss in [place]. 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 [name]. Pathology confirms anaplastic astrocytoma.   06/01/2010 Clinical Event-Other  New patient evaluation at [facility]. Recommend radiation therapy with concurrent Temozolomide followed by twelve cycles of five-day Temozolomide.   06/13/2010 - 08/01/2010 Radiation  Radiation with Temodar.  06/13/2010 - 08/01/2010 Chemotherapy  Temodar 75 mg/m2 with radiation.  08/16/2010 - 08/07/2011 Chemotherapy  5 day Temodar 200 mg/m2  02/19/2018 Progression  Progression of non-enhancing tumor compared to 2013. Recommend initiating metronomic daily Temodar 50 mg/m2/day.  Interval History:  Robert Watkins presents today for follow up after labs and ECG prior to Tibsovo therapy.  No new or progressive neurologic deficits.  No headaches or seizures.  He remains active and functionally independent.  Currently working part time.  Medications: Current Outpatient Medications on File Prior to Visit  Medication Sig Dispense Refill  . levETIRAcetam (KEPPRA) 500 MG tablet Take 1 tablet (500 mg total) by mouth 2 (two) times daily. 60 tablet 0  . amLODipine (NORVASC) 2.5 MG tablet Take 1 tablet (2.5 mg total) by mouth daily. (Patient not taking: Reported on 06/18/2018) 30  tablet 0  . Ivosidenib 250 MG TABS Take 500 mg by mouth daily. (Patient not taking: Reported on 03/02/2020) 60 tablet 0  . ondansetron (ZOFRAN) 8 MG tablet Take 1 tablet (8 mg total) by mouth 2 (two) times daily as needed. Start on the third day after chemotherapy. (Patient not taking: Reported on 06/18/2018) 30 tablet 1   No current facility-administered medications on file prior to visit.    Allergies:  Allergies  Allergen Reactions  . Lamotrigine Rash   Past Medical History:  Past Medical History:  Diagnosis Date  . Cancer of brain ([place]) 04/07/2011  . 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 History of chemotherapy 06/13/2010-08/01/2010   42 day Temodar with concurrent Radiation  . History of chemotherapy 08/16/2010-08/07/2011   5 day Temodar 200 mg/m2  . History of radiation therapy 06/13/2010-08/01/2010   [facility]. Recommend radiation therapy with concurrent Temozolomide followed by twelve cycles of five-day Temozolomide. ?  . Seizure disorder, secondary ([place]) 04/07/2011   Past Surgical History:  Past Surgical History:  Procedure Laterality Date  . CRANIECTOMY / CRANIOTOMY FOR EXCISION OF BRAIN TUMOR Left 05/26/2010   Left parietooccipital craniotomy with tumor resection by Dr. [name]. Pathology confirms anaplastic astrocytoma. ?  . CRANIOTOMY FOR STEREOTACTIC GUIDED SURGERY Left 05/12/2010   Stereotactic biopsy of left occipital lesion by Dr. Kristeen Miss in [place]. Pathology demonstrates anaplastic astrocytoma (WHO III)?   Social History:  Social History   Socioeconomic History  . Marital status: Single    Spouse name: Not on file  . Number of children: Not on file  . Years of education: Not on file  . Highest education level: Not on file  Occupational History  . Not on file  Tobacco Use  . Smoking status: Former Research scientist (life sciences)  . Smokeless tobacco: Never Used  Substance and Sexual Activity  . Alcohol use: Yes     Alcohol/week: 4.0 standard drinks    Types: 4 Shots of liquor per week  . Drug use: Yes    Types: Marijuana  . Sexual activity: Yes  Other Topics Concern  . Not on file  Social History Narrative  . Not on file   Social Determinants of Health   Financial Resource Strain:   . Difficulty of Paying Living Expenses: Not on file  Food Insecurity:   . Worried About Charity fundraiser in the Last Year: Not on file  . Ran Out of Food in the Last Year: Not on file  Transportation Needs:   . Lack of Transportation (Medical): Not on file  . Lack of Transportation (Non-Medical): Not on file  Physical Activity:   . Days of Exercise per Week: Not on file  . Minutes of Exercise per Session: Not on file  Stress:   . Feeling of Stress : Not on file  Social Connections:   . Frequency of Communication with Friends and Family: Not on file  . Frequency of Social Gatherings with Friends and Family: Not on file  . Attends Religious Services: Not on file  . Active Member of Clubs or Organizations: Not on file  . Attends Archivist Meetings: Not on file  . Marital Status: Not on file  Intimate Partner Violence:   . Fear of Current or Ex-Partner: Not on file  . Emotionally Abused: Not on file  . Physically Abused: Not on file  . Sexually Abused: Not on file   Family History: No family history on file.  Review of Systems: Constitutional: Denies fevers, chills or abnormal weight loss Eyes: Denies blurriness of vision Ears, nose, mouth, throat, and face: Denies mucositis or sore throat Respiratory: Denies cough, dyspnea or wheezes Cardiovascular: Denies palpitation, chest discomfort or lower extremity swelling Gastrointestinal:  Denies nausea, constipation, diarrhea GU: Denies dysuria or incontinence Skin: Denies abnormal skin rashes Neurological: Per HPI Musculoskeletal: Denies joint pain, back or neck discomfort. No decrease in ROM Behavioral/Psych: Denies anxiety, disturbance in  thought content, and mood instability  Physical Exam: Vitals:   03/02/20 0855  BP: 129/80  Pulse: (!) 57  Temp: 98 F (36.7 C)  SpO2: 100%   KPS: 100. General: Alert, cooperative, pleasant, in no acute distress Head: Normal EENT: No conjunctival injection or scleral icterus. Oral mucosa moist Lungs: Resp effort normal Cardiac: Regular rate and rhythm Abdomen: Soft, non-distended abdomen Skin: No rashes cyanosis or petechiae. Extremities: No clubbing or edema  Neurologic Exam: Mental Status: Awake, alert, attentive to examiner. Oriented to self and environment. Language is fluent with intact comprehension.  Cranial Nerves: Visual acuity is grossly normal. Visual fields are full. Extra-ocular movements intact. No ptosis. Face is symmetric, tongue midline. Motor: Tone and bulk are normal. Power is full in both arms and legs. Reflexes are symmetric, no pathologic reflexes present. Intact finger to nose bilaterally Sensory: Intact to light touch and temperature Gait: Normal and tandem gait is normal.   Labs: I have reviewed the data as listed    Component Value Date/Time   NA 139 03/02/2020 0834   K 4.1 03/02/2020 0834   CL 107 03/02/2020 0834   CO2 26 03/02/2020 0834   GLUCOSE 104 (H) 03/02/2020 0834   BUN 12 03/02/2020 0834   CREATININE 1.16 03/02/2020 0834   CALCIUM 8.6 (  L) 03/02/2020 0834   PROT 6.4 (L) 03/02/2020 0834   ALBUMIN 3.9 03/02/2020 0834   AST 21 03/02/2020 0834   ALT 27 03/02/2020 0834   ALKPHOS 67 03/02/2020 0834   BILITOT 1.1 03/02/2020 0834   GFRNONAA >60 03/02/2020 0834   GFRAA >60 08/19/2018 0854   Lab Results  Component Value Date   WBC 5.4 03/02/2020   NEUTROABS 3.6 03/02/2020   HGB 16.0 03/02/2020   HCT 42.7 03/02/2020   MCV 88.8 03/02/2020   PLT 165 03/02/2020    Assessment/Plan 1. Anaplastic astrocytoma [facility])  Mr. Furr is clinically stable today.  We reviewed CBC, CMP and ECG  today. All are within normal limits and safe for dosing  Ivosidenib 500mg  daily.   We reviewed potential side effects including paresthesias, GI distress, fatigue, cytopenias, QT prolongation.  He may begin therapy today.  We will ask that her return for labs in 6 weeks, and in 3 months to clinic following MRI brain.  We appreciate the opportunity to participate in the care of Anmol Fleck.    All questions were answered. The patient knows to call the clinic with any problems, questions or concerns. No barriers to learning were detected.  I have spent a total of 30 minutes of face-to-face and non-face-to-face time, excluding clinical staff time, preparing to see patient, ordering tests and/or medications, counseling the patient, and care coordination    Ventura Sellers, MD Medical Director of Neuro-Oncology [facility] [place] 03/02/20 3:09 PM

## 2020-03-02 NOTE — Telephone Encounter (Signed)
Oral Chemotherapy Pharmacist Encounter  I spoke with patient for overview of new oral chemotherapy medication: Tibsovo (ivosidenib) for the treatment of progressive IDH1 mutated grade III anaplastic astrocytoma, planned duration until disease progression or unacceptable toxicity.   Counseled patient on administration, dosing, side effects, monitoring, drug-food interactions, safe handling, storage, and disposal.  Baseline EKG, CMP and CBC w/ Diff from 03/02/20 reviewed - OK per MD for patient to initiate therapy.   Patient will take Tibsovo 250 mg tablets, 2 tablets (500 mg) by mouth once daily, with or without food, at approximately the same time each day.  Patient informed that Tibsovo should not be taken with a high fat meal due to risk of very increased absorption.   Patient knows to avoid grapefruit and grapefruit juice while on therapy with Tibsovo.  Tibsovo start date: 03/02/20  Side effects include but not limited to: GI upset (nausea, diarrhea, abdominal pain, decreased appetite, vomiting, constipation), fatigue, cough, lower extremity edema and headache, and peripheral neuropathy.   Patient will obtain anti diarrheal and alert the office of 4 or more loose stools above baseline.  Reviewed with patient importance of keeping a medication schedule and plan for any missed doses. No barriers to medication adherence identified.  Medication reconciliation performed and medication/allergy list updated.  Patient is receiving Tibsovo through manufacturer assistance. Robert Watkins knows to call the manufacturer in advance for refills to prevent a break in therapy.   All questions answered.  Robert Watkins voiced understanding and appreciation.   Medication education handout placed in mail for patient. Patient knows to call the office with questions or concerns. Oral Chemotherapy Clinic phone number provided to patient.   Leron Croak, PharmD, BCPS Hematology/Oncology Clinical  Pharmacist [facility] [phone] 03/02/2020 2:51 PM

## 2020-03-03 ENCOUNTER — Telehealth: Payer: Self-pay | Admitting: Internal Medicine

## 2020-03-03 NOTE — Telephone Encounter (Signed)
Scheduled per 11/9 los. Pt is aware of appt times and date.

## 2020-03-10 ENCOUNTER — Telehealth: Payer: Self-pay | Admitting: *Deleted

## 2020-03-10 NOTE — Telephone Encounter (Signed)
Received call from patient. He is inquiring about getting the Covid Booster vaccine while on Ivosidenib. Advised that I would pose this question to Dr. Mickeal Skinner and get back to him on Thursday, 03/11/20 He voiced understanding

## 2020-03-11 NOTE — Telephone Encounter (Signed)
Yes ok with covid booster  Ventura Sellers, MD

## 2020-03-12 ENCOUNTER — Encounter: Payer: Self-pay | Admitting: *Deleted

## 2020-03-19 ENCOUNTER — Other Ambulatory Visit: Payer: Self-pay | Admitting: Internal Medicine

## 2020-03-22 ENCOUNTER — Ambulatory Visit: Payer: BC Managed Care – PPO | Attending: Internal Medicine

## 2020-03-22 DIAGNOSIS — Z23 Encounter for immunization: Secondary | ICD-10-CM

## 2020-03-22 NOTE — Progress Notes (Signed)
   Covid-19 Vaccination Clinic  Name:  Robert Watkins    MRN: [id number] DOB: [birth year]  03/22/2020  Mr. Robert Watkins was observed post Covid-19 immunization for 15 minutes without incident. He was provided with Vaccine Information Sheet and instruction to access the V-Safe system.   Mr. Robert Watkins was instructed to call 911 with any severe reactions post vaccine: Marland Kitchen Difficulty breathing  . Swelling of face and throat  . A fast heartbeat  . A bad rash all over body  . Dizziness and weakness   Immunizations Administered    No immunizations on file.

## 2020-04-13 ENCOUNTER — Inpatient Hospital Stay: Payer: BC Managed Care – PPO

## 2020-04-13 ENCOUNTER — Inpatient Hospital Stay: Payer: BC Managed Care – PPO | Attending: Internal Medicine | Admitting: Internal Medicine

## 2020-04-13 ENCOUNTER — Other Ambulatory Visit: Payer: Self-pay

## 2020-04-13 DIAGNOSIS — C719 Malignant neoplasm of brain, unspecified: Secondary | ICD-10-CM | POA: Diagnosis not present

## 2020-04-13 DIAGNOSIS — Z9221 Personal history of antineoplastic chemotherapy: Secondary | ICD-10-CM | POA: Insufficient documentation

## 2020-04-13 DIAGNOSIS — C714 Malignant neoplasm of occipital lobe: Secondary | ICD-10-CM | POA: Insufficient documentation

## 2020-04-13 LAB — CBC WITH DIFFERENTIAL (CANCER CENTER ONLY)
Abs Immature Granulocytes: 0.02 10*3/uL (ref 0.00–0.07)
Basophils Absolute: 0 10*3/uL (ref 0.0–0.1)
Basophils Relative: 1 %
Eosinophils Absolute: 0.1 10*3/uL (ref 0.0–0.5)
Eosinophils Relative: 2 %
HCT: 45 % (ref 39.0–52.0)
Hemoglobin: 16.7 g/dL (ref 13.0–17.0)
Immature Granulocytes: 0 %
Lymphocytes Relative: 27 %
Lymphs Abs: 1.5 10*3/uL (ref 0.7–4.0)
MCH: 33.1 pg (ref 26.0–34.0)
MCHC: 37.1 g/dL — ABNORMAL HIGH (ref 30.0–36.0)
MCV: 89.3 fL (ref 80.0–100.0)
Monocytes Absolute: 0.3 10*3/uL (ref 0.1–1.0)
Monocytes Relative: 6 %
Neutro Abs: 3.6 10*3/uL (ref 1.7–7.7)
Neutrophils Relative %: 64 %
Platelet Count: 151 10*3/uL (ref 150–400)
RBC: 5.04 MIL/uL (ref 4.22–5.81)
RDW: 11.8 % (ref 11.5–15.5)
WBC Count: 5.7 10*3/uL (ref 4.0–10.5)
nRBC: 0 % (ref 0.0–0.2)

## 2020-04-13 LAB — CMP (CANCER CENTER ONLY)
ALT: 29 U/L (ref 0–44)
AST: 22 U/L (ref 15–41)
Albumin: 4 g/dL (ref 3.5–5.0)
Alkaline Phosphatase: 66 U/L (ref 38–126)
Anion gap: 7 (ref 5–15)
BUN: 15 mg/dL (ref 6–20)
CO2: 27 mmol/L (ref 22–32)
Calcium: 9 mg/dL (ref 8.9–10.3)
Chloride: 106 mmol/L (ref 98–111)
Creatinine: 1.12 mg/dL (ref 0.61–1.24)
GFR, Estimated: >60 mL/min (ref >60)
Glucose, Bld: 108 mg/dL — ABNORMAL HIGH (ref 70–99)
Potassium: 3.6 mmol/L (ref 3.5–5.1)
Sodium: 140 mmol/L (ref 135–145)
Total Bilirubin: 0.4 mg/dL (ref 0.3–1.2)
Total Protein: 6.7 g/dL (ref 6.5–8.1)

## 2020-04-13 NOTE — Progress Notes (Signed)
I connected with Robert Watkins on 04/13/20 at  9:30 AM EST by telephone visit and verified that I am speaking with the correct person using two identifiers.  I discussed the limitations, risks, security and privacy concerns of performing an evaluation and management service by telemedicine and the availability of in-person appointments. I also discussed with the patient that there may be a patient responsible charge related to this service. The patient expressed understanding and agreed to proceed.  Other persons participating in the visit and their role in the encounter:  n/a  [name] location:  Home  Provider's location:  Office  Chief Complaint:  Anaplastic astrocytoma [facility])  Oncology History  Anaplastic astrocytoma ([place])  04/07/2011 Initial Diagnosis   Anaplastic astrocytoma ([place])   03/12/2018 -  Chemotherapy   The patient had temozolomide (TEMODAR) 100 MG capsule, 100 mg, Oral, Daily, 1 of 1 cycle, Start date: 08/20/2018, End date: 09/05/2018 Dose modification: 100 mg (original dose 100 mg, Cycle 1)  for chemotherapy treatment.    03/02/2020 -  Chemotherapy   Progression of disease; initiates Tibsovo 500mg  daily     History of Present Ilness: Robert Watkins describes no new or progressive neurologic deficits.  No seizures or headaches.  Mild GI upset with Tibsovo, otherwise well tolerated. Observations: Language and cognition at baseline  Lab Results  Component Value Date   WBC 5.7 04/13/2020   HGB 16.7 04/13/2020   HCT 45.0 04/13/2020   MCV 89.3 04/13/2020   PLT 151 04/13/2020   Assessment and Plan: Anaplastic astrocytoma ([place])  Con't Tibsovo 500mg  daily  Follow Up Instructions: We ask that Robert Watkins return to clinic in 1 months following next brain MRI, or sooner as needed.  I discussed the assessment and treatment plan with the patient.  The patient was provided an opportunity to ask questions and all were answered.  The patient agreed with the plan and demonstrated  understanding of the instructions.    The patient was advised to call back or seek an in-person evaluation if the symptoms worsen or if the condition fails to improve as anticipated.  I provided 5-10 minutes of non-face-to-face time during this enocunter.  Ventura Sellers, MD   I provided 15 minutes of non face-to-face telephone visit time during this encounter, and > 50% was spent counseling as documented under my assessment & plan.

## 2020-04-14 ENCOUNTER — Telehealth: Payer: Self-pay | Admitting: Internal Medicine

## 2020-04-14 ENCOUNTER — Other Ambulatory Visit: Payer: Self-pay | Admitting: Internal Medicine

## 2020-04-14 NOTE — Telephone Encounter (Signed)
Scheduled follow-up appointment. Patient is aware. 

## 2020-04-14 NOTE — Telephone Encounter (Signed)
Refill request

## 2020-04-15 NOTE — Telephone Encounter (Signed)
Rx Request 

## 2020-04-27 ENCOUNTER — Other Ambulatory Visit: Payer: Self-pay | Admitting: *Deleted

## 2020-05-07 ENCOUNTER — Other Ambulatory Visit: Payer: Self-pay

## 2020-05-07 ENCOUNTER — Ambulatory Visit
Admission: RE | Admit: 2020-05-07 | Discharge: 2020-05-07 | Disposition: A | Discharge status: Home / Self Care | Payer: BC Managed Care – PPO | Source: Ambulatory Visit | Attending: Internal Medicine | Admitting: Internal Medicine

## 2020-05-07 DIAGNOSIS — C719 Malignant neoplasm of brain, unspecified: Secondary | ICD-10-CM

## 2020-05-07 MED ORDER — GADOBENATE DIMEGLUMINE 529 MG/ML IV SOLN
15.0000 mL | Freq: Once | INTRAVENOUS | Status: AC | PRN
  Administered 2020-05-07: 15 mL via INTRAVENOUS

## 2020-05-12 ENCOUNTER — Other Ambulatory Visit: Payer: Self-pay | Admitting: Internal Medicine

## 2020-05-12 NOTE — Telephone Encounter (Signed)
Refill request

## 2020-05-18 ENCOUNTER — Other Ambulatory Visit: Payer: Self-pay

## 2020-05-18 ENCOUNTER — Inpatient Hospital Stay: Payer: BC Managed Care – PPO | Attending: Internal Medicine | Admitting: Internal Medicine

## 2020-05-18 VITALS — BP 132/83 | HR 65 | Temp 97.5°F | Resp 16 | Ht 71.0 in | Wt 197.5 lb

## 2020-05-18 DIAGNOSIS — G40909 Epilepsy, unspecified, not intractable, without status epilepticus: Secondary | ICD-10-CM | POA: Diagnosis not present

## 2020-05-18 DIAGNOSIS — C714 Malignant neoplasm of occipital lobe: Secondary | ICD-10-CM | POA: Insufficient documentation

## 2020-05-18 DIAGNOSIS — Z923 Personal history of irradiation: Secondary | ICD-10-CM | POA: Insufficient documentation

## 2020-05-18 DIAGNOSIS — C719 Malignant neoplasm of brain, unspecified: Secondary | ICD-10-CM | POA: Diagnosis not present

## 2020-05-18 DIAGNOSIS — Z87891 Personal history of nicotine dependence: Secondary | ICD-10-CM | POA: Diagnosis not present

## 2020-05-18 DIAGNOSIS — F329 Major depressive disorder, single episode, unspecified: Secondary | ICD-10-CM | POA: Insufficient documentation

## 2020-05-18 DIAGNOSIS — Z9221 Personal history of antineoplastic chemotherapy: Secondary | ICD-10-CM | POA: Insufficient documentation

## 2020-05-18 DIAGNOSIS — Z79899 Other long term (current) drug therapy: Secondary | ICD-10-CM | POA: Insufficient documentation

## 2020-05-19 NOTE — Progress Notes (Signed)
[name] [place], [place] [address] [phone]   Interval Evaluation  Date of Service: 05/19/20 Patient Name: Robert Watkins Patient MRN: [id number] Patient DOB: 1986-02-22 Provider: Ventura Sellers, MD  Identifying Statement:  Robert Watkins is a 35 y.o. 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 Kristeen Miss in [place]. 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 [name]. Pathology confirms anaplastic astrocytoma.   06/01/2010 Clinical Event-Other  New patient evaluation at [facility] [place] [facility] Recommend radiation therapy with concurrent Temozolomide followed by twelve cycles of five-day Temozolomide.   06/13/2010 - 08/01/2010 Radiation  Radiation with Temodar.  06/13/2010 - 08/01/2010 Chemotherapy  Temodar 75 mg/m2 with radiation.  08/16/2010 - 08/07/2011 Chemotherapy  5 day Temodar 200 mg/m2  02/19/2018 Progression  Progression of non-enhancing tumor compared to 2013. Recommend initiating metronomic daily Temodar 50 mg/m2/day.  Interval History:  Robert Watkins presents today for follow up after recent MRI brain.  No new or progressive neurologic deficits.  He does describe slight increase in fatigue since starting the medication.  No headaches or seizures.  He remains active and functionally independent.  Currently working part time.  Medications: Current Outpatient Medications on File Prior to Visit  Medication Sig Dispense Refill  . amLODipine (NORVASC) 2.5 MG tablet Take 1 tablet (2.5 mg total) by mouth daily. (Patient not taking: Reported on 06/18/2018) 30 tablet 0  . levETIRAcetam (KEPPRA) 500 MG tablet Take 1  tablet (500 mg total) by mouth 2 (two) times daily. 60 tablet 0  . ondansetron (ZOFRAN) 8 MG tablet Take 1 tablet (8 mg total) by mouth 2 (two) times daily as needed. Start on the third day after chemotherapy. (Patient not taking: Reported on 06/18/2018) 30 tablet 1  . TIBSOVO 250 MG TABS TAKE 2 TABLETS (500MG ) BY MOUTH ONCE DAILY 60 tablet 0   No current facility-administered medications on file prior to visit.    Allergies:  Allergies  Allergen Reactions  . Lamotrigine Rash   Past Medical History:  Past Medical History:  Diagnosis Date  . Cancer of brain ([place]) 04/07/2011  . 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 History of chemotherapy 06/13/2010-08/01/2010   42 day Temodar with concurrent Radiation  . History of chemotherapy 08/16/2010-08/07/2011   5 day Temodar 200 mg/m2  . History of radiation therapy 06/13/2010-08/01/2010   [facility] [place] [facility]. Recommend radiation therapy with concurrent Temozolomide followed by twelve cycles of five-day Temozolomide. ?  . Seizure disorder, secondary ([place]) 04/07/2011   Past Surgical History:  Past Surgical History:  Procedure Laterality Date  . CRANIECTOMY / CRANIOTOMY FOR EXCISION OF BRAIN TUMOR Left 05/26/2010   Left parietooccipital craniotomy with tumor resection by Dr. [name]. Pathology confirms anaplastic astrocytoma. ?  . CRANIOTOMY FOR STEREOTACTIC GUIDED SURGERY Left 05/12/2010   Stereotactic biopsy of left occipital lesion by Dr. Kristeen Miss in [place]. Pathology demonstrates anaplastic astrocytoma (WHO III)?   Social History:  Social History   Socioeconomic History  . Marital status: Single    Spouse name: Not on file  . Number of children: Not on file  . Years of education: Not on file  . Highest education level: Not on file  Occupational History  .  Not on file  Tobacco Use  . Smoking status: Former Research scientist (life sciences)  . Smokeless tobacco: Never Used  Substance and Sexual Activity  . Alcohol  use: Yes    Alcohol/week: 4.0 standard drinks    Types: 4 Shots of liquor per week  . Drug use: Yes    Types: Marijuana  . Sexual activity: Yes  Other Topics Concern  . Not on file  Social History Narrative  . Not on file   Social Determinants of Health   Financial Resource Strain: Not on file  Food Insecurity: Not on file  Transportation Needs: Not on file  Physical Activity: Not on file  Stress: Not on file  Social Connections: Not on file  Intimate Partner Violence: Not on file   Family History: No family history on file.  Review of Systems: Constitutional: Denies fevers, chills or abnormal weight loss Eyes: Denies blurriness of vision Ears, nose, mouth, throat, and face: Denies mucositis or sore throat Respiratory: Denies cough, dyspnea or wheezes Cardiovascular: Denies palpitation, chest discomfort or lower extremity swelling Gastrointestinal:  Denies nausea, constipation, diarrhea GU: Denies dysuria or incontinence Skin: Denies abnormal skin rashes Neurological: Per HPI Musculoskeletal: Denies joint pain, back or neck discomfort. No decrease in ROM Behavioral/Psych: Denies anxiety, disturbance in thought content, and mood instability  Physical Exam: Vitals:   05/18/20 0916  BP: 132/83  Pulse: 65  Resp: 16  Temp: (!) 97.5 F (36.4 C)  SpO2: 100%   KPS: 100. General: Alert, cooperative, pleasant, in no acute distress Head: Normal EENT: No conjunctival injection or scleral icterus. Oral mucosa moist Lungs: Resp effort normal Cardiac: Regular rate and rhythm Abdomen: Soft, non-distended abdomen Skin: No rashes cyanosis or petechiae. Extremities: No clubbing or edema  Neurologic Exam: Mental Status: Awake, alert, attentive to examiner. Oriented to self and environment. Language is fluent with intact comprehension.  Cranial Nerves: Visual acuity is grossly normal. Visual fields are full. Extra-ocular movements intact. No ptosis. Face is symmetric, tongue  midline. Motor: Tone and bulk are normal. Power is full in both arms and legs. Reflexes are symmetric, no pathologic reflexes present. Intact finger to nose bilaterally Sensory: Intact to light touch and temperature Gait: Normal and tandem gait is normal.   Labs: I have reviewed the data as listed    Component Value Date/Time   NA 140 04/13/2020 0806   K 3.6 04/13/2020 0806   CL 106 04/13/2020 0806   CO2 27 04/13/2020 0806   GLUCOSE 108 (H) 04/13/2020 0806   BUN 15 04/13/2020 0806   CREATININE 1.12 04/13/2020 0806   CALCIUM 9.0 04/13/2020 0806   PROT 6.7 04/13/2020 0806   ALBUMIN 4.0 04/13/2020 0806   AST 22 04/13/2020 0806   ALT 29 04/13/2020 0806   ALKPHOS 66 04/13/2020 0806   BILITOT 0.4 04/13/2020 0806   GFRNONAA >60 04/13/2020 0806   GFRAA >60 08/19/2018 0854   Lab Results  Component Value Date   WBC 5.7 04/13/2020   NEUTROABS 3.6 04/13/2020   HGB 16.7 04/13/2020   HCT 45.0 04/13/2020   MCV 89.3 04/13/2020   PLT 151 04/13/2020   Imaging:  [place] Clinician Interpretation: I have personally reviewed the CNS images as listed.  My interpretation, in the context of the patient's clinical presentation, is stable disease  MR BRAIN W WO CONTRAST  Result Date: 05/07/2020 CLINICAL DATA:  Anaplastic astrocytoma, progression of nonenhancing tumor October 2021, started on chemotherapy, follow-up EXAM: MRI HEAD WITHOUT AND WITH CONTRAST TECHNIQUE: Multiplanar, multiecho pulse sequences of  the brain and surrounding structures were obtained without and with intravenous contrast. CONTRAST:  60mL MULTIHANCE GADOBENATE DIMEGLUMINE 529 MG/ML IV SOLN COMPARISON:  02/01/2020 FINDINGS: Brain: Left occipital CSF intensity resection cavity is again identified with ex vacuo dilatation of the posterior left lateral ventricle. Adjacent T2 FLAIR hyperintensity appears slightly increased since 02/01/2020. There are several new punctate foci of enhancement at the margins of the abnormal T2 FLAIR  hyperintensity posteriosuperiorly (for example series 11, images 79 and 84). There is no acute infarction or intracranial hemorrhage. 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 There is no hydrocephalus. Nonexpansile relative T2 hyperintensity of the left cerebral cortex is presumed to be on a technical basis. Vascular: Major vessel flow voids at the skull base are preserved. Skull and upper cervical spine: Left craniotomy. Normal marrow signal is preserved. Sinuses/Orbits: Paranasal sinuses are aerated. Orbits are unremarkable. Other: Sella is unremarkable.  Mastoid air cells are clear. IMPRESSION: Slight increase in T2 FLAIR hyperintensity adjacent to occipital resection cavity with several new punctate foci of enhancement. Disease progression is suspected. These results will be called to the ordering clinician or representative by the Radiologist Assistant, and communication documented in the PACS or [employer]. Electronically Signed   By: Macy Mis M.D.   On: 05/07/2020 10:40   Assessment/Plan 1. Anaplastic astrocytoma [facility])  Mr. Coggins is clinically stable today.  MRI demonstrates subtle changes which are of unclear etiology.  The small foci of enhancement may represent imaging artifact given recent updates to imaging software.  There is no associated mass effect, and any change in T2 signal abnormality is very minimal.  Ok to continue dosing Ivosidenib 500mg  daily.   Case and imaging were reviewed virtually with Dr. Imagene Riches of [place].  We ask that Leonardo Koob return to clinic in 2 months following next brain MRI given changes seen today, or sooner as needed.  We appreciate the opportunity to participate in the care of Taylan Stitts.    All questions were answered. The patient knows to call the clinic with any problems, questions or concerns. No barriers to learning were detected.  I have spent a total of 40 minutes of face-to-face and  non-face-to-face time, excluding clinical staff time, preparing to see patient, ordering tests and/or medications, counseling the patient, and care coordination    Ventura Sellers, MD Medical Director of Neuro-Oncology [facility] [place] 05/19/20 1:33 PM

## 2020-05-20 ENCOUNTER — Other Ambulatory Visit: Payer: Self-pay | Admitting: Radiation Therapy

## 2020-06-04 ENCOUNTER — Other Ambulatory Visit: Payer: Self-pay | Admitting: Internal Medicine

## 2020-06-04 NOTE — Telephone Encounter (Signed)
Rx Refill for review

## 2020-07-08 ENCOUNTER — Other Ambulatory Visit: Payer: Self-pay | Admitting: Internal Medicine

## 2020-07-16 ENCOUNTER — Other Ambulatory Visit: Payer: Self-pay

## 2020-07-16 ENCOUNTER — Ambulatory Visit
Admission: RE | Admit: 2020-07-16 | Discharge: 2020-07-16 | Disposition: A | Discharge status: Home / Self Care | Payer: BC Managed Care – PPO | Source: Ambulatory Visit | Attending: Internal Medicine | Admitting: Internal Medicine

## 2020-07-16 DIAGNOSIS — C719 Malignant neoplasm of brain, unspecified: Secondary | ICD-10-CM

## 2020-07-16 MED ORDER — GADOBENATE DIMEGLUMINE 529 MG/ML IV SOLN
18.0000 mL | Freq: Once | INTRAVENOUS | Status: AC | PRN
  Administered 2020-07-16: 18 mL via INTRAVENOUS

## 2020-07-19 ENCOUNTER — Inpatient Hospital Stay: Payer: BC Managed Care – PPO | Attending: Internal Medicine

## 2020-07-19 DIAGNOSIS — C714 Malignant neoplasm of occipital lobe: Secondary | ICD-10-CM | POA: Insufficient documentation

## 2020-07-19 DIAGNOSIS — Z79899 Other long term (current) drug therapy: Secondary | ICD-10-CM | POA: Insufficient documentation

## 2020-07-19 DIAGNOSIS — R5383 Other fatigue: Secondary | ICD-10-CM | POA: Insufficient documentation

## 2020-07-19 DIAGNOSIS — Z87891 Personal history of nicotine dependence: Secondary | ICD-10-CM | POA: Insufficient documentation

## 2020-07-20 ENCOUNTER — Other Ambulatory Visit: Payer: Self-pay

## 2020-07-20 ENCOUNTER — Other Ambulatory Visit: Payer: Self-pay | Admitting: *Deleted

## 2020-07-20 ENCOUNTER — Inpatient Hospital Stay: Payer: BC Managed Care – PPO

## 2020-07-20 ENCOUNTER — Inpatient Hospital Stay: Payer: BC Managed Care – PPO | Admitting: Internal Medicine

## 2020-07-20 VITALS — BP 130/76 | HR 65 | Temp 97.1°F | Resp 19 | Ht 71.0 in | Wt 199.9 lb

## 2020-07-20 DIAGNOSIS — G40909 Epilepsy, unspecified, not intractable, without status epilepticus: Secondary | ICD-10-CM

## 2020-07-20 DIAGNOSIS — C719 Malignant neoplasm of brain, unspecified: Secondary | ICD-10-CM

## 2020-07-20 DIAGNOSIS — R5383 Other fatigue: Secondary | ICD-10-CM | POA: Diagnosis not present

## 2020-07-20 DIAGNOSIS — Z79899 Other long term (current) drug therapy: Secondary | ICD-10-CM | POA: Diagnosis not present

## 2020-07-20 DIAGNOSIS — Z87891 Personal history of nicotine dependence: Secondary | ICD-10-CM | POA: Diagnosis not present

## 2020-07-20 DIAGNOSIS — C714 Malignant neoplasm of occipital lobe: Secondary | ICD-10-CM | POA: Diagnosis not present

## 2020-07-20 LAB — CMP (CANCER CENTER ONLY)
ALT: 34 U/L (ref 0–44)
AST: 21 U/L (ref 15–41)
Albumin: 4.3 g/dL (ref 3.5–5.0)
Alkaline Phosphatase: 70 U/L (ref 38–126)
Anion gap: 9 (ref 5–15)
BUN: 15 mg/dL (ref 6–20)
CO2: 26 mmol/L (ref 22–32)
Calcium: 9.1 mg/dL (ref 8.9–10.3)
Chloride: 104 mmol/L (ref 98–111)
Creatinine: 0.98 mg/dL (ref 0.61–1.24)
GFR, Estimated: >60 mL/min (ref >60)
Glucose, Bld: 113 mg/dL — ABNORMAL HIGH (ref 70–99)
Potassium: 4.5 mmol/L (ref 3.5–5.1)
Sodium: 139 mmol/L (ref 135–145)
Total Bilirubin: 0.7 mg/dL (ref 0.3–1.2)
Total Protein: 7 g/dL (ref 6.5–8.1)

## 2020-07-20 LAB — CBC WITH DIFFERENTIAL (CANCER CENTER ONLY)
Abs Immature Granulocytes: 0.06 10*3/uL (ref 0.00–0.07)
Basophils Absolute: 0 10*3/uL (ref 0.0–0.1)
Basophils Relative: 1 %
Eosinophils Absolute: 0.1 10*3/uL (ref 0.0–0.5)
Eosinophils Relative: 2 %
HCT: 47 % (ref 39.0–52.0)
Hemoglobin: 17.6 g/dL — ABNORMAL HIGH (ref 13.0–17.0)
Immature Granulocytes: 1 %
Lymphocytes Relative: 21 %
Lymphs Abs: 1 10*3/uL (ref 0.7–4.0)
MCH: 33.7 pg (ref 26.0–34.0)
MCHC: 37.4 g/dL — ABNORMAL HIGH (ref 30.0–36.0)
MCV: 89.9 fL (ref 80.0–100.0)
Monocytes Absolute: 0.3 10*3/uL (ref 0.1–1.0)
Monocytes Relative: 5 %
Neutro Abs: 3.5 10*3/uL (ref 1.7–7.7)
Neutrophils Relative %: 70 %
Platelet Count: 158 10*3/uL (ref 150–400)
RBC: 5.23 MIL/uL (ref 4.22–5.81)
RDW: 11.6 % (ref 11.5–15.5)
WBC Count: 4.9 10*3/uL (ref 4.0–10.5)
nRBC: 0 % (ref 0.0–0.2)

## 2020-07-20 MED ORDER — LEVETIRACETAM 500 MG PO TABS: 500.0000 mg | ORAL_TABLET | Freq: Two times a day (BID) | ORAL | 3 refills | Status: DC

## 2020-07-20 NOTE — Progress Notes (Signed)
[place] at [place],  [address] [phone]   Interval Evaluation  Date of Service: 07/20/20 Patient Name: Robert Watkins Patient MRN: [id number] Patient DOB: 1986-04-03 Provider: Ventura Sellers, MD  Identifying Statement:  Robert Watkins is a 35 y.o. 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 Kristeen Miss in [place]. 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 [name]. Pathology confirms anaplastic astrocytoma.   06/01/2010 Clinical Event-Other  New patient evaluation at [facility] [place] [facility]. Recommend radiation therapy with concurrent Temozolomide followed by twelve cycles of five-day Temozolomide.   06/13/2010 - 08/01/2010 Radiation  Radiation with Temodar.  06/13/2010 - 08/01/2010 Chemotherapy  Temodar 75 mg/m2 with radiation.  08/16/2010 - 08/07/2011 Chemotherapy  5 day Temodar 200 mg/m2  02/19/2018 Progression  Progression of non-enhancing tumor compared to 2013. Recommend initiating metronomic daily Temodar 50 mg/m2/day.  Interval History:  Robert Watkins presents today for follow up after recent MRI brain.  No new or progressive neurologic deficits.  Continues to describe modest fatigue.  No headaches or seizures.  He remains active and functionally independent.  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  Medications: Current Outpatient Medications on File Prior to Visit  Medication Sig Dispense Refill  . amLODipine (NORVASC) 2.5 MG tablet Take 1 tablet (2.5 mg total) by mouth daily. (Patient not taking: Reported on 06/18/2018) 30 tablet 0  . levETIRAcetam (KEPPRA) 500 MG tablet Take 1 tablet (500 mg  total) by mouth 2 (two) times daily. 60 tablet 0  . ondansetron (ZOFRAN) 8 MG tablet Take 1 tablet (8 mg total) by mouth 2 (two) times daily as needed. Start on the third day after chemotherapy. (Patient not taking: Reported on 06/18/2018) 30 tablet 1  . TIBSOVO 250 MG TABS TAKE 2 TABLETS (500MG ) BY MOUTH ONCE DAILY 60 tablet 0   No current facility-administered medications on file prior to visit.    Allergies:  Allergies  Allergen Reactions  . Lamotrigine Rash   Past Medical History:  Past Medical History:  Diagnosis Date  . Cancer of brain ([place]) 04/07/2011  . 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 History of chemotherapy 06/13/2010-08/01/2010   42 day Temodar with concurrent Radiation  . History of chemotherapy 08/16/2010-08/07/2011   5 day Temodar 200 mg/m2  . History of radiation therapy 06/13/2010-08/01/2010   [facility] [place] [facility]. Recommend radiation therapy with concurrent Temozolomide followed by twelve cycles of five-day Temozolomide. ?  . Seizure disorder, secondary ([place]) 04/07/2011   Past Surgical History:  Past Surgical History:  Procedure Laterality Date  . CRANIECTOMY / CRANIOTOMY FOR EXCISION OF BRAIN TUMOR Left 05/26/2010   Left parietooccipital craniotomy with tumor resection by Dr. [name]. Pathology confirms anaplastic astrocytoma. ?  . CRANIOTOMY FOR STEREOTACTIC GUIDED SURGERY Left 05/12/2010   Stereotactic biopsy of left occipital lesion by Dr. Kristeen Miss in [place]. Pathology demonstrates anaplastic astrocytoma (WHO III)?   Social History:  Social History   Socioeconomic History  . Marital status: Single    Spouse name: Not on file  . Number of children: Not on file  . Years of education: Not on file  . Highest education level: Not on file  Occupational History  . Not on file  Tobacco Use  . Smoking status: Former Research scientist (life sciences)  . Smokeless tobacco: Never Used  Substance and Sexual Activity  . Alcohol use: Yes     Alcohol/week: 4.0 standard drinks    Types: 4 Shots of liquor per week  . Drug use: Yes    Types: Marijuana  . Sexual activity: Yes  Other Topics Concern  . Not on file  Social History Narrative  . Not on file   Social Determinants of Health   Financial Resource Strain: Not on file  Food Insecurity: Not on file  Transportation Needs: Not on file  Physical Activity: Not on file  Stress: Not on file  Social Connections: Not on file  Intimate Partner Violence: Not on file   Family History: No family history on file.  Review of Systems: Constitutional: Denies fevers, chills or abnormal weight loss Eyes: Denies blurriness of vision Ears, nose, mouth, throat, and face: Denies mucositis or sore throat Respiratory: Denies cough, dyspnea or wheezes Cardiovascular: Denies palpitation, chest discomfort or lower extremity swelling Gastrointestinal:  Denies nausea, constipation, diarrhea GU: Denies dysuria or incontinence Skin: Denies abnormal skin rashes Neurological: Per HPI Musculoskeletal: Denies joint pain, back or neck discomfort. No decrease in ROM Behavioral/Psych: Denies anxiety, disturbance in thought content, and mood instability  Physical Exam: Vitals:   07/20/20 1050  BP: 130/76  Pulse: 65  Resp: 19  Temp: (!) 97.1 F (36.2 C)  SpO2: 99%   KPS: 100. General: Alert, cooperative, pleasant, in no acute distress Head: Normal EENT: No conjunctival injection or scleral icterus. Oral mucosa moist Lungs: Resp effort normal Cardiac: Regular rate and rhythm Abdomen: Soft, non-distended abdomen Skin: No rashes cyanosis or petechiae. Extremities: No clubbing or edema  Neurologic Exam: Mental Status: Awake, alert, attentive to examiner. Oriented to self and environment. Language is fluent with intact comprehension.  Cranial Nerves: Visual acuity is grossly normal. Visual fields are full. Extra-ocular movements intact. No ptosis. Face is symmetric, tongue midline. Motor:  Tone and bulk are normal. Power is full in both arms and legs. Reflexes are symmetric, no pathologic reflexes present. Intact finger to nose bilaterally Sensory: Intact to light touch and temperature Gait: Normal and tandem gait is normal.   Labs: I have reviewed the data as listed    Component Value Date/Time   NA 140 04/13/2020 0806   K 3.6 04/13/2020 0806   CL 106 04/13/2020 0806   CO2 27 04/13/2020 0806   GLUCOSE 108 (H) 04/13/2020 0806   BUN 15 04/13/2020 0806   CREATININE 1.12 04/13/2020 0806   CALCIUM 9.0 04/13/2020 0806   PROT 6.7 04/13/2020 0806   ALBUMIN 4.0 04/13/2020 0806   AST 22 04/13/2020 0806   ALT 29 04/13/2020 0806   ALKPHOS 66 04/13/2020 0806   BILITOT 0.4 04/13/2020 0806   GFRNONAA >60 04/13/2020 0806   GFRAA >60 08/19/2018 0854   Lab Results  Component Value Date   WBC 4.9 07/20/2020   NEUTROABS 3.5 07/20/2020   HGB 17.6 (H) 07/20/2020   HCT 47.0 07/20/2020   MCV 89.9 07/20/2020   PLT 158 07/20/2020   Imaging:  [place] Clinician Interpretation: I have personally reviewed the CNS images as listed.  My interpretation, in the context of the patient's clinical presentation, is stable disease  MR BRAIN W WO CONTRAST  Result Date: 07/17/2020 CLINICAL DATA:  Anaplastic astrocytoma. Status post radiation therapy. EXAM: MRI HEAD WITHOUT AND WITH CONTRAST TECHNIQUE: Multiplanar, multiecho pulse sequences of the brain and surrounding structures were obtained without and  with intravenous contrast. CONTRAST:  42mL MULTIHANCE GADOBENATE DIMEGLUMINE 529 MG/ML IV SOLN COMPARISON:  MR head without and with contrast 05/07/2020 and 02/01/2020. FINDINGS: Brain: The area of T2 hyperintensity involving the left occipital lobe adjacent to lateral ventricle is stable. The central intensity on FLAIR images has decreased some. Punctate foci of enhancement noted on the prior exam are less conspicuous on today's study. The largest residual lesion is on image 81 of series 17. Faint  punctate focus is noted on image 86. No new foci of enhancement are present. Postoperative changes are present. Ex vacuo dilation of the left lateral ventricle noted. No distant enhancement or T2 signal changes present. No acute infarct or restricted diffusion. No significant extra-axial fluid collections are present. The brainstem and cerebellum are within normal limits. The internal auditory canals are within normal limits. Vascular: Flow is present in the major intracranial arteries. Skull and upper cervical spine: The craniocervical junction is normal. Upper cervical spine is within normal limits. Marrow signal is unremarkable. Sinuses/Orbits: Mild mucosal thickening is present in the inferior maxillary sinuses bilaterally. No fluid levels are present. The paranasal sinuses and mastoid air cells are otherwise clear. IMPRESSION: 1. Decreased conspicuity of punctate foci of enhancement in the left occipital lobe adjacent to areas of T2 hyperintensity. No new foci of enhancement. This represents a positive response to therapy. 2. Stable T2 hyperintensity adjacent to the left lateral ventricle. 3. No evidence for disease progression. 4. No acute intracranial abnormality or other significant interval change. Electronically Signed   By: San Morelle M.D.   On: 07/17/2020 07:24   Assessment/Plan 1. Anaplastic astrocytoma [facility])  Mr. Robert Watkins is clinically and radiographically stable today.  MRI demonstrates some degree of improvement with regards to previously seen foci of enhancement.  Ok to continue dosing Ivosidenib 500mg  daily.  We ask that Robert Watkins return to clinic in 3 months following next brain MRI given changes seen today, or sooner as needed.  We appreciate the opportunity to participate in the care of Robert Watkins.    All questions were answered. The patient knows to call the clinic with any problems, questions or concerns. No barriers to learning were detected.  I have spent a total of  30 minutes of face-to-face and non-face-to-face time, excluding clinical staff time, preparing to see patient, ordering tests and/or medications, counseling the patient, and independently interpreting results and communicating results to the patient/family/caregiver    Ventura Sellers, MD Medical Director of Neuro-Oncology [facility] [place] 07/20/20 10:56 AM

## 2020-07-26 ENCOUNTER — Other Ambulatory Visit: Payer: Self-pay | Admitting: Radiation Therapy

## 2020-08-04 ENCOUNTER — Other Ambulatory Visit: Payer: Self-pay | Admitting: Internal Medicine

## 2020-08-05 NOTE — Telephone Encounter (Signed)
Refill Request.  Not sure what the frequency and duration of this drug is.  Please advise.

## 2020-08-30 ENCOUNTER — Other Ambulatory Visit: Payer: Self-pay | Admitting: Internal Medicine

## 2020-08-30 NOTE — Telephone Encounter (Signed)
Rx request 

## 2020-09-24 ENCOUNTER — Other Ambulatory Visit: Payer: Self-pay | Admitting: Internal Medicine

## 2020-09-28 ENCOUNTER — Other Ambulatory Visit (HOSPITAL_COMMUNITY): Payer: Self-pay

## 2020-09-28 ENCOUNTER — Telehealth: Payer: Self-pay

## 2020-09-28 ENCOUNTER — Encounter: Payer: Self-pay | Admitting: Internal Medicine

## 2020-09-28 NOTE — Telephone Encounter (Signed)
Oral Oncology Patient Advocate Encounter  Received notification from [employer] that prior authorization for Tibsovo is required.  PA submitted on CoverMyMeds Key [id number] Status is pending  Oral Oncology Clinic will continue to follow.  [place] Patient [place] Phone [phone] Fax [phone] 09/28/2020 6:38 PM

## 2020-09-29 NOTE — Telephone Encounter (Signed)
Oral Oncology Pharmacist Encounter   Prior Authorization for Tibsovo has been approved.     Case# [id number] Effective dates: 09/29/2020 through 09/29/2021   Leron Croak, PharmD, BCPS Hematology/Oncology Clinical Pharmacist [facility] [phone] 09/29/2020 10:07 AM

## 2020-09-30 ENCOUNTER — Other Ambulatory Visit: Payer: Self-pay | Admitting: Radiation Therapy

## 2020-10-04 ENCOUNTER — Other Ambulatory Visit (HOSPITAL_COMMUNITY): Payer: Self-pay

## 2020-10-04 ENCOUNTER — Telehealth: Payer: Self-pay | Admitting: Pharmacist

## 2020-10-04 DIAGNOSIS — C719 Malignant neoplasm of brain, unspecified: Secondary | ICD-10-CM

## 2020-10-04 MED ORDER — TIBSOVO 250 MG PO TABS: 2.0000 | ORAL_TABLET | Freq: Every day | ORAL | 0 refills | Status: DC

## 2020-10-04 NOTE — Telephone Encounter (Signed)
Oral Oncology Pharmacist Encounter  Now that patient's insurance covers Tibsovo, he is unable to fill through mfg assistance program. Copay card obtained for patient to help reduce patient's out of pocket cost.   Prescription is now being filled through [facility] [employer]. Prescription redirected for dispensing. Copay card information shared with dispensing pharmacy.   Copay card information: BIN: [id number] Group: [id number] ID#: [id number]  Left voicemail for patient to call back to update him regarding change in pharmacy.   Leron Croak, PharmD, BCPS Hematology/Oncology Clinical Pharmacist [facility] [phone] 10/04/2020 12:35 PM

## 2020-10-08 ENCOUNTER — Encounter: Payer: Self-pay | Admitting: Internal Medicine

## 2020-10-14 ENCOUNTER — Telehealth: Payer: Self-pay

## 2020-10-14 ENCOUNTER — Encounter: Payer: Self-pay | Admitting: Internal Medicine

## 2020-10-14 NOTE — Telephone Encounter (Signed)
Pt LM requesting a refill of Keppra and indicated he has new insurance, so he "isn't sure where is rx will come from".   I have called the pt back and LM requesting he confirm where he would like his rx sent as he has multiple pharmacies listed. I also attempted to call his mother but her number is out of service.

## 2020-10-15 ENCOUNTER — Other Ambulatory Visit: Payer: Self-pay

## 2020-10-15 ENCOUNTER — Ambulatory Visit
Admission: RE | Admit: 2020-10-15 | Discharge: 2020-10-15 | Disposition: A | Discharge status: Home / Self Care | Payer: Managed Care, Other (non HMO) | Source: Ambulatory Visit | Attending: Internal Medicine | Admitting: Internal Medicine

## 2020-10-15 ENCOUNTER — Encounter: Payer: Self-pay | Admitting: Internal Medicine

## 2020-10-15 DIAGNOSIS — G40909 Epilepsy, unspecified, not intractable, without status epilepticus: Secondary | ICD-10-CM

## 2020-10-15 DIAGNOSIS — C719 Malignant neoplasm of brain, unspecified: Secondary | ICD-10-CM

## 2020-10-15 MED ORDER — GADOBENATE DIMEGLUMINE 529 MG/ML IV SOLN
18.0000 mL | Freq: Once | INTRAVENOUS | Status: AC | PRN
  Administered 2020-10-15: 18 mL via INTRAVENOUS

## 2020-10-15 MED ORDER — LEVETIRACETAM 500 MG PO TABS: 500.0000 mg | ORAL_TABLET | Freq: Two times a day (BID) | ORAL | 3 refills | Status: DC

## 2020-10-19 ENCOUNTER — Other Ambulatory Visit: Payer: Self-pay | Admitting: Internal Medicine

## 2020-10-19 DIAGNOSIS — C719 Malignant neoplasm of brain, unspecified: Secondary | ICD-10-CM

## 2020-10-21 ENCOUNTER — Inpatient Hospital Stay: Payer: Managed Care, Other (non HMO) | Attending: Internal Medicine

## 2020-10-21 ENCOUNTER — Inpatient Hospital Stay: Payer: Managed Care, Other (non HMO) | Admitting: Internal Medicine

## 2020-10-21 ENCOUNTER — Other Ambulatory Visit: Payer: Self-pay

## 2020-10-21 VITALS — BP 137/74 | HR 56 | Temp 98.0°F | Resp 18 | Ht 71.0 in | Wt 208.9 lb

## 2020-10-21 DIAGNOSIS — G40909 Epilepsy, unspecified, not intractable, without status epilepticus: Secondary | ICD-10-CM | POA: Insufficient documentation

## 2020-10-21 DIAGNOSIS — C714 Malignant neoplasm of occipital lobe: Secondary | ICD-10-CM | POA: Diagnosis present

## 2020-10-21 DIAGNOSIS — Z9221 Personal history of antineoplastic chemotherapy: Secondary | ICD-10-CM | POA: Diagnosis not present

## 2020-10-21 DIAGNOSIS — R112 Nausea with vomiting, unspecified: Secondary | ICD-10-CM | POA: Insufficient documentation

## 2020-10-21 DIAGNOSIS — Z79899 Other long term (current) drug therapy: Secondary | ICD-10-CM | POA: Diagnosis not present

## 2020-10-21 DIAGNOSIS — C719 Malignant neoplasm of brain, unspecified: Secondary | ICD-10-CM

## 2020-10-21 DIAGNOSIS — Z923 Personal history of irradiation: Secondary | ICD-10-CM | POA: Insufficient documentation

## 2020-10-21 LAB — CMP (CANCER CENTER ONLY)
ALT: 26 U/L (ref 0–44)
AST: 20 U/L (ref 15–41)
Albumin: 4.1 g/dL (ref 3.5–5.0)
Alkaline Phosphatase: 75 U/L (ref 38–126)
Anion gap: 7 (ref 5–15)
BUN: 10 mg/dL (ref 6–20)
CO2: 26 mmol/L (ref 22–32)
Calcium: 9.4 mg/dL (ref 8.9–10.3)
Chloride: 106 mmol/L (ref 98–111)
Creatinine: 1.1 mg/dL (ref 0.61–1.24)
GFR, Estimated: >60 mL/min (ref >60)
Glucose, Bld: 89 mg/dL (ref 70–99)
Potassium: 4.7 mmol/L (ref 3.5–5.1)
Sodium: 139 mmol/L (ref 135–145)
Total Bilirubin: 1 mg/dL (ref 0.3–1.2)
Total Protein: 6.9 g/dL (ref 6.5–8.1)

## 2020-10-21 LAB — CBC WITH DIFFERENTIAL (CANCER CENTER ONLY)
Abs Immature Granulocytes: 0.03 10*3/uL (ref 0.00–0.07)
Basophils Absolute: 0 10*3/uL (ref 0.0–0.1)
Basophils Relative: 0 %
Eosinophils Absolute: 0.1 10*3/uL (ref 0.0–0.5)
Eosinophils Relative: 2 %
HCT: 46.1 % (ref 39.0–52.0)
Hemoglobin: 16.9 g/dL (ref 13.0–17.0)
Immature Granulocytes: 1 %
Lymphocytes Relative: 23 %
Lymphs Abs: 1.1 10*3/uL (ref 0.7–4.0)
MCH: 33.2 pg (ref 26.0–34.0)
MCHC: 36.7 g/dL — ABNORMAL HIGH (ref 30.0–36.0)
MCV: 90.6 fL (ref 80.0–100.0)
Monocytes Absolute: 0.3 10*3/uL (ref 0.1–1.0)
Monocytes Relative: 7 %
Neutro Abs: 3.2 10*3/uL (ref 1.7–7.7)
Neutrophils Relative %: 67 %
Platelet Count: 160 10*3/uL (ref 150–400)
RBC: 5.09 MIL/uL (ref 4.22–5.81)
RDW: 11.4 % — ABNORMAL LOW (ref 11.5–15.5)
WBC Count: 4.7 10*3/uL (ref 4.0–10.5)
nRBC: 0 % (ref 0.0–0.2)

## 2020-10-21 NOTE — Progress Notes (Signed)
[place] [facility] [place], [place] [address] [id number]   Interval Evaluation  Date of Service: 10/21/20 Patient Name: Robert Watkins Patient MRN: [id number] Patient DOB: 08/06/1985 Provider: Ventura Sellers, MD  Identifying Statement:  Robert Watkins is a 35 y.o. 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 Kristeen Miss in [place]. 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 [name]. Pathology confirms anaplastic astrocytoma.   06/01/2010 Clinical Event-Other  New patient evaluation at [facility]. Recommend radiation therapy with concurrent Temozolomide followed by twelve cycles of five-day Temozolomide.   06/13/2010 - 08/01/2010 Radiation  Radiation with Temodar.  06/13/2010 - 08/01/2010 Chemotherapy  Temodar 75 mg/m2 with radiation.  08/16/2010 - 08/07/2011 Chemotherapy  5 day Temodar 200 mg/m2  02/19/2018 Progression  Progression of non-enhancing tumor compared to 2013. Recommend initiating metronomic daily Temodar 50 mg/m2/day.  Interval History:  Robert Watkins presents today for follow up after recent MRI brain.  He denies any new or progressive changes today.  Continues to describe modest fatigue.  No headaches or seizures.  He remains active and functionally independent.  Doing very well with his new job.  Medications: Current Outpatient Medications on File Prior to Visit  Medication Sig Dispense Refill   amLODipine (NORVASC) 2.5 MG tablet Take 1 tablet (2.5 mg total) by mouth daily. (Patient not taking: Reported on 06/18/2018) 30 tablet 0   Ivosidenib (TIBSOVO) 250 MG TABS Take 2 tablets by mouth daily. 60 tablet 0    levETIRAcetam (KEPPRA) 500 MG tablet Take 1 tablet (500 mg total) by mouth 2 (two) times daily. 60 tablet 3   ondansetron (ZOFRAN) 8 MG tablet Take 1 tablet (8 mg total) by mouth 2 (two) times daily as needed. Start on the third day after chemotherapy. (Patient not taking: Reported on 06/18/2018) 30 tablet 1   No current facility-administered medications on file prior to visit.    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 Recommend radiation therapy with concurrent Temozolomide followed by twelve cycles of five-day Temozolomide. ?   Seizure disorder, secondary ([place]) 04/07/2011   Past Surgical History:  Past Surgical History:  Procedure Laterality Date   CRANIECTOMY / CRANIOTOMY FOR EXCISION OF BRAIN TUMOR Left 05/26/2010   Left parietooccipital craniotomy with tumor resection by Dr. [name]. Pathology confirms anaplastic astrocytoma. ?   CRANIOTOMY FOR STEREOTACTIC GUIDED SURGERY Left 05/12/2010   Stereotactic biopsy of left occipital lesion by Dr. Kristeen Miss in [place]. Pathology demonstrates anaplastic astrocytoma (WHO III)?   Social History:  Social History   Socioeconomic History   Marital status: Single    Spouse name: Not on file   Number of children: Not on file   Years of education: Not on file   Highest education level: Not on file  Occupational History   Not  on file  Tobacco Use   Smoking status: Former    Pack years: 0.00   Smokeless tobacco: Never  Substance and Sexual Activity   Alcohol use: Yes    Alcohol/week: 4.0 standard  drinks    Types: 4 Shots of liquor per week   Drug use: Yes    Types: Marijuana   Sexual activity: Yes  Other Topics Concern   Not on file  Social History Narrative   Not on file   Social Determinants of Health   Financial Resource Strain: Not on file  Food Insecurity: Not on file  Transportation Needs: Not on file  Physical Activity: Not on file  Stress: Not on file  Social Connections: Not on file  Intimate Partner Violence: Not on file   Family History: No family history on file.  Review of Systems: Constitutional: Denies fevers, chills or abnormal weight loss Eyes: Denies blurriness of vision Ears, nose, mouth, throat, and face: Denies mucositis or sore throat Respiratory: Denies cough, dyspnea or wheezes Cardiovascular: Denies palpitation, chest discomfort or lower extremity swelling Gastrointestinal:  Denies nausea, constipation, diarrhea GU: Denies dysuria or incontinence Skin: Denies abnormal skin rashes Neurological: Per HPI Musculoskeletal: Denies joint pain, back or neck discomfort. No decrease in ROM Behavioral/Psych: Denies anxiety, disturbance in thought content, and mood instability  Physical Exam: Vitals:   10/21/20 1010  BP: 137/74  Pulse: (!) 56  Resp: 18  Temp: 98 F (36.7 C)  SpO2: 100%   KPS: 100. General: Alert, cooperative, pleasant, in no acute distress Head: Normal EENT: No conjunctival injection or scleral icterus. Oral mucosa moist Lungs: Resp effort normal Cardiac: Regular rate and rhythm Abdomen: Soft, non-distended abdomen Skin: No rashes cyanosis or petechiae. Extremities: No clubbing or edema  Neurologic Exam: Mental Status: Awake, alert, attentive to examiner. Oriented to self and environment. Language is fluent with intact comprehension.  Cranial Nerves: Visual acuity is grossly normal. Visual fields are full. Extra-ocular movements intact. No ptosis. Face is symmetric, tongue midline. Motor: Tone and bulk are normal. Power  is full in both arms and legs. Reflexes are symmetric, no pathologic reflexes present. Intact finger to nose bilaterally Sensory: Intact to light touch and temperature Gait: Normal and tandem gait is normal.   Labs: I have reviewed the data as listed    Component Value Date/Time   NA 139 07/20/2020 1031   K 4.5 07/20/2020 1031   CL 104 07/20/2020 1031   CO2 26 07/20/2020 1031   GLUCOSE 113 (H) 07/20/2020 1031   BUN 15 07/20/2020 1031   CREATININE 0.98 07/20/2020 1031   CALCIUM 9.1 07/20/2020 1031   PROT 7.0 07/20/2020 1031   ALBUMIN 4.3 07/20/2020 1031   AST 21 07/20/2020 1031   ALT 34 07/20/2020 1031   ALKPHOS 70 07/20/2020 1031   BILITOT 0.7 07/20/2020 1031   GFRNONAA >60 07/20/2020 1031   GFRAA >60 08/19/2018 0854   Lab Results  Component Value Date   WBC 4.7 10/21/2020   NEUTROABS 3.2 10/21/2020   HGB 16.9 10/21/2020   HCT 46.1 10/21/2020   MCV 90.6 10/21/2020   PLT 160 10/21/2020   Imaging:  [facility] Clinician Interpretation: I have personally reviewed the CNS images as listed.  My interpretation, in the context of the patient's clinical presentation, is progressive disease  MR BRAIN W WO CONTRAST  Result Date: 10/15/2020 CLINICAL DATA:  Anaplastic astrocytoma WHO grade 3. EXAM: MRI HEAD WITHOUT AND WITH CONTRAST TECHNIQUE: Multiplanar, multiecho pulse sequences of the brain and surrounding  structures were obtained without and with intravenous contrast. CONTRAST:  55mL MULTIHANCE GADOBENATE DIMEGLUMINE 529 MG/ML IV SOLN COMPARISON:  MRI head 07/16/2020 FINDINGS: Brain: Left parietal craniotomy for resection of tumor in the left occipital lobe. 24 x 16 mm area of T2 and FLAIR hyperintensity posterior to the occipital horn on the left is stable. This showed small areas of enhancement on the MRI of 07/16/2020. No residual enhancement in this area. Masslike area of swelling and T2 and FLAIR hyperintensity in the left medial occipital lobe shows slight progression since prior  studies. There has been progressive growth since earlier studies of August 08, 2019, 02/01/2020, and 07/16/2020. This area does not show any abnormal enhancement or restricted diffusion. Ventricle size normal.  No acute infarct. Vascular: Normal arterial flow voids. 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 Skull and upper cervical spine: Posterior craniotomy on the left. Sinuses/Orbits: Negative Other: None IMPRESSION: Postop tumor resection left occipital lobe. The region of T2 and FLAIR hyperintensity posterior to the left occipital lobe is stable. However, there is continued progression in size of the masslike area of T2 and FLAIR hyperintensity in the left medial occipital lobe. This area does not show abnormal enhancement and is concerning for tumor progression. Electronically Signed   By: Franchot Gallo M.D.   On: 10/15/2020 13:20    Assessment/Plan 1. Anaplastic astrocytoma [facility])  Robert Watkins is clinically stable today.  MRI demonstrates non enhancing progression with mass effect within anterior aspect of left occipital lobe, adjacent to hippocampus.  This change is best visualized when compared to FLAIR images from 02/01/20.  Change in dimensional volume appears at or above 25%.    Case was discussed with his primary neuro-oncologist Dr. Imagene Riches.   We are in agreement with recommendation to initiate therapy with CCNU for control of progressive component.  He is heavily pre-treated with Temodar and recently progressed through metronomic Temodar; he has not yet been exposed to CCNU.  Will continue dosing Ivosidenib 500mg  daily given good control of previously progressive posterior aspect of the mass, overall very slow progression of active disease.  CCNU will be administered orally at dose level 110mg /m2 every 6 weeks.  The patient will have a complete blood count and CMP performed on days 14, 28 and 42 of each cycle. Labs may need to be performed more often. Zofran will  prescribed for home use for nausea/vomiting.   Informed consent was obtained verbally at bedside to proceed with oral chemotherapy.  Chemotherapy should be held for the following:  ANC less than 1,000  Platelets less than 100,000  LFT or creatinine greater than 2x ULN  If clinical concerns/contraindications develop  We ask that Robert Watkins return to clinic in in 6 weeks prior to cycle #2 of CCNU.  In interim, we will evaluate lab results every two weeks.  Next MRI brain can be following 2 cycles of therapy.  We appreciate the opportunity to participate in the care of Robert Watkins.    All questions were answered. The patient knows to call the clinic with any problems, questions or concerns. No barriers to learning were detected.  I have spent a total of 40 minutes of face-to-face and non-face-to-face time, excluding clinical staff time, preparing to see patient, ordering tests and/or medications, counseling the patient, and independently interpreting results and communicating results to the patient/family/caregiver    Ventura Sellers, MD Medical Director of Neuro-Oncology [facility] [place] 10/21/20 10:04 AM

## 2020-10-28 ENCOUNTER — Other Ambulatory Visit (HOSPITAL_COMMUNITY): Payer: Self-pay

## 2020-10-28 ENCOUNTER — Other Ambulatory Visit: Payer: Self-pay | Admitting: Internal Medicine

## 2020-10-28 DIAGNOSIS — C719 Malignant neoplasm of brain, unspecified: Secondary | ICD-10-CM

## 2020-10-28 MED ORDER — LOMUSTINE 100 MG PO CAPS
100.0000 mg/m2 | ORAL_CAPSULE | Freq: Once | ORAL | 0 refills | Status: DC
  Filled 2020-10-28: qty 2, 1d supply, fill #0

## 2020-10-28 MED ORDER — ONDANSETRON HCL 8 MG PO TABS
8.0000 mg | ORAL_TABLET | Freq: Two times a day (BID) | ORAL | 1 refills | Status: DC | PRN
  Filled 2020-10-28: qty 21, 23d supply, fill #0

## 2020-10-28 NOTE — Progress Notes (Signed)
DISCONTINUE ON PATHWAY REGIMEN - Neuro     A cycle is every 28 days:     Temozolomide      Temozolomide   **Always confirm dose/schedule in your pharmacy ordering system**  REASON: Disease Progression PRIOR TREATMENT: [id number]: Temozolomide 150/200 mg/m2 PO D1-5 q28 Days TREATMENT RESPONSE: Progressive Disease (PD)  START ON PATHWAY REGIMEN - Neuro     A cycle is every 42 days:     Lomustine   **Always confirm dose/schedule in your pharmacy ordering system**  Patient Characteristics: Grade 3 or 4 Astrocytoma, IDH-mutant, Recurrent or Progressive, Nonsurgical Candidate, Systemic Therapy Candidate, BRAF V600E Mutation Negative/Unknown and NTRK Fusion Negative/Unknown Disease Classification: Glioma Disease Classification: Grade 3 or 4 Astrocytoma, IDH-mutant Disease Status: Recurrent or Progressive Treatment Classification: Nonsurgical Candidate Treatment (Nonsurgical/Adjuvant): Systemic Therapy Candidate NTRK Gene Fusion Status: Did Not Order Test BRAF V600E Mutation Status: Did Not Order Test Intent of Therapy: Non-Curative / Palliative Intent, Discussed with Patient

## 2020-10-29 ENCOUNTER — Telehealth: Payer: Self-pay | Admitting: Pharmacist

## 2020-10-29 ENCOUNTER — Other Ambulatory Visit (HOSPITAL_COMMUNITY): Payer: Self-pay

## 2020-10-29 DIAGNOSIS — C719 Malignant neoplasm of brain, unspecified: Secondary | ICD-10-CM

## 2020-10-29 MED ORDER — LOMUSTINE 100 MG PO CAPS: 100.0000 mg/m2 | ORAL_CAPSULE | Freq: Once | ORAL | 0 refills | Status: AC

## 2020-10-29 NOTE — Telephone Encounter (Signed)
Oral Oncology Pharmacist Encounter  Received new prescription for Gleostine (lomustine) for the treatment of progressive IDH1 mutated grade III anaplastic astrocytoma (patient will still continue treatment with ivosidenib as well) planned duration until disease progression or unacceptable drug toxicity.  Prescription dose and frequency assessed for appropriateness. Confirmed with Dr. Mickeal Skinner planned dosing for lomustine is 100 mg/m2.   CMP and CBC w/ Diff from 10/21/20 assessed, no baseline dose adjustments required.  Current medication list in Epic reviewed, no relevant/significant DDIs with Gleostine identified.  Evaluated chart and no patient barriers to medication adherence noted.   Patient agreement for treatment documented in MD note on 10/21/20.  [name] insurance requires that Gleostine be filled through [facility] - prescription redirected for dispensing. Will follow-up regarding any copayment issues once pharmacy has been able to process prescription.   Oral Oncology Clinic will continue to follow for insurance authorization, initial counseling and start date.  Leron Croak, PharmD, BCPS Hematology/Oncology Clinical Pharmacist [facility] [phone] 10/29/2020 8:48 AM

## 2020-11-01 ENCOUNTER — Other Ambulatory Visit (HOSPITAL_COMMUNITY): Payer: Self-pay

## 2020-11-01 NOTE — Telephone Encounter (Signed)
Oral Chemotherapy Pharmacist Encounter   Called [facility] to check on status of Gleostine (lomustine) for patient. Per representative Rx is currently being processed. Provided pharmacy with patient's updated prescription insurance information and request made that process be expedited for patient.     Oral Oncology Clinic will continue to follow.  Leron Croak, PharmD, BCPS Hematology/Oncology Clinical Pharmacist [facility] [phone] 11/01/2020 1:36 PM

## 2020-11-02 ENCOUNTER — Other Ambulatory Visit: Payer: Self-pay | Admitting: Internal Medicine

## 2020-11-02 ENCOUNTER — Telehealth: Payer: Self-pay | Admitting: Internal Medicine

## 2020-11-02 NOTE — Telephone Encounter (Signed)
Scheduled appointment per 07/12 sch msg. Left message 

## 2020-11-03 ENCOUNTER — Telehealth: Payer: Self-pay | Admitting: *Deleted

## 2020-11-03 NOTE — Telephone Encounter (Signed)
Oral Chemotherapy Pharmacist Encounter   Followed up with [facility] this AM regarding status of Gleostine (lomustine) for patient. Rx is still undergoing benefits investigation per representative, thus unable to provide any information regarding copay at this time.   Oral Oncology Clinic will continue to follow.  Leron Croak, PharmD, BCPS Hematology/Oncology Clinical Pharmacist [facility] [phone] 11/03/2020 9:05 AM

## 2020-11-03 NOTE — Telephone Encounter (Signed)
Received vm message from patient regarding starting his Gleostine (lomustine)  He is asking if he needs to do labs on Thursday, 11/04/20. He has not started the Lomustine yet.  Please advise

## 2020-11-04 ENCOUNTER — Other Ambulatory Visit: Payer: Self-pay

## 2020-11-04 ENCOUNTER — Inpatient Hospital Stay: Payer: Managed Care, Other (non HMO) | Attending: Internal Medicine

## 2020-11-04 DIAGNOSIS — C714 Malignant neoplasm of occipital lobe: Secondary | ICD-10-CM | POA: Diagnosis present

## 2020-11-04 DIAGNOSIS — C719 Malignant neoplasm of brain, unspecified: Secondary | ICD-10-CM

## 2020-11-04 LAB — CBC WITH DIFFERENTIAL (CANCER CENTER ONLY)
Abs Immature Granulocytes: 0.03 10*3/uL (ref 0.00–0.07)
Basophils Absolute: 0 10*3/uL (ref 0.0–0.1)
Basophils Relative: 1 %
Eosinophils Absolute: 0.1 10*3/uL (ref 0.0–0.5)
Eosinophils Relative: 2 %
HCT: 46.4 % (ref 39.0–52.0)
Hemoglobin: 17.3 g/dL — ABNORMAL HIGH (ref 13.0–17.0)
Immature Granulocytes: 1 %
Lymphocytes Relative: 23 %
Lymphs Abs: 1.1 10*3/uL (ref 0.7–4.0)
MCH: 33.9 pg (ref 26.0–34.0)
MCHC: 37.3 g/dL — ABNORMAL HIGH (ref 30.0–36.0)
MCV: 91 fL (ref 80.0–100.0)
Monocytes Absolute: 0.3 10*3/uL (ref 0.1–1.0)
Monocytes Relative: 6 %
Neutro Abs: 3.4 10*3/uL (ref 1.7–7.7)
Neutrophils Relative %: 67 %
Platelet Count: 149 10*3/uL — ABNORMAL LOW (ref 150–400)
RBC: 5.1 MIL/uL (ref 4.22–5.81)
RDW: 11.5 % (ref 11.5–15.5)
WBC Count: 5 10*3/uL (ref 4.0–10.5)
nRBC: 0 % (ref 0.0–0.2)

## 2020-11-04 LAB — CMP (CANCER CENTER ONLY)
ALT: 29 U/L (ref 0–44)
AST: 23 U/L (ref 15–41)
Albumin: 4.1 g/dL (ref 3.5–5.0)
Alkaline Phosphatase: 73 U/L (ref 38–126)
Anion gap: 9 (ref 5–15)
BUN: 18 mg/dL (ref 6–20)
CO2: 27 mmol/L (ref 22–32)
Calcium: 9.5 mg/dL (ref 8.9–10.3)
Chloride: 105 mmol/L (ref 98–111)
Creatinine: 1.2 mg/dL (ref 0.61–1.24)
GFR, Estimated: >60 mL/min (ref >60)
Glucose, Bld: 82 mg/dL (ref 70–99)
Potassium: 4.6 mmol/L (ref 3.5–5.1)
Sodium: 141 mmol/L (ref 135–145)
Total Bilirubin: 1 mg/dL (ref 0.3–1.2)
Total Protein: 7.1 g/dL (ref 6.5–8.1)

## 2020-11-04 NOTE — Telephone Encounter (Signed)
Oral Chemotherapy Pharmacist Encounter   Attempted to reach patient to provide update and offer for initial counseling on oral medication: Gleostine (lomustine).   No answer. Left voicemail for patient to call back to discuss details of medication acquisition and initial counseling session.  Confirmed this AM with [facility] that prescription is ready to be set up for shipment for patient and can be overnighted to him for delivery. Patient has $0 copay for first fill of lomustine.   Leron Croak, PharmD, BCPS Hematology/Oncology Clinical Pharmacist [facility] [phone] 11/04/2020 10:28 AM

## 2020-11-04 NOTE — Telephone Encounter (Signed)
Oral Chemotherapy Pharmacist Encounter   Spoke with patient today to follow up regarding patient's oral chemotherapy medication: Gleostine (lomustine)  Patient provided with phone number to set up shipment of lomustine through [facility] (tele: [phone]) and informed of $0 copay for first fill of lomustine. Pharmacy stated they should be able to overnight ship medication to patient for delivery on 11/05/20. Patient will call back once shipment is set up to discuss details of medication.   Leron Croak, PharmD, BCPS Hematology/Oncology Clinical Pharmacist [facility] [phone] 11/04/2020 11:21 AM

## 2020-11-05 NOTE — Telephone Encounter (Signed)
Oral Chemotherapy Pharmacist Encounter  I spoke with patient for overview of: lomustine for the treatment of progressive IDH1 mutated grade III anaplastic astrocytoma (patient will still continue treatment with ivosidenib as well), planned duration until disease progression or unacceptable toxicity.  Counseled patient on administration, dosing, side effects, monitoring, drug-food interactions, safe handling, storage, and disposal.  Patient will take lomustine $RemoveBefore'100mg'qeIBnplFbmrwc$  capsules, 2 capsules ($RemoveBef'200mg'tKtQyhcOaa$ ) by mouth once for 1 dose, may take at bedtime and on an empty stomach to decrease nausea and vomiting.  Lomustine will be administered once every 6 weeks.  Lomustine start date: 11/05/20 PM   Patient will take Zofran $RemoveBef'8mg'hLwpiCCsRg$  tablet, 1 tablet by mouth 30-60 min prior to lomustine dose to help decrease N/V.   Adverse effects include but are not limited to: nausea, vomiting, stomatitis, alopecia, decreased blood counts, hepatotoxicity, and pulmonary toxicity.  Reviewed with patient importance of keeping a medication schedule and plan for any missed doses. No barriers to medication adherence identified.  Medication reconciliation performed and medication/allergy list updated.  Insurance authorization for lomustine has been obtained. Test claim at the pharmacy revealed copayment $0 for 1st fill of Lomustine. This will be delivered to patient's home 11/05/20 from [place] questions answered.  Mr. Robert Watkins voiced understanding and appreciation.   Medication education handout placed in mail for patient. Patient knows to call the office with questions or concerns. Oral Chemotherapy Clinic phone number provided to patient.   Leron Croak, PharmD, BCPS Hematology/Oncology Clinical Pharmacist [facility] [phone] 11/05/2020 11:39 AM

## 2020-11-10 ENCOUNTER — Telehealth: Payer: Self-pay | Admitting: *Deleted

## 2020-11-10 NOTE — Telephone Encounter (Signed)
Received call from patient. He states he started his oral lomustine last Friday,11/05/20.  He started having a scratchy throat, body aches, headaches since the later part of the weekend. Generally feels unwell. Denies fevers, chills He asked what he could take-advised he could try either tylenol or ibuprofen.  If neither are helpful, advised to call back, especially if symptoms worsen. Robert Watkins voiced understanding.

## 2020-11-10 NOTE — Telephone Encounter (Signed)
Pt states he is covid negative as of yesterday, 11/09/20

## 2020-11-11 ENCOUNTER — Telehealth: Payer: Self-pay | Admitting: *Deleted

## 2020-11-11 NOTE — Telephone Encounter (Signed)
Patient called to report that he has tested positive last night for COVID 2 days after symptoms started.    He took his Lomustine dose 07/15 and is currently on Tibsovo BID.  Patient has every 2 week lab monitoring's that will need to be adjusted due to COVID protocol patients.    Per Dr Mickeal Skinner no need to take any antiviral/immune covid treatments as currently he shouldn't be immune compromised yet as medication can take a couple of weeks to drop counts.   Routed to Dr Mickeal Skinner to please advise if ok to move lab only out to 11/22/2020 due to covid protocol and leave rest of appts as is.

## 2020-11-17 ENCOUNTER — Telehealth: Payer: Self-pay | Admitting: *Deleted

## 2020-11-17 NOTE — Telephone Encounter (Signed)
Received call from pt asking about lab appt for this week.  He tested positive for Covid on 11/10/20. He is due for labs on Thursday, 11/18/20. Advised that he would need to wait until 11/22/20. Pt states he is going on a much needed vacation on 11/20/20 and will be gone all next week. He is asking if he can wait until the following week for labs, do them at the end of the day on 7/29/2 or perhaps give him the lab requisitions and he could have them done @ the [facility] [place] next week.  Please advise  Pt will need call back on 11/18/20

## 2020-11-18 ENCOUNTER — Other Ambulatory Visit: Payer: Managed Care, Other (non HMO)

## 2020-12-02 ENCOUNTER — Telehealth: Payer: Self-pay | Admitting: *Deleted

## 2020-12-02 ENCOUNTER — Ambulatory Visit: Payer: Managed Care, Other (non HMO) | Admitting: Internal Medicine

## 2020-12-02 ENCOUNTER — Inpatient Hospital Stay: Payer: Managed Care, Other (non HMO) | Attending: Internal Medicine

## 2020-12-02 ENCOUNTER — Other Ambulatory Visit: Payer: Self-pay

## 2020-12-02 DIAGNOSIS — G40909 Epilepsy, unspecified, not intractable, without status epilepticus: Secondary | ICD-10-CM | POA: Diagnosis not present

## 2020-12-02 DIAGNOSIS — C714 Malignant neoplasm of occipital lobe: Secondary | ICD-10-CM | POA: Diagnosis present

## 2020-12-02 DIAGNOSIS — Z79899 Other long term (current) drug therapy: Secondary | ICD-10-CM | POA: Insufficient documentation

## 2020-12-02 DIAGNOSIS — Z923 Personal history of irradiation: Secondary | ICD-10-CM | POA: Diagnosis not present

## 2020-12-02 DIAGNOSIS — R5383 Other fatigue: Secondary | ICD-10-CM | POA: Insufficient documentation

## 2020-12-02 DIAGNOSIS — Z9221 Personal history of antineoplastic chemotherapy: Secondary | ICD-10-CM | POA: Insufficient documentation

## 2020-12-02 DIAGNOSIS — R112 Nausea with vomiting, unspecified: Secondary | ICD-10-CM | POA: Diagnosis not present

## 2020-12-02 DIAGNOSIS — C719 Malignant neoplasm of brain, unspecified: Secondary | ICD-10-CM

## 2020-12-02 LAB — CBC WITH DIFFERENTIAL (CANCER CENTER ONLY)
Abs Immature Granulocytes: 0.05 10*3/uL (ref 0.00–0.07)
Basophils Absolute: 0 10*3/uL (ref 0.0–0.1)
Basophils Relative: 1 %
Eosinophils Absolute: 0.1 10*3/uL (ref 0.0–0.5)
Eosinophils Relative: 2 %
Hemoglobin: 16.7 g/dL (ref 13.0–17.0)
Immature Granulocytes: 1 %
Lymphocytes Relative: 21 %
Lymphs Abs: 1.1 10*3/uL (ref 0.7–4.0)
Monocytes Absolute: 0.3 10*3/uL (ref 0.1–1.0)
Monocytes Relative: 6 %
Neutro Abs: 3.6 10*3/uL (ref 1.7–7.7)
Neutrophils Relative %: 69 %
Platelet Count: 107 10*3/uL — ABNORMAL LOW (ref 150–400)
WBC Count: 5.1 10*3/uL (ref 4.0–10.5)
nRBC: 0 % (ref 0.0–0.2)

## 2020-12-02 LAB — CMP (CANCER CENTER ONLY)
ALT: 40 U/L (ref 0–44)
AST: 24 U/L (ref 15–41)
Albumin: 4 g/dL (ref 3.5–5.0)
Alkaline Phosphatase: 77 U/L (ref 38–126)
Anion gap: 7 (ref 5–15)
BUN: 16 mg/dL (ref 6–20)
CO2: 25 mmol/L (ref 22–32)
Calcium: 9.2 mg/dL (ref 8.9–10.3)
Chloride: 105 mmol/L (ref 98–111)
Creatinine: 1.01 mg/dL (ref 0.61–1.24)
GFR, Estimated: >60 mL/min (ref >60)
Glucose, Bld: 119 mg/dL — ABNORMAL HIGH (ref 70–99)
Potassium: 4.3 mmol/L (ref 3.5–5.1)
Sodium: 137 mmol/L (ref 135–145)
Total Bilirubin: 1 mg/dL (ref 0.3–1.2)
Total Protein: 6.7 g/dL (ref 6.5–8.1)

## 2020-12-02 NOTE — Telephone Encounter (Signed)
Patient had labs done today.    He also only has 2 tablets of Tibsovo left and needs refill.  Please review labs and refill to [facility].  Secondly, Patient had COVID 3 weeks ago and wants to know when he should have his booster vaccine done.  Please advise.

## 2020-12-03 ENCOUNTER — Other Ambulatory Visit: Payer: Self-pay | Admitting: Internal Medicine

## 2020-12-03 DIAGNOSIS — C719 Malignant neoplasm of brain, unspecified: Secondary | ICD-10-CM

## 2020-12-03 MED ORDER — TIBSOVO 250 MG PO TABS: 2.0000 | ORAL_TABLET | Freq: Every day | ORAL | 0 refills | Status: DC

## 2020-12-06 ENCOUNTER — Other Ambulatory Visit: Payer: Self-pay | Admitting: Internal Medicine

## 2020-12-08 ENCOUNTER — Other Ambulatory Visit: Payer: Self-pay

## 2020-12-08 ENCOUNTER — Encounter: Payer: Self-pay | Admitting: Internal Medicine

## 2020-12-09 ENCOUNTER — Inpatient Hospital Stay (HOSPITAL_BASED_OUTPATIENT_CLINIC_OR_DEPARTMENT_OTHER): Payer: Managed Care, Other (non HMO) | Admitting: Internal Medicine

## 2020-12-09 ENCOUNTER — Other Ambulatory Visit: Payer: Self-pay

## 2020-12-09 ENCOUNTER — Inpatient Hospital Stay: Payer: Managed Care, Other (non HMO)

## 2020-12-09 VITALS — BP 145/96 | HR 64 | Temp 98.4°F | Resp 18 | Ht 71.0 in | Wt 209.6 lb

## 2020-12-09 DIAGNOSIS — C719 Malignant neoplasm of brain, unspecified: Secondary | ICD-10-CM

## 2020-12-09 DIAGNOSIS — C714 Malignant neoplasm of occipital lobe: Secondary | ICD-10-CM | POA: Diagnosis not present

## 2020-12-09 DIAGNOSIS — G40909 Epilepsy, unspecified, not intractable, without status epilepticus: Secondary | ICD-10-CM

## 2020-12-09 LAB — CBC WITH DIFFERENTIAL (CANCER CENTER ONLY)
Abs Immature Granulocytes: 0.03 10*3/uL (ref 0.00–0.07)
Basophils Absolute: 0 10*3/uL (ref 0.0–0.1)
Basophils Relative: 0 %
Eosinophils Absolute: 0.1 10*3/uL (ref 0.0–0.5)
Eosinophils Relative: 1 %
HCT: 44.9 % (ref 39.0–52.0)
Hemoglobin: 16.8 g/dL (ref 13.0–17.0)
Immature Granulocytes: 1 %
Lymphocytes Relative: 16 %
Lymphs Abs: 1 10*3/uL (ref 0.7–4.0)
MCH: 33.8 pg (ref 26.0–34.0)
MCHC: 37.4 g/dL — ABNORMAL HIGH (ref 30.0–36.0)
MCV: 90.3 fL (ref 80.0–100.0)
Monocytes Absolute: 0.4 10*3/uL (ref 0.1–1.0)
Monocytes Relative: 6 %
Neutro Abs: 4.6 10*3/uL (ref 1.7–7.7)
Neutrophils Relative %: 76 %
Platelet Count: 120 10*3/uL — ABNORMAL LOW (ref 150–400)
RBC: 4.97 MIL/uL (ref 4.22–5.81)
RDW: 13.3 % (ref 11.5–15.5)
WBC Count: 6 10*3/uL (ref 4.0–10.5)
nRBC: 0 % (ref 0.0–0.2)

## 2020-12-09 LAB — CMP (CANCER CENTER ONLY)
ALT: 35 U/L (ref 0–44)
AST: 34 U/L (ref 15–41)
Albumin: 4.2 g/dL (ref 3.5–5.0)
Alkaline Phosphatase: 79 U/L (ref 38–126)
Anion gap: 8 (ref 5–15)
BUN: 12 mg/dL (ref 6–20)
CO2: 26 mmol/L (ref 22–32)
Calcium: 9.4 mg/dL (ref 8.9–10.3)
Chloride: 105 mmol/L (ref 98–111)
Creatinine: 1.11 mg/dL (ref 0.61–1.24)
GFR, Estimated: >60 mL/min (ref >60)
Glucose, Bld: 102 mg/dL — ABNORMAL HIGH (ref 70–99)
Potassium: 4.1 mmol/L (ref 3.5–5.1)
Sodium: 139 mmol/L (ref 135–145)
Total Bilirubin: 0.8 mg/dL (ref 0.3–1.2)
Total Protein: 6.9 g/dL (ref 6.5–8.1)

## 2020-12-09 NOTE — Progress Notes (Signed)
[facility] [place], [place] [address] [phone]   Interval Evaluation  Date of Service: 12/09/20 Patient Name: Robert Watkins Patient MRN: [id number] Patient DOB: April 06, 1986 Provider: Ventura Sellers, MD  Identifying Statement:  Robert Watkins is a 35 y.o. 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 Kristeen Miss in [place]. 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 [name]. Pathology confirms anaplastic astrocytoma.   06/01/2010 Clinical Event-Other  New patient evaluation at [facility] [place] [facility]. Recommend radiation therapy with concurrent Temozolomide followed by twelve cycles of five-day Temozolomide.   06/13/2010 - 08/01/2010 Radiation  Radiation with Temodar.  06/13/2010 - 08/01/2010 Chemotherapy  Temodar 75 mg/m2 with radiation.  08/16/2010 - 08/07/2011 Chemotherapy  5 day Temodar 200 mg/m2  02/19/2018 Progression  Progression of non-enhancing tumor compared to 2013. Recommend initiating metronomic daily Temodar 50 mg/m2/day.  Interval History:  Robert Watkins presents today for follow up after completing first cycle of oral CCNU with Tibsovo.  Overall he has tolerated therapy well, aside from bout of COVID illness one month ago (brief, self limited).  He denies any new or progressive changes today.  Continues to describe modest fatigue.  No headaches or seizures.  He remains active and functionally independent. Continues to work full time.  Medications: Current Outpatient Medications on File Prior to Visit  Medication Sig Dispense Refill   amLODipine (NORVASC) 2.5 MG tablet Take 1 tablet (2.5 mg total) by mouth  daily. (Patient not taking: No sig reported) 30 tablet 0   GLEOSTINE 100 MG capsule TAKE TWO CAPS BY MOUTH ONCE FOR 1 DOSE EVERY 6 WEEKS. TAKE ON AN EMPTY STOMACH 1 HR BEFORE OR 2 HOURS AFTER A MEAL. 2 capsule 0   GLEOSTINE 100 MG capsule Take by mouth.     Ivosidenib (TIBSOVO) 250 MG TABS Take 2 tablets by mouth daily. 60 tablet 0   levETIRAcetam (KEPPRA) 500 MG tablet Take 1 tablet (500 mg total) by mouth 2 (two) times daily. 60 tablet 3   ondansetron (ZOFRAN) 8 MG tablet Take 1 tablet by mouth 2 times daily as needed. Start on the third day after chemotherapy. 30 tablet 1   No current facility-administered medications on file prior to visit.    Allergies:  Allergies  Allergen Reactions   Lamotrigine Rash   Past Medical History:  Past Medical History:  Diagnosis Date   Cancer of brain ([place]) 04/07/2011   Depression, reactive 04/07/2011   History of chemotherapy 06/13/2010-08/01/2010   42 day Temodar with concurrent Radiation   History of chemotherapy 08/16/2010-08/07/2011   5 day Temodar 200 mg/m2   History of radiation therapy 06/13/2010-08/01/2010   [facility] [place] [facility]. Recommend radiation therapy with concurrent Temozolomide followed by twelve cycles of five-day Temozolomide. ?   Seizure disorder, secondary ([place]) 04/07/2011   Past Surgical History:  Past Surgical History:  Procedure Laterality Date   CRANIECTOMY / CRANIOTOMY FOR EXCISION OF BRAIN TUMOR Left 05/26/2010   Left parietooccipital craniotomy with tumor resection by Dr. [name]. Pathology confirms anaplastic astrocytoma. ?   CRANIOTOMY FOR STEREOTACTIC GUIDED SURGERY Left 05/12/2010   Stereotactic biopsy of left occipital lesion by Dr. Kristeen Miss in [place]. Pathology demonstrates anaplastic  astrocytoma (WHO III)?   Social History:  Social History   Socioeconomic History   Marital status: Single    Spouse name: Not on file   Number of children: Not on file   Years of  education: Not on file   Highest education level: Not on file  Occupational History   Not on file  Tobacco Use   Smoking status: Former   Smokeless tobacco: Never  Substance and Sexual Activity   Alcohol use: Yes    Alcohol/week: 4.0 standard drinks    Types: 4 Shots of liquor per week   Drug use: Yes    Types: Marijuana   Sexual activity: Yes  Other Topics Concern   Not on file  Social History Narrative   Not on file   Social Determinants of Health   Financial Resource Strain: Not on file  Food Insecurity: Not on file  Transportation Needs: Not on file  Physical Activity: Not on file  Stress: Not on file  Social Connections: Not on file  Intimate Partner Violence: Not on file   Family History: No family history on file.  Review of Systems: Constitutional: Denies fevers, chills or abnormal weight loss Eyes: Denies blurriness of vision Ears, nose, mouth, throat, and face: Denies mucositis or sore throat Respiratory: Denies cough, dyspnea or wheezes Cardiovascular: Denies palpitation, chest discomfort or lower extremity swelling Gastrointestinal:  Denies nausea, constipation, diarrhea GU: Denies dysuria or incontinence Skin: Denies abnormal skin rashes Neurological: Per HPI Musculoskeletal: Denies joint pain, back or neck discomfort. No decrease in ROM Behavioral/Psych: Denies anxiety, disturbance in thought content, and mood instability  Physical Exam: Vitals:   12/09/20 0844  BP: (!) 145/96  Pulse: 64  Resp: 18  Temp: 98.4 F (36.9 C)  SpO2: 100%   KPS: 100. General: Alert, cooperative, pleasant, in no acute distress Head: Normal EENT: No conjunctival injection or scleral icterus. Oral mucosa moist Lungs: Resp effort normal Cardiac: Regular rate and rhythm Abdomen: Soft, non-distended abdomen Skin: No rashes cyanosis or petechiae. Extremities: No clubbing or edema  Neurologic Exam: Mental Status: Awake, alert, attentive to examiner. Oriented to self  and environment. Language is fluent with intact comprehension.  Cranial Nerves: Visual acuity is grossly normal. Visual fields are full. Extra-ocular movements intact. No ptosis. Face is symmetric, tongue midline. Motor: Tone and bulk are normal. Power is full in both arms and legs. Reflexes are symmetric, no pathologic reflexes present. Intact finger to nose bilaterally Sensory: Intact to light touch and temperature Gait: Normal and tandem gait is normal.   Labs: I have reviewed the data as listed    Component Value Date/Time   NA 139 12/09/2020 0833   K 4.1 12/09/2020 0833   CL 105 12/09/2020 0833   CO2 26 12/09/2020 0833   GLUCOSE 102 (H) 12/09/2020 0833   BUN 12 12/09/2020 0833   CREATININE 1.11 12/09/2020 0833   CALCIUM 9.4 12/09/2020 0833   PROT 6.9 12/09/2020 0833   ALBUMIN 4.2 12/09/2020 0833   AST 34 12/09/2020 0833   ALT 35 12/09/2020 0833   ALKPHOS 79 12/09/2020 0833   BILITOT 0.8 12/09/2020 0833   GFRNONAA >60 12/09/2020 0833   GFRAA >60 08/19/2018 0854   Lab Results  Component Value Date   WBC 6.0 12/09/2020   NEUTROABS 4.6 12/09/2020   HGB 16.8 12/09/2020   HCT 44.9 12/09/2020   MCV 90.3 12/09/2020   PLT 120 (L) 12/09/2020    Assessment/Plan 1. Anaplastic astrocytoma [facility])  Mr. Cabreros is clinically  stable today, now having completed first cycle of CCNU and Tibsovo.  No new or progressive changes today.  He is cleared to move forward with chemotherapy today.   Cycle #2 CCNU will be administered orally again at dose level '110mg'$ /m2 every 6 weeks.  Dose reduction may have to be considered for future cycles, if indicated, based on blood counts.  The patient will have a complete blood count and CMP performed on days 14, 28 and 42 of each cycle. Labs may need to be performed more often. Zofran will prescribed for home use for nausea/vomiting.   Will continue dosing Ivosidenib '500mg'$  daily given good control of previously progressive posterior aspect of the mass,  overall very slow progression of active disease.  Chemotherapy should be held for the following:  ANC less than 1,000  Platelets less than 100,000  LFT or creatinine greater than 2x ULN  If clinical concerns/contraindications develop  We ask that Robert Watkins return to clinic in in 6 weeks with MRI brain for review prior to cycle #3 of CCNU.  In interim, we will evaluate lab results every two weeks.    We appreciate the opportunity to participate in the care of Robert Watkins.    All questions were answered. The patient knows to call the clinic with any problems, questions or concerns. No barriers to learning were detected.  I have spent a total of 40 minutes of face-to-face and non-face-to-face time, excluding clinical staff time, preparing to see patient, ordering tests and/or medications, counseling the patient, and independently interpreting results and communicating results to the patient/family/caregiver    Ventura Sellers, MD Medical Director of Neuro-Oncology [facility] at [place] 12/09/20 9:26 AM

## 2020-12-14 ENCOUNTER — Telehealth: Payer: Self-pay | Admitting: Internal Medicine

## 2020-12-14 NOTE — Telephone Encounter (Signed)
Scheduled appts per 8/23 sch msg. Called pt, no answer. Left msg with appts dates and times.

## 2020-12-23 ENCOUNTER — Inpatient Hospital Stay: Payer: Managed Care, Other (non HMO) | Attending: Internal Medicine

## 2020-12-23 ENCOUNTER — Inpatient Hospital Stay: Payer: Managed Care, Other (non HMO)

## 2020-12-23 ENCOUNTER — Other Ambulatory Visit: Payer: Self-pay

## 2020-12-23 DIAGNOSIS — C719 Malignant neoplasm of brain, unspecified: Secondary | ICD-10-CM

## 2020-12-23 DIAGNOSIS — C714 Malignant neoplasm of occipital lobe: Secondary | ICD-10-CM | POA: Diagnosis not present

## 2020-12-23 LAB — CBC WITH DIFFERENTIAL (CANCER CENTER ONLY)
Abs Immature Granulocytes: 0.01 10*3/uL (ref 0.00–0.07)
Basophils Absolute: 0 10*3/uL (ref 0.0–0.1)
Basophils Relative: 0 %
Eosinophils Absolute: 0 10*3/uL (ref 0.0–0.5)
Eosinophils Relative: 1 %
Hemoglobin: 16.6 g/dL (ref 13.0–17.0)
Immature Granulocytes: 0 %
Lymphocytes Relative: 17 %
Lymphs Abs: 0.9 10*3/uL (ref 0.7–4.0)
Monocytes Absolute: 0.2 10*3/uL (ref 0.1–1.0)
Monocytes Relative: 5 %
Neutro Abs: 4.1 10*3/uL (ref 1.7–7.7)
Neutrophils Relative %: 77 %
Platelet Count: 136 10*3/uL — ABNORMAL LOW (ref 150–400)
WBC Count: 5.2 10*3/uL (ref 4.0–10.5)
nRBC: 0 % (ref 0.0–0.2)

## 2020-12-23 LAB — CMP (CANCER CENTER ONLY)
ALT: 23 U/L (ref 0–44)
AST: 20 U/L (ref 15–41)
Albumin: 4.2 g/dL (ref 3.5–5.0)
Alkaline Phosphatase: 74 U/L (ref 38–126)
Anion gap: 9 (ref 5–15)
BUN: 11 mg/dL (ref 6–20)
CO2: 24 mmol/L (ref 22–32)
Calcium: 9.4 mg/dL (ref 8.9–10.3)
Chloride: 106 mmol/L (ref 98–111)
Creatinine: 1.2 mg/dL (ref 0.61–1.24)
GFR, Estimated: >60 mL/min (ref >60)
Glucose, Bld: 93 mg/dL (ref 70–99)
Potassium: 4.3 mmol/L (ref 3.5–5.1)
Sodium: 139 mmol/L (ref 135–145)
Total Bilirubin: 1.2 mg/dL (ref 0.3–1.2)
Total Protein: 7 g/dL (ref 6.5–8.1)

## 2020-12-29 ENCOUNTER — Other Ambulatory Visit: Payer: Self-pay | Admitting: Internal Medicine

## 2020-12-29 ENCOUNTER — Other Ambulatory Visit: Payer: Self-pay | Admitting: Pharmacist

## 2020-12-29 ENCOUNTER — Other Ambulatory Visit (HOSPITAL_COMMUNITY): Payer: Self-pay

## 2020-12-29 DIAGNOSIS — C719 Malignant neoplasm of brain, unspecified: Secondary | ICD-10-CM

## 2020-12-29 MED ORDER — TIBSOVO 250 MG PO TABS: 2.0000 | ORAL_TABLET | Freq: Every day | ORAL | 0 refills | Status: DC

## 2020-12-29 MED ORDER — TIBSOVO 250 MG PO TABS
2.0000 | ORAL_TABLET | Freq: Every day | ORAL | 0 refills | Status: DC
Start: 2020-12-29 — End: 2020-12-29
  Filled 2020-12-29: qty 60, fill #0

## 2020-12-29 NOTE — Progress Notes (Signed)
Oral Oncology Pharmacist Encounter  [name] insurance requires that Tibsovo be filled through [facility] by [employer]. Prescription redirected from [facility] to [facility] for dispensing.  Leron Croak, PharmD, BCPS Hematology/Oncology Clinical Pharmacist [facility] [phone] 12/29/2020 8:45 AM

## 2021-01-03 ENCOUNTER — Other Ambulatory Visit: Payer: Self-pay | Admitting: Radiation Therapy

## 2021-01-06 ENCOUNTER — Inpatient Hospital Stay: Payer: Managed Care, Other (non HMO)

## 2021-01-06 ENCOUNTER — Other Ambulatory Visit: Payer: Self-pay

## 2021-01-06 DIAGNOSIS — C714 Malignant neoplasm of occipital lobe: Secondary | ICD-10-CM | POA: Diagnosis not present

## 2021-01-06 DIAGNOSIS — C719 Malignant neoplasm of brain, unspecified: Secondary | ICD-10-CM

## 2021-01-06 LAB — CBC WITH DIFFERENTIAL (CANCER CENTER ONLY)
Abs Immature Granulocytes: 0.03 10*3/uL (ref 0.00–0.07)
Basophils Absolute: 0 10*3/uL (ref 0.0–0.1)
Basophils Relative: 0 %
Eosinophils Absolute: 0 10*3/uL (ref 0.0–0.5)
Eosinophils Relative: 1 %
HCT: 44.2 % (ref 39.0–52.0)
Hemoglobin: 16.3 g/dL (ref 13.0–17.0)
Immature Granulocytes: 1 %
Lymphocytes Relative: 23 %
Lymphs Abs: 1 10*3/uL (ref 0.7–4.0)
MCH: 34.8 pg — ABNORMAL HIGH (ref 26.0–34.0)
MCHC: 36.9 g/dL — ABNORMAL HIGH (ref 30.0–36.0)
MCV: 94.4 fL (ref 80.0–100.0)
Monocytes Absolute: 0.3 10*3/uL (ref 0.1–1.0)
Monocytes Relative: 6 %
Neutro Abs: 3.1 10*3/uL (ref 1.7–7.7)
Neutrophils Relative %: 69 %
Platelet Count: 136 10*3/uL — ABNORMAL LOW (ref 150–400)
RBC: 4.68 MIL/uL (ref 4.22–5.81)
RDW: 13.8 % (ref 11.5–15.5)
WBC Count: 4.4 10*3/uL (ref 4.0–10.5)
nRBC: 0 % (ref 0.0–0.2)

## 2021-01-06 LAB — CMP (CANCER CENTER ONLY)
ALT: 37 U/L (ref 0–44)
AST: 29 U/L (ref 15–41)
Albumin: 4.2 g/dL (ref 3.5–5.0)
Alkaline Phosphatase: 80 U/L (ref 38–126)
Anion gap: 8 (ref 5–15)
BUN: 17 mg/dL (ref 6–20)
CO2: 25 mmol/L (ref 22–32)
Calcium: 9.4 mg/dL (ref 8.9–10.3)
Chloride: 106 mmol/L (ref 98–111)
Creatinine: 1.06 mg/dL (ref 0.61–1.24)
GFR, Estimated: >60 mL/min (ref >60)
Glucose, Bld: 87 mg/dL (ref 70–99)
Potassium: 3.8 mmol/L (ref 3.5–5.1)
Sodium: 139 mmol/L (ref 135–145)
Total Bilirubin: 0.7 mg/dL (ref 0.3–1.2)
Total Protein: 7 g/dL (ref 6.5–8.1)

## 2021-01-15 ENCOUNTER — Other Ambulatory Visit: Payer: Self-pay

## 2021-01-15 ENCOUNTER — Ambulatory Visit
Admission: RE | Admit: 2021-01-15 | Discharge: 2021-01-15 | Disposition: A | Discharge status: Home / Self Care | Payer: Managed Care, Other (non HMO) | Source: Ambulatory Visit | Attending: Internal Medicine | Admitting: Internal Medicine

## 2021-01-15 DIAGNOSIS — C719 Malignant neoplasm of brain, unspecified: Secondary | ICD-10-CM

## 2021-01-15 MED ORDER — GADOBENATE DIMEGLUMINE 529 MG/ML IV SOLN
18.0000 mL | Freq: Once | INTRAVENOUS | Status: AC | PRN
  Administered 2021-01-15: 18 mL via INTRAVENOUS

## 2021-01-17 ENCOUNTER — Inpatient Hospital Stay: Payer: Managed Care, Other (non HMO)

## 2021-01-18 ENCOUNTER — Other Ambulatory Visit: Payer: Self-pay | Admitting: Internal Medicine

## 2021-01-18 NOTE — Telephone Encounter (Signed)
Patient is aware he needs appt and labs before we will reorder this medication.

## 2021-01-20 ENCOUNTER — Other Ambulatory Visit: Payer: Self-pay | Admitting: *Deleted

## 2021-01-20 ENCOUNTER — Inpatient Hospital Stay: Payer: Managed Care, Other (non HMO)

## 2021-01-20 DIAGNOSIS — C719 Malignant neoplasm of brain, unspecified: Secondary | ICD-10-CM

## 2021-01-20 NOTE — Telephone Encounter (Signed)
Refill for Tibsovo 250 mg faxed to [facility]

## 2021-01-21 ENCOUNTER — Inpatient Hospital Stay: Payer: Managed Care, Other (non HMO)

## 2021-01-21 ENCOUNTER — Other Ambulatory Visit: Payer: Self-pay

## 2021-01-21 DIAGNOSIS — C714 Malignant neoplasm of occipital lobe: Secondary | ICD-10-CM | POA: Diagnosis not present

## 2021-01-21 DIAGNOSIS — C719 Malignant neoplasm of brain, unspecified: Secondary | ICD-10-CM

## 2021-01-21 LAB — CMP (CANCER CENTER ONLY)
ALT: 41 U/L (ref 0–44)
AST: 30 U/L (ref 15–41)
Albumin: 4.1 g/dL (ref 3.5–5.0)
Alkaline Phosphatase: 80 U/L (ref 38–126)
Anion gap: 8 (ref 5–15)
BUN: 15 mg/dL (ref 6–20)
CO2: 23 mmol/L (ref 22–32)
Calcium: 9.2 mg/dL (ref 8.9–10.3)
Chloride: 107 mmol/L (ref 98–111)
Creatinine: 1.09 mg/dL (ref 0.61–1.24)
GFR, Estimated: >60 mL/min (ref >60)
Glucose, Bld: 98 mg/dL (ref 70–99)
Potassium: 4.4 mmol/L (ref 3.5–5.1)
Sodium: 138 mmol/L (ref 135–145)
Total Bilirubin: 1 mg/dL (ref 0.3–1.2)
Total Protein: 6.7 g/dL (ref 6.5–8.1)

## 2021-01-21 LAB — CBC WITH DIFFERENTIAL (CANCER CENTER ONLY)
Abs Immature Granulocytes: 0.03 10*3/uL (ref 0.00–0.07)
Basophils Absolute: 0 10*3/uL (ref 0.0–0.1)
Basophils Relative: 0 %
Eosinophils Absolute: 0.1 10*3/uL (ref 0.0–0.5)
Eosinophils Relative: 1 %
Hemoglobin: 16.6 g/dL (ref 13.0–17.0)
Immature Granulocytes: 1 %
Lymphocytes Relative: 21 %
Lymphs Abs: 1.1 10*3/uL (ref 0.7–4.0)
Monocytes Absolute: 0.3 10*3/uL (ref 0.1–1.0)
Monocytes Relative: 6 %
Neutro Abs: 3.6 10*3/uL (ref 1.7–7.7)
Neutrophils Relative %: 71 %
Platelet Count: 127 10*3/uL — ABNORMAL LOW (ref 150–400)
WBC Count: 5.1 10*3/uL (ref 4.0–10.5)
nRBC: 0 % (ref 0.0–0.2)

## 2021-01-24 ENCOUNTER — Inpatient Hospital Stay: Payer: Managed Care, Other (non HMO) | Admitting: Internal Medicine

## 2021-01-24 ENCOUNTER — Other Ambulatory Visit: Payer: Managed Care, Other (non HMO)

## 2021-02-08 ENCOUNTER — Telehealth: Payer: Self-pay | Admitting: Internal Medicine

## 2021-02-08 NOTE — Telephone Encounter (Signed)
Scheduled per sch msg. Called and left msg  

## 2021-02-10 ENCOUNTER — Inpatient Hospital Stay: Payer: Managed Care, Other (non HMO) | Attending: Internal Medicine | Admitting: Internal Medicine

## 2021-02-10 ENCOUNTER — Other Ambulatory Visit: Payer: Self-pay

## 2021-02-10 ENCOUNTER — Inpatient Hospital Stay: Payer: Managed Care, Other (non HMO)

## 2021-02-10 VITALS — BP 137/83 | HR 57 | Temp 98.2°F | Resp 16 | Ht 71.0 in | Wt 207.0 lb

## 2021-02-10 DIAGNOSIS — R600 Localized edema: Secondary | ICD-10-CM | POA: Insufficient documentation

## 2021-02-10 DIAGNOSIS — G40909 Epilepsy, unspecified, not intractable, without status epilepticus: Secondary | ICD-10-CM | POA: Insufficient documentation

## 2021-02-10 DIAGNOSIS — C714 Malignant neoplasm of occipital lobe: Secondary | ICD-10-CM | POA: Diagnosis present

## 2021-02-10 DIAGNOSIS — C719 Malignant neoplasm of brain, unspecified: Secondary | ICD-10-CM

## 2021-02-10 DIAGNOSIS — R112 Nausea with vomiting, unspecified: Secondary | ICD-10-CM | POA: Diagnosis not present

## 2021-02-10 DIAGNOSIS — Z9221 Personal history of antineoplastic chemotherapy: Secondary | ICD-10-CM | POA: Insufficient documentation

## 2021-02-10 DIAGNOSIS — Z87891 Personal history of nicotine dependence: Secondary | ICD-10-CM | POA: Insufficient documentation

## 2021-02-10 DIAGNOSIS — R5383 Other fatigue: Secondary | ICD-10-CM | POA: Diagnosis not present

## 2021-02-10 DIAGNOSIS — Z923 Personal history of irradiation: Secondary | ICD-10-CM | POA: Diagnosis not present

## 2021-02-10 LAB — CBC WITH DIFFERENTIAL (CANCER CENTER ONLY)
Abs Immature Granulocytes: 0.03 10*3/uL (ref 0.00–0.07)
Basophils Absolute: 0 10*3/uL (ref 0.0–0.1)
Basophils Relative: 0 %
Eosinophils Absolute: 0.1 10*3/uL (ref 0.0–0.5)
Eosinophils Relative: 2 %
Hemoglobin: 16.6 g/dL (ref 13.0–17.0)
Immature Granulocytes: 1 %
Lymphocytes Relative: 27 %
Lymphs Abs: 1.4 10*3/uL (ref 0.7–4.0)
Monocytes Absolute: 0.3 10*3/uL (ref 0.1–1.0)
Monocytes Relative: 6 %
Neutro Abs: 3.2 10*3/uL (ref 1.7–7.7)
Neutrophils Relative %: 64 %
Platelet Count: 151 10*3/uL (ref 150–400)
WBC Count: 5 10*3/uL (ref 4.0–10.5)
nRBC: 0 % (ref 0.0–0.2)

## 2021-02-10 LAB — CMP (CANCER CENTER ONLY)
ALT: 44 U/L (ref 0–44)
AST: 30 U/L (ref 15–41)
Albumin: 4.3 g/dL (ref 3.5–5.0)
Alkaline Phosphatase: 79 U/L (ref 38–126)
Anion gap: 10 (ref 5–15)
BUN: 16 mg/dL (ref 6–20)
CO2: 24 mmol/L (ref 22–32)
Calcium: 9.5 mg/dL (ref 8.9–10.3)
Chloride: 106 mmol/L (ref 98–111)
Creatinine: 1.22 mg/dL (ref 0.61–1.24)
GFR, Estimated: >60 mL/min (ref >60)
Glucose, Bld: 85 mg/dL (ref 70–99)
Potassium: 4.6 mmol/L (ref 3.5–5.1)
Sodium: 140 mmol/L (ref 135–145)
Total Bilirubin: 0.9 mg/dL (ref 0.3–1.2)
Total Protein: 7 g/dL (ref 6.5–8.1)

## 2021-02-10 MED ORDER — LOMUSTINE 100 MG PO CAPS: 100.0000 mg/m2 | ORAL_CAPSULE | Freq: Once | ORAL | 0 refills | Status: AC

## 2021-02-10 MED ORDER — MAGIC MOUTHWASH W/LIDOCAINE: 5.0000 mL | Freq: Three times a day (TID) | ORAL | 1 refills | Status: DC | PRN

## 2021-02-10 NOTE — Progress Notes (Signed)
[facility] [place], [place] [address] [phone]   Interval Evaluation  Date of Service: 02/10/21 Patient Name: Robert Watkins Patient MRN: [id number] Patient DOB: [birth year] Provider: Ventura Sellers, MD  Identifying Statement:  Robert Watkins is a 35 y.o. 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 Kristeen Miss in [place]. 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 [name]. Pathology confirms anaplastic astrocytoma.   06/01/2010 Clinical Event-Other  New patient evaluation at [facility] [place] [facility]. Recommend radiation therapy with concurrent Temozolomide followed by twelve cycles of five-day Temozolomide.   06/13/2010 - 08/01/2010 Radiation  Radiation with Temodar.  06/13/2010 - 08/01/2010 Chemotherapy  Temodar 75 mg/m2 with radiation.  08/16/2010 - 08/07/2011 Chemotherapy  5 day Temodar 200 mg/m2  02/19/2018 Progression  Progression of non-enhancing tumor compared to 2013. Recommend initiating metronomic daily Temodar 50 mg/m2/day.  Interval History:  Robert Watkins presents today for follow up after completing second cycle of oral CCNU with Tibsovo, as well as recent MRI brain.  Overall he has tolerated therapy well.  He does describe some soreness, burning in his mouth with the second cycle.  He otherwise denies any new or progressive changes today.  Continues to describe modest fatigue.  No headaches or seizures.  He remains active and functionally independent. Continues to work full time, recently met with Dr. Imagene Riches at [facility].  Medications: Current Outpatient Medications on File Prior to Visit  Medication Sig  Dispense Refill   amLODipine (NORVASC) 2.5 MG tablet Take 1 tablet (2.5 mg total) by mouth daily. (Patient not taking: No sig reported) 30 tablet 0   GLEOSTINE 100 MG capsule TAKE TWO CAPS BY MOUTH ONCE FOR 1 DOSE EVERY 6 WEEKS. TAKE ON AN EMPTY STOMACH 1 HR BEFORE OR 2 HOURS AFTER A MEAL. 2 capsule 0   GLEOSTINE 100 MG capsule Take by mouth.     Ivosidenib (TIBSOVO) 250 MG TABS Take 2 tablets by mouth daily. 60 tablet 0   levETIRAcetam (KEPPRA) 500 MG tablet Take 1 tablet (500 mg total) by mouth 2 (two) times daily. 60 tablet 3   ondansetron (ZOFRAN) 8 MG tablet Take 1 tablet by mouth 2 times daily as needed. Start on the third day after chemotherapy. 30 tablet 1   No current facility-administered medications on file prior to visit.    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 Recommend radiation therapy with concurrent Temozolomide followed by twelve cycles of five-day Temozolomide. ?   Seizure disorder, secondary ([place]) 04/07/2011   Past Surgical History:  Past Surgical History:  Procedure Laterality Date   CRANIECTOMY / CRANIOTOMY FOR EXCISION OF BRAIN TUMOR Left 05/26/2010   Left parietooccipital craniotomy with tumor resection by Dr. [name]. Pathology confirms anaplastic astrocytoma. ?   CRANIOTOMY FOR STEREOTACTIC GUIDED SURGERY Left 05/12/2010  Stereotactic biopsy of left occipital lesion by Dr. Kristeen Miss in [place]. Pathology demonstrates anaplastic astrocytoma (WHO III)?   Social History:  Social History   Socioeconomic History    Marital status: Single    Spouse name: Not on file   Number of children: Not on file   Years of education: Not on file   Highest education level: Not on file  Occupational History   Not on file  Tobacco Use   Smoking status: Former   Smokeless tobacco: Never  Substance and Sexual Activity   Alcohol use: Yes    Alcohol/week: 4.0 standard drinks    Types: 4 Shots of liquor per week   Drug use: Yes    Types: Marijuana   Sexual activity: Yes  Other Topics Concern   Not on file  Social History Narrative   Not on file   Social Determinants of Health   Financial Resource Strain: Not on file  Food Insecurity: Not on file  Transportation Needs: Not on file  Physical Activity: Not on file  Stress: Not on file  Social Connections: Not on file  Intimate Partner Violence: Not on file   Family History: No family history on file.  Review of Systems: Constitutional: Denies fevers, chills or abnormal weight loss Eyes: Denies blurriness of vision Ears, nose, mouth, throat, and face: Denies mucositis or sore throat Respiratory: Denies cough, dyspnea or wheezes Cardiovascular: Denies palpitation, chest discomfort or lower extremity swelling Gastrointestinal:  Denies nausea, constipation, diarrhea GU: Denies dysuria or incontinence Skin: Denies abnormal skin rashes Neurological: Per HPI Musculoskeletal: Denies joint pain, back or neck discomfort. No decrease in ROM Behavioral/Psych: Denies anxiety, disturbance in thought content, and mood instability  Physical Exam: Vitals:   02/10/21 1121  BP: 137/83  Pulse: (!) 57  Resp: 16  Temp: 98.2 F (36.8 C)  SpO2: 100%    KPS: 100. General: Alert, cooperative, pleasant, in no acute distress Head: Normal EENT: No conjunctival injection or scleral icterus. Oral mucosa moist Lungs: Resp effort normal Cardiac: Regular rate and rhythm Abdomen: Soft, non-distended abdomen Skin: No rashes cyanosis or petechiae. Extremities: No clubbing  or edema  Neurologic Exam: Mental Status: Awake, alert, attentive to examiner. Oriented to self and environment. Language is fluent with intact comprehension.  Cranial Nerves: Visual acuity is grossly normal. Visual fields are full. Extra-ocular movements intact. No ptosis. Face is symmetric, tongue midline. Motor: Tone and bulk are normal. Power is full in both arms and legs. Reflexes are symmetric, no pathologic reflexes present. Intact finger to nose bilaterally Sensory: Intact to light touch and temperature Gait: Normal and tandem gait is normal.   Labs: I have reviewed the data as listed    Component Value Date/Time   NA 138 01/21/2021 1217   K 4.4 01/21/2021 1217   CL 107 01/21/2021 1217   CO2 23 01/21/2021 1217   GLUCOSE 98 01/21/2021 1217   BUN 15 01/21/2021 1217   CREATININE 1.09 01/21/2021 1217   CALCIUM 9.2 01/21/2021 1217   PROT 6.7 01/21/2021 1217   ALBUMIN 4.1 01/21/2021 1217   AST 30 01/21/2021 1217   ALT 41 01/21/2021 1217   ALKPHOS 80 01/21/2021 1217   BILITOT 1.0 01/21/2021 1217   GFRNONAA >60 01/21/2021 1217   GFRAA >60 08/19/2018 0854   Lab Results  Component Value Date   WBC 5.1 01/21/2021   NEUTROABS 3.6 01/21/2021   HGB 16.6 01/21/2021   HCT RESULTS UNAVAILABLE DUE TO INTERFERING SUBSTANCE 01/21/2021   MCV RESULTS  UNAVAILABLE DUE TO INTERFERING SUBSTANCE 01/21/2021   PLT 127 (L) 01/21/2021   Imaging:  [place] Clinician Interpretation: I have personally reviewed the CNS images as listed.  My interpretation, in the context of the patient's clinical presentation, is stable disease  MR BRAIN W WO CONTRAST  Result Date: 01/16/2021 CLINICAL DATA:  Anaplastic astrocytoma.  WHO grade 3. EXAM: MRI HEAD WITHOUT AND WITH CONTRAST TECHNIQUE: Multiplanar, multiecho pulse sequences of the brain and surrounding structures were obtained without and with intravenous contrast. CONTRAST:  58mL MULTIHANCE GADOBENATE DIMEGLUMINE 529 MG/ML IV SOLN COMPARISON:  MRI head  10/15/2020 as well as multiple prior studies. FINDINGS: Brain: Postop resection of tumor in the left occipital lobe. Postsurgical changes above the occipital horn appears stable with T2 and FLAIR hyperintensity and no abnormal enhancement in this area. Additional area of T2 and FLAIR hyperintensity in the medial left occipital lobe appears stable from the recent study however does show progression from earlier studies in 2021. This area measures approximately 14 x 21 mm. No abnormal enhancement. Ventricle size normal. Negative for acute infarct. Remaining white matter normal. Vascular: Normal arterial flow voids Skull and upper cervical spine: Left occipital craniotomy Sinuses/Orbits: Minimal mucosal edema paranasal sinuses. Negative orbit Other: None IMPRESSION: No change from the recent MRI. Rounded area of T2 and FLAIR hyperintensity left medial occipital lobe is stable without abnormal enhancement. This appears larger when compared to studies in 2021. Continued follow-up recommended. Electronically Signed   By: Franchot Gallo M.D.   On: 01/16/2021 10:02    Assessment/Plan 1. Anaplastic astrocytoma [facility])  Robert Watkins is clinically stable today, now having completed second cycle of CCNU and Tibsovo.  No new or progressive changes today.  MRI demonstrates stable findings.  He is cleared to move forward with chemotherapy today.   Cycle #3 CCNU will be administered orally again at dose level $RemoveBe'110mg'ppvpbRNPX$ /m2 every 6 weeks.  Dose reduction may have to be considered for future cycles, if indicated, based on blood counts.  The patient will have a complete blood count and CMP performed on days 14, 28 and 42 of each cycle. Labs may need to be performed more often. Zofran will prescribed for home use for nausea/vomiting.   Will continue dosing Ivosidenib $RemoveBeforeDEI'500mg'[id number]$  daily given good control of previously progressive posterior aspect of the mass, overall very slow progression of active disease.  Chemotherapy should be held  for the following:  ANC less than 1,000  Platelets less than 100,000  LFT or creatinine greater than 2x ULN  If clinical concerns/contraindications develop  Prescribed magic mouthwash 74mL q8 PRN mouth sores as well.  He is ok to visit dentist.  We ask that Robert Watkins return to clinic in in 6 weeks prior to cycle #4 of CCNU.  In interim, we will evaluate lab results every two weeks.  Next MRI in 12 weks.  We appreciate the opportunity to participate in the care of Robert Watkins.    All questions were answered. The patient knows to call the clinic with any problems, questions or concerns. No barriers to learning were detected.  I have spent a total of 30 minutes of face-to-face and non-face-to-face time, excluding clinical staff time, preparing to see patient, ordering tests and/or medications, counseling the patient, and independently interpreting results and communicating results to the patient/family/caregiver    Ventura Sellers, MD Medical Director of Neuro-Oncology [facility] at [place] 02/10/21 11:12 AM

## 2021-02-11 ENCOUNTER — Telehealth: Payer: Self-pay | Admitting: Internal Medicine

## 2021-02-11 NOTE — Telephone Encounter (Signed)
Scheduled appts per 10/20 los. Pt aware.

## 2021-02-17 ENCOUNTER — Other Ambulatory Visit (HOSPITAL_COMMUNITY): Payer: Self-pay

## 2021-02-17 ENCOUNTER — Other Ambulatory Visit: Payer: Self-pay | Admitting: Pharmacist

## 2021-02-17 ENCOUNTER — Other Ambulatory Visit: Payer: Self-pay | Admitting: Internal Medicine

## 2021-02-17 DIAGNOSIS — C719 Malignant neoplasm of brain, unspecified: Secondary | ICD-10-CM

## 2021-02-17 MED ORDER — TIBSOVO 250 MG PO TABS: 2.0000 | ORAL_TABLET | Freq: Every day | ORAL | 0 refills | Status: DC

## 2021-02-17 MED ORDER — TIBSOVO 250 MG PO TABS
2.0000 | ORAL_TABLET | Freq: Every day | ORAL | 0 refills | Status: DC
Start: 2021-02-17 — End: 2021-02-17
  Filled 2021-02-17: qty 60, fill #0

## 2021-02-17 NOTE — Progress Notes (Signed)
Oral Oncology Pharmacist Encounter  [name] insurance requires that Tibsovo be filled through [facility] [employer]. Prescription redirected from [facility] to [facility] for dispensing.  Leron Croak, PharmD, BCPS Hematology/Oncology Clinical Pharmacist [facility] [phone] 02/17/2021 2:42 PM

## 2021-02-24 ENCOUNTER — Other Ambulatory Visit: Payer: Self-pay

## 2021-02-24 ENCOUNTER — Inpatient Hospital Stay: Payer: Managed Care, Other (non HMO) | Attending: Internal Medicine

## 2021-02-24 DIAGNOSIS — C714 Malignant neoplasm of occipital lobe: Secondary | ICD-10-CM | POA: Insufficient documentation

## 2021-02-24 DIAGNOSIS — C719 Malignant neoplasm of brain, unspecified: Secondary | ICD-10-CM

## 2021-02-24 LAB — CBC WITH DIFFERENTIAL (CANCER CENTER ONLY)
Abs Immature Granulocytes: 0.04 10*3/uL (ref 0.00–0.07)
Basophils Absolute: 0 10*3/uL (ref 0.0–0.1)
Basophils Relative: 0 %
Eosinophils Absolute: 0.1 10*3/uL (ref 0.0–0.5)
Eosinophils Relative: 2 %
HCT: 44.2 % (ref 39.0–52.0)
Hemoglobin: 16.5 g/dL (ref 13.0–17.0)
Immature Granulocytes: 1 %
Lymphocytes Relative: 25 %
Lymphs Abs: 1.1 10*3/uL (ref 0.7–4.0)
MCH: 35.3 pg — ABNORMAL HIGH (ref 26.0–34.0)
MCHC: 37.3 g/dL — ABNORMAL HIGH (ref 30.0–36.0)
MCV: 94.6 fL (ref 80.0–100.0)
Monocytes Absolute: 0.3 10*3/uL (ref 0.1–1.0)
Monocytes Relative: 7 %
Neutro Abs: 3 10*3/uL (ref 1.7–7.7)
Neutrophils Relative %: 65 %
Platelet Count: 145 10*3/uL — ABNORMAL LOW (ref 150–400)
RBC: 4.67 MIL/uL (ref 4.22–5.81)
RDW: 11.6 % (ref 11.5–15.5)
WBC Count: 4.6 10*3/uL (ref 4.0–10.5)
nRBC: 0 % (ref 0.0–0.2)

## 2021-02-24 LAB — CMP (CANCER CENTER ONLY)
ALT: 28 U/L (ref 0–44)
AST: 24 U/L (ref 15–41)
Albumin: 4.4 g/dL (ref 3.5–5.0)
Alkaline Phosphatase: 79 U/L (ref 38–126)
Anion gap: 8 (ref 5–15)
BUN: 16 mg/dL (ref 6–20)
CO2: 26 mmol/L (ref 22–32)
Calcium: 9.2 mg/dL (ref 8.9–10.3)
Chloride: 107 mmol/L (ref 98–111)
Creatinine: 1.09 mg/dL (ref 0.61–1.24)
GFR, Estimated: >60 mL/min (ref >60)
Glucose, Bld: 88 mg/dL (ref 70–99)
Potassium: 4.6 mmol/L (ref 3.5–5.1)
Sodium: 141 mmol/L (ref 135–145)
Total Bilirubin: 1.1 mg/dL (ref 0.3–1.2)
Total Protein: 7.2 g/dL (ref 6.5–8.1)

## 2021-03-07 ENCOUNTER — Other Ambulatory Visit: Payer: Self-pay | Admitting: Internal Medicine

## 2021-03-07 DIAGNOSIS — G40909 Epilepsy, unspecified, not intractable, without status epilepticus: Secondary | ICD-10-CM

## 2021-03-10 ENCOUNTER — Other Ambulatory Visit: Payer: Self-pay

## 2021-03-10 ENCOUNTER — Inpatient Hospital Stay: Payer: Managed Care, Other (non HMO)

## 2021-03-10 DIAGNOSIS — C714 Malignant neoplasm of occipital lobe: Secondary | ICD-10-CM | POA: Diagnosis not present

## 2021-03-10 DIAGNOSIS — C719 Malignant neoplasm of brain, unspecified: Secondary | ICD-10-CM

## 2021-03-10 LAB — CMP (CANCER CENTER ONLY)
ALT: 29 U/L (ref 0–44)
AST: 25 U/L (ref 15–41)
Albumin: 4.5 g/dL (ref 3.5–5.0)
Alkaline Phosphatase: 78 U/L (ref 38–126)
Anion gap: 8 (ref 5–15)
BUN: 18 mg/dL (ref 6–20)
CO2: 25 mmol/L (ref 22–32)
Calcium: 9.1 mg/dL (ref 8.9–10.3)
Chloride: 107 mmol/L (ref 98–111)
Creatinine: 1.09 mg/dL (ref 0.61–1.24)
GFR, Estimated: >60 mL/min (ref >60)
Glucose, Bld: 82 mg/dL (ref 70–99)
Potassium: 4.3 mmol/L (ref 3.5–5.1)
Sodium: 140 mmol/L (ref 135–145)
Total Bilirubin: 1 mg/dL (ref 0.3–1.2)
Total Protein: 7.1 g/dL (ref 6.5–8.1)

## 2021-03-10 LAB — CBC WITH DIFFERENTIAL (CANCER CENTER ONLY)
Abs Immature Granulocytes: 0.02 10*3/uL (ref 0.00–0.07)
Basophils Absolute: 0 10*3/uL (ref 0.0–0.1)
Basophils Relative: 0 %
Eosinophils Absolute: 0.1 10*3/uL (ref 0.0–0.5)
Eosinophils Relative: 2 %
HCT: 43.6 % (ref 39.0–52.0)
Hemoglobin: 16.3 g/dL (ref 13.0–17.0)
Immature Granulocytes: 0 %
Lymphocytes Relative: 24 %
Lymphs Abs: 1.2 10*3/uL (ref 0.7–4.0)
MCH: 34.8 pg — ABNORMAL HIGH (ref 26.0–34.0)
MCHC: 37.4 g/dL — ABNORMAL HIGH (ref 30.0–36.0)
MCV: 93 fL (ref 80.0–100.0)
Monocytes Absolute: 0.3 10*3/uL (ref 0.1–1.0)
Monocytes Relative: 6 %
Neutro Abs: 3.2 10*3/uL (ref 1.7–7.7)
Neutrophils Relative %: 68 %
Platelet Count: 143 10*3/uL — ABNORMAL LOW (ref 150–400)
RBC: 4.69 MIL/uL (ref 4.22–5.81)
RDW: 11.2 % — ABNORMAL LOW (ref 11.5–15.5)
WBC Count: 4.8 10*3/uL (ref 4.0–10.5)
nRBC: 0 % (ref 0.0–0.2)

## 2021-03-24 ENCOUNTER — Telehealth: Payer: Self-pay | Admitting: Internal Medicine

## 2021-03-24 ENCOUNTER — Inpatient Hospital Stay: Payer: Managed Care, Other (non HMO) | Attending: Internal Medicine

## 2021-03-24 ENCOUNTER — Other Ambulatory Visit: Payer: Self-pay | Admitting: *Deleted

## 2021-03-24 ENCOUNTER — Other Ambulatory Visit: Payer: Self-pay

## 2021-03-24 ENCOUNTER — Inpatient Hospital Stay (HOSPITAL_BASED_OUTPATIENT_CLINIC_OR_DEPARTMENT_OTHER): Payer: Managed Care, Other (non HMO) | Admitting: Internal Medicine

## 2021-03-24 VITALS — BP 130/86 | HR 59 | Temp 97.9°F | Resp 20 | Wt 202.9 lb

## 2021-03-24 DIAGNOSIS — C714 Malignant neoplasm of occipital lobe: Secondary | ICD-10-CM | POA: Insufficient documentation

## 2021-03-24 DIAGNOSIS — G40909 Epilepsy, unspecified, not intractable, without status epilepticus: Secondary | ICD-10-CM

## 2021-03-24 DIAGNOSIS — R5383 Other fatigue: Secondary | ICD-10-CM | POA: Insufficient documentation

## 2021-03-24 DIAGNOSIS — Z923 Personal history of irradiation: Secondary | ICD-10-CM | POA: Diagnosis not present

## 2021-03-24 DIAGNOSIS — C719 Malignant neoplasm of brain, unspecified: Secondary | ICD-10-CM

## 2021-03-24 DIAGNOSIS — Z9221 Personal history of antineoplastic chemotherapy: Secondary | ICD-10-CM | POA: Insufficient documentation

## 2021-03-24 LAB — CMP (CANCER CENTER ONLY)
ALT: 39 U/L (ref 0–44)
AST: 26 U/L (ref 15–41)
Albumin: 4.4 g/dL (ref 3.5–5.0)
Alkaline Phosphatase: 77 U/L (ref 38–126)
Anion gap: 8 (ref 5–15)
BUN: 14 mg/dL (ref 6–20)
CO2: 25 mmol/L (ref 22–32)
Calcium: 9.1 mg/dL (ref 8.9–10.3)
Chloride: 107 mmol/L (ref 98–111)
Creatinine: 1.15 mg/dL (ref 0.61–1.24)
GFR, Estimated: >60 mL/min (ref >60)
Glucose, Bld: 91 mg/dL (ref 70–99)
Potassium: 4.6 mmol/L (ref 3.5–5.1)
Sodium: 140 mmol/L (ref 135–145)
Total Bilirubin: 1.2 mg/dL (ref 0.3–1.2)
Total Protein: 7.1 g/dL (ref 6.5–8.1)

## 2021-03-24 LAB — CBC WITH DIFFERENTIAL (CANCER CENTER ONLY)
Abs Immature Granulocytes: 0.03 10*3/uL (ref 0.00–0.07)
Basophils Absolute: 0 10*3/uL (ref 0.0–0.1)
Basophils Relative: 0 %
Eosinophils Absolute: 0.1 10*3/uL (ref 0.0–0.5)
Eosinophils Relative: 1 %
Hemoglobin: 16.7 g/dL (ref 13.0–17.0)
Immature Granulocytes: 1 %
Lymphocytes Relative: 21 %
Lymphs Abs: 1 10*3/uL (ref 0.7–4.0)
Monocytes Absolute: 0.3 10*3/uL (ref 0.1–1.0)
Monocytes Relative: 5 %
Neutro Abs: 3.4 10*3/uL (ref 1.7–7.7)
Neutrophils Relative %: 72 %
Platelet Count: 101 10*3/uL — ABNORMAL LOW (ref 150–400)
Smear Review: NORMAL
WBC Count: 4.7 10*3/uL (ref 4.0–10.5)
nRBC: 0 % (ref 0.0–0.2)

## 2021-03-24 MED ORDER — TIBSOVO 250 MG PO TABS
2.0000 | ORAL_TABLET | Freq: Every day | ORAL | 0 refills | Status: DC
Start: 2021-03-24 — End: 2021-06-21

## 2021-03-24 NOTE — Progress Notes (Signed)
Faxed Rx renewal 12/1/222 for Tibsovo to [facility]

## 2021-03-24 NOTE — Progress Notes (Signed)
[facility] [place], [place] [address] [phone]   Interval Evaluation  Date of Service: 03/24/21 Patient Name: Jermanie Minshall Patient MRN: [id number] Patient DOB: Nov 21, 1985 Provider: Ventura Sellers, MD  Identifying Statement:  Harshaan Whang is a 35 y.o. 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 Kristeen Miss in [place]. 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 [name]. Pathology confirms anaplastic astrocytoma.   06/01/2010 Clinical Event-Other  New patient evaluation at [facility] [place] [facility]. Recommend radiation therapy with concurrent Temozolomide followed by twelve cycles of five-day Temozolomide.   06/13/2010 - 08/01/2010 Radiation  Radiation with Temodar.  06/13/2010 - 08/01/2010 Chemotherapy  Temodar 75 mg/m2 with radiation.  08/16/2010 - 08/07/2011 Chemotherapy  5 day Temodar 200 mg/m2  02/19/2018 Progression  Progression of non-enhancing tumor compared to 2013. Recommend initiating metronomic daily Temodar 50 mg/m2/day.  02/12/20 Progression Initiates Tibsovo 500mg  daily  10/21/20 Progression Adds oral CCNU 90mg /m2 q6 weeks to daily Tibsovo  Interval History:  Maleak Brazzel presents today for follow up after completing cycle #3 of oral CCNU with Tibsovo.  Overall he has tolerated therapy well.  No oral issues this month.  He otherwise denies any new or progressive changes today.  Continues to describe modest fatigue.  No headaches or seizures.  He remains active and functionally independent. Continues to work full time.  Medications: Current Outpatient Medications on File Prior to Visit  Medication Sig  Dispense Refill   levETIRAcetam (KEPPRA) 500 MG tablet Take 1 tablet by mouth twice daily 60 tablet 0   magic mouthwash w/lidocaine SOLN Take 5 mLs by mouth 3 (three) times daily as needed for mouth pain. 100 mL 1   ondansetron (ZOFRAN) 8 MG tablet Take 1 tablet by mouth 2 times daily as needed. Start on the third day after chemotherapy. 30 tablet 1   amLODipine (NORVASC) 2.5 MG tablet Take 1 tablet (2.5 mg total) by mouth daily. (Patient not taking: Reported on 06/18/2018) 30 tablet 0   GLEOSTINE 100 MG capsule TAKE TWO CAPS BY MOUTH ONCE FOR 1 DOSE EVERY 6 WEEKS. TAKE ON AN EMPTY STOMACH 1 HR BEFORE OR 2 HOURS AFTER A MEAL. (Patient not taking: Reported on 03/24/2021) 2 capsule 0   GLEOSTINE 100 MG capsule Take by mouth. (Patient not taking: Reported on 03/24/2021)     No current facility-administered medications on file prior to visit.    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 Recommend radiation therapy with concurrent Temozolomide followed by twelve cycles of five-day Temozolomide. ?   Seizure disorder, secondary ([place]) 04/07/2011   Past Surgical History:  Past Surgical History:  Procedure Laterality Date   CRANIECTOMY / CRANIOTOMY FOR EXCISION OF BRAIN TUMOR Left 05/26/2010   Left parietooccipital craniotomy with tumor resection by Dr. [name]. Pathology confirms anaplastic  astrocytoma. ?   CRANIOTOMY FOR STEREOTACTIC GUIDED SURGERY Left 05/12/2010   Stereotactic biopsy of left occipital lesion by Dr. Kristeen Miss in [place]. Pathology demonstrates  anaplastic astrocytoma (WHO III)?   Social History:  Social History   Socioeconomic History   Marital status: Single    Spouse name: Not on file   Number of children: Not on file   Years of education: Not on file   Highest education level: Not on file  Occupational History   Not on file  Tobacco Use   Smoking status: Former   Smokeless tobacco: Never  Substance and Sexual Activity   Alcohol use: Yes    Alcohol/week: 4.0 standard drinks    Types: 4 Shots of liquor per week   Drug use: Yes    Types: Marijuana   Sexual activity: Yes  Other Topics Concern   Not on file  Social History Narrative   Not on file   Social Determinants of Health   Financial Resource Strain: Not on file  Food Insecurity: Not on file  Transportation Needs: Not on file  Physical Activity: Not on file  Stress: Not on file  Social Connections: Not on file  Intimate Partner Violence: Not on file   Family History: No family history on file.  Review of Systems: Constitutional: Denies fevers, chills or abnormal weight loss Eyes: Denies blurriness of vision Ears, nose, mouth, throat, and face: Denies mucositis or sore throat Respiratory: Denies cough, dyspnea or wheezes Cardiovascular: Denies palpitation, chest discomfort or lower extremity swelling Gastrointestinal:  Denies nausea, constipation, diarrhea GU: Denies dysuria or incontinence Skin: Denies abnormal skin rashes Neurological: Per HPI Musculoskeletal: Denies joint pain, back or neck discomfort. No decrease in ROM Behavioral/Psych: Denies anxiety, disturbance in thought content, and mood instability  Physical Exam: Vitals:   03/24/21 1113  BP: 130/86  Pulse: (!) 59  Resp: 20  Temp: 97.9 F (36.6 C)  SpO2: 100%    KPS: 100. General: Alert, cooperative, pleasant, in no acute distress Head: Normal EENT: No conjunctival injection or scleral icterus. Oral mucosa moist Lungs: Resp effort normal Cardiac: Regular rate and  rhythm Abdomen: Soft, non-distended abdomen Skin: No rashes cyanosis or petechiae. Extremities: No clubbing or edema  Neurologic Exam: Mental Status: Awake, alert, attentive to examiner. Oriented to self and environment. Language is fluent with intact comprehension.  Cranial Nerves: Visual acuity is grossly normal. Visual fields are full. Extra-ocular movements intact. No ptosis. Face is symmetric, tongue midline. Motor: Tone and bulk are normal. Power is full in both arms and legs. Reflexes are symmetric, no pathologic reflexes present. Intact finger to nose bilaterally Sensory: Intact to light touch and temperature Gait: Normal and tandem gait is normal.   Labs: I have reviewed the data as listed    Component Value Date/Time   NA 140 03/10/2021 1133   K 4.3 03/10/2021 1133   CL 107 03/10/2021 1133   CO2 25 03/10/2021 1133   GLUCOSE 82 03/10/2021 1133   BUN 18 03/10/2021 1133   CREATININE 1.09 03/10/2021 1133   CALCIUM 9.1 03/10/2021 1133   PROT 7.1 03/10/2021 1133   ALBUMIN 4.5 03/10/2021 1133   AST 25 03/10/2021 1133   ALT 29 03/10/2021 1133   ALKPHOS 78 03/10/2021 1133   BILITOT 1.0 03/10/2021 1133   GFRNONAA >60 03/10/2021 1133   GFRAA >60 08/19/2018 0854   Lab Results  Component Value Date   WBC 4.8 03/10/2021   NEUTROABS 3.2 03/10/2021   HGB 16.3 03/10/2021  HCT 43.6 03/10/2021   MCV 93.0 03/10/2021   PLT 143 (L) 03/10/2021    Assessment/Plan 1. Anaplastic astrocytoma [facility])  Mr. Searls is clinically stable today, now having completed cycle #3 of CCNU and Tibsovo.  No new or progressive changes today.    Cycle #4 CCNU will be administered orally again at dose level 110mg /m2 every 6 weeks.  Dose reduction may have to be considered for future cycles, if indicated, based on blood counts.  The patient will have a complete blood count and CMP performed on days 14, 28 and 42 of each cycle. Labs may need to be performed more often. Zofran will prescribed for home use  for nausea/vomiting.   Start date of cycle #4 will be delayed due to blood counts today.  We will repeat CBC in 2 weeks and plan on resuming therapy at that time, assuming improvement in counts.  Will continue dosing Ivosidenib 500mg  daily given good control of previously progressive posterior aspect of the mass, overall very slow progression of active disease.  Chemotherapy should be held for the following:  ANC less than 1,000  Platelets less than 100,000  LFT or creatinine greater than 2x ULN  If clinical concerns/contraindications develop  We ask that Claudie Rathbone return to clinic in in 6 weeks with MRI brain prior to cycle #5 of CCNU.  In interim, we will evaluate lab results every two weeks.  Next MRI in 12 weks.  We appreciate the opportunity to participate in the care of Majour Frei.    All questions were answered. The patient knows to call the clinic with any problems, questions or concerns. No barriers to learning were detected.  I have spent a total of 30 minutes of face-to-face and non-face-to-face time, excluding clinical staff time, preparing to see patient, ordering tests and/or medications, counseling the patient, and independently interpreting results and communicating results to the patient/family/caregiver    Ventura Sellers, MD Medical Director of Neuro-Oncology [facility] 03/24/21 11:28 AM

## 2021-03-24 NOTE — Telephone Encounter (Signed)
Scheduled per 12/1 los, message has been left with pt

## 2021-03-30 ENCOUNTER — Other Ambulatory Visit: Payer: Self-pay | Admitting: Internal Medicine

## 2021-03-31 NOTE — Telephone Encounter (Signed)
Patient is to have repeat labs prior to refill of Gleostine due to counts being down.  Patient is already scheduled for lab visit.

## 2021-04-05 ENCOUNTER — Other Ambulatory Visit (HOSPITAL_COMMUNITY): Payer: Self-pay

## 2021-04-05 ENCOUNTER — Telehealth: Payer: Self-pay | Admitting: *Deleted

## 2021-04-05 ENCOUNTER — Other Ambulatory Visit: Payer: Self-pay | Admitting: Internal Medicine

## 2021-04-05 DIAGNOSIS — G40909 Epilepsy, unspecified, not intractable, without status epilepticus: Secondary | ICD-10-CM

## 2021-04-05 MED ORDER — LEVETIRACETAM 500 MG PO TABS
500.0000 mg | ORAL_TABLET | Freq: Two times a day (BID) | ORAL | 2 refills | Status: DC
  Filled 2021-04-05: qty 60, 30d supply, fill #0
  Filled 2021-05-06: qty 60, 30d supply, fill #1
  Filled 2021-06-06: qty 60, 30d supply, fill #2
  Filled 2021-07-07: qty 60, 30d supply, fill #3
  Filled 2021-08-06: qty 60, 30d supply, fill #4

## 2021-04-05 NOTE — Telephone Encounter (Signed)
Patient called and needs refill of Keppra.  If MD feels that patient is stable on current dose he asks for 90 day supply with multiple refills.

## 2021-04-07 ENCOUNTER — Inpatient Hospital Stay: Payer: Managed Care, Other (non HMO)

## 2021-04-07 ENCOUNTER — Other Ambulatory Visit: Payer: Self-pay

## 2021-04-07 DIAGNOSIS — C714 Malignant neoplasm of occipital lobe: Secondary | ICD-10-CM | POA: Diagnosis not present

## 2021-04-07 DIAGNOSIS — C719 Malignant neoplasm of brain, unspecified: Secondary | ICD-10-CM

## 2021-04-07 LAB — CBC WITH DIFFERENTIAL (CANCER CENTER ONLY)
Abs Immature Granulocytes: 0.02 10*3/uL (ref 0.00–0.07)
Basophils Absolute: 0 10*3/uL (ref 0.0–0.1)
Basophils Relative: 0 %
Eosinophils Absolute: 0.1 10*3/uL (ref 0.0–0.5)
Eosinophils Relative: 2 %
HCT: 43.2 % (ref 39.0–52.0)
Hemoglobin: 16.1 g/dL (ref 13.0–17.0)
Immature Granulocytes: 0 %
Lymphocytes Relative: 21 %
Lymphs Abs: 0.9 10*3/uL (ref 0.7–4.0)
MCH: 34.8 pg — ABNORMAL HIGH (ref 26.0–34.0)
MCHC: 37.3 g/dL — ABNORMAL HIGH (ref 30.0–36.0)
MCV: 93.3 fL (ref 80.0–100.0)
Monocytes Absolute: 0.2 10*3/uL (ref 0.1–1.0)
Monocytes Relative: 4 %
Neutro Abs: 3.2 10*3/uL (ref 1.7–7.7)
Neutrophils Relative %: 73 %
Platelet Count: 118 10*3/uL — ABNORMAL LOW (ref 150–400)
RBC: 4.63 MIL/uL (ref 4.22–5.81)
RDW: 11.6 % (ref 11.5–15.5)
WBC Count: 4.5 10*3/uL (ref 4.0–10.5)
nRBC: 0 % (ref 0.0–0.2)

## 2021-04-07 LAB — CMP (CANCER CENTER ONLY)
ALT: 26 U/L (ref 0–44)
AST: 19 U/L (ref 15–41)
Albumin: 4.1 g/dL (ref 3.5–5.0)
Alkaline Phosphatase: 76 U/L (ref 38–126)
Anion gap: 8 (ref 5–15)
BUN: 16 mg/dL (ref 6–20)
CO2: 27 mmol/L (ref 22–32)
Calcium: 8.8 mg/dL — ABNORMAL LOW (ref 8.9–10.3)
Chloride: 106 mmol/L (ref 98–111)
Creatinine: 1.16 mg/dL (ref 0.61–1.24)
GFR, Estimated: >60 mL/min
Glucose, Bld: 95 mg/dL (ref 70–99)
Potassium: 4.2 mmol/L (ref 3.5–5.1)
Sodium: 141 mmol/L (ref 135–145)
Total Bilirubin: 1.1 mg/dL (ref 0.3–1.2)
Total Protein: 6.6 g/dL (ref 6.5–8.1)

## 2021-04-08 ENCOUNTER — Other Ambulatory Visit: Payer: Self-pay | Admitting: Internal Medicine

## 2021-04-08 DIAGNOSIS — G40909 Epilepsy, unspecified, not intractable, without status epilepticus: Secondary | ICD-10-CM

## 2021-04-08 NOTE — Telephone Encounter (Signed)
Pt LM stating he is now out of his Keppra because it hasn't been refilled.  I have called the pt back and left a detailed message advising the rx was sent 04/05/21 to the [facility]. Pt was advised he does have multiple pharmacies on his record and it was sent to [facility].

## 2021-04-08 NOTE — Telephone Encounter (Signed)
Signed 3 days ago by Dr. Mickeal Skinner. Gardiner Rhyme, RN

## 2021-04-11 ENCOUNTER — Other Ambulatory Visit: Payer: Self-pay | Admitting: Internal Medicine

## 2021-04-11 ENCOUNTER — Encounter: Payer: Self-pay | Admitting: *Deleted

## 2021-04-11 DIAGNOSIS — C719 Malignant neoplasm of brain, unspecified: Secondary | ICD-10-CM

## 2021-04-11 MED ORDER — LOMUSTINE 100 MG PO CAPS: 100.0000 mg/m2 | ORAL_CAPSULE | Freq: Once | ORAL | 0 refills | Status: AC

## 2021-04-12 ENCOUNTER — Other Ambulatory Visit: Payer: Self-pay | Admitting: Internal Medicine

## 2021-04-12 DIAGNOSIS — C719 Malignant neoplasm of brain, unspecified: Secondary | ICD-10-CM

## 2021-04-26 ENCOUNTER — Inpatient Hospital Stay: Payer: Managed Care, Other (non HMO)

## 2021-04-27 ENCOUNTER — Telehealth: Payer: Self-pay | Admitting: *Deleted

## 2021-04-27 ENCOUNTER — Other Ambulatory Visit: Payer: Self-pay

## 2021-04-27 ENCOUNTER — Inpatient Hospital Stay: Payer: Managed Care, Other (non HMO) | Attending: Internal Medicine

## 2021-04-27 DIAGNOSIS — C719 Malignant neoplasm of brain, unspecified: Secondary | ICD-10-CM

## 2021-04-27 DIAGNOSIS — R5383 Other fatigue: Secondary | ICD-10-CM | POA: Insufficient documentation

## 2021-04-27 DIAGNOSIS — C714 Malignant neoplasm of occipital lobe: Secondary | ICD-10-CM | POA: Insufficient documentation

## 2021-04-27 DIAGNOSIS — Z87891 Personal history of nicotine dependence: Secondary | ICD-10-CM | POA: Insufficient documentation

## 2021-04-27 LAB — CMP (CANCER CENTER ONLY)
ALT: 40 U/L (ref 0–44)
AST: 28 U/L (ref 15–41)
Albumin: 4.4 g/dL (ref 3.5–5.0)
Alkaline Phosphatase: 71 U/L (ref 38–126)
Anion gap: 8 (ref 5–15)
BUN: 13 mg/dL (ref 6–20)
CO2: 24 mmol/L (ref 22–32)
Calcium: 9.4 mg/dL (ref 8.9–10.3)
Chloride: 106 mmol/L (ref 98–111)
Creatinine: 1.12 mg/dL (ref 0.61–1.24)
GFR, Estimated: >60 mL/min (ref >60)
Glucose, Bld: 106 mg/dL — ABNORMAL HIGH (ref 70–99)
Potassium: 4.2 mmol/L (ref 3.5–5.1)
Sodium: 138 mmol/L (ref 135–145)
Total Bilirubin: 1.1 mg/dL (ref 0.3–1.2)
Total Protein: 6.9 g/dL (ref 6.5–8.1)

## 2021-04-27 LAB — CBC WITH DIFFERENTIAL (CANCER CENTER ONLY)
Abs Immature Granulocytes: 0.02 10*3/uL (ref 0.00–0.07)
Basophils Absolute: 0 10*3/uL (ref 0.0–0.1)
Basophils Relative: 0 %
Eosinophils Absolute: 0.1 10*3/uL (ref 0.0–0.5)
Eosinophils Relative: 2 %
Hemoglobin: 16.8 g/dL (ref 13.0–17.0)
Immature Granulocytes: 0 %
Lymphocytes Relative: 19 %
Lymphs Abs: 0.8 10*3/uL (ref 0.7–4.0)
Monocytes Absolute: 0.2 10*3/uL (ref 0.1–1.0)
Monocytes Relative: 5 %
Neutro Abs: 3.3 10*3/uL (ref 1.7–7.7)
Neutrophils Relative %: 74 %
Platelet Count: 134 10*3/uL — ABNORMAL LOW (ref 150–400)
WBC Count: 4.5 10*3/uL (ref 4.0–10.5)
nRBC: 0 % (ref 0.0–0.2)

## 2021-04-27 NOTE — Telephone Encounter (Signed)
Received VM from patient with questions regarding a bill from [place] related to MRI from 09/2020. TCT patient to get more information. Patient states that he received bill for $2200 from MRI in June and wonders why. Patient started new job around time of MRI and started new insurance with [employer]. Advised to reach out to [employer] to gather info about benefits. Patient voiced understanding and advised to call with further questions.

## 2021-05-05 ENCOUNTER — Other Ambulatory Visit: Payer: Self-pay | Admitting: Radiation Therapy

## 2021-05-06 ENCOUNTER — Other Ambulatory Visit (HOSPITAL_COMMUNITY): Payer: Self-pay

## 2021-05-09 ENCOUNTER — Encounter: Payer: Self-pay | Admitting: *Deleted

## 2021-05-10 ENCOUNTER — Other Ambulatory Visit: Payer: Self-pay

## 2021-05-10 ENCOUNTER — Inpatient Hospital Stay: Payer: Managed Care, Other (non HMO)

## 2021-05-10 DIAGNOSIS — C719 Malignant neoplasm of brain, unspecified: Secondary | ICD-10-CM

## 2021-05-10 DIAGNOSIS — C714 Malignant neoplasm of occipital lobe: Secondary | ICD-10-CM | POA: Diagnosis not present

## 2021-05-10 LAB — CBC WITH DIFFERENTIAL (CANCER CENTER ONLY)
Abs Immature Granulocytes: 0.02 10*3/uL (ref 0.00–0.07)
Basophils Absolute: 0 10*3/uL (ref 0.0–0.1)
Basophils Relative: 0 %
Eosinophils Absolute: 0.1 10*3/uL (ref 0.0–0.5)
Eosinophils Relative: 1 %
HCT: 43.2 % (ref 39.0–52.0)
Hemoglobin: 16.1 g/dL (ref 13.0–17.0)
Immature Granulocytes: 0 %
Lymphocytes Relative: 20 %
Lymphs Abs: 1 10*3/uL (ref 0.7–4.0)
MCH: 35.3 pg — ABNORMAL HIGH (ref 26.0–34.0)
MCHC: 37.3 g/dL — ABNORMAL HIGH (ref 30.0–36.0)
MCV: 94.7 fL (ref 80.0–100.0)
Monocytes Absolute: 0.3 10*3/uL (ref 0.1–1.0)
Monocytes Relative: 7 %
Neutro Abs: 3.4 10*3/uL (ref 1.7–7.7)
Neutrophils Relative %: 72 %
Platelet Count: 132 10*3/uL — ABNORMAL LOW (ref 150–400)
RBC: 4.56 MIL/uL (ref 4.22–5.81)
RDW: 12.2 % (ref 11.5–15.5)
WBC Count: 4.7 10*3/uL (ref 4.0–10.5)
nRBC: 0 % (ref 0.0–0.2)

## 2021-05-10 LAB — CMP (CANCER CENTER ONLY)
ALT: 26 U/L (ref 0–44)
AST: 20 U/L (ref 15–41)
Albumin: 4.5 g/dL (ref 3.5–5.0)
Alkaline Phosphatase: 66 U/L (ref 38–126)
Anion gap: 6 (ref 5–15)
BUN: 13 mg/dL (ref 6–20)
CO2: 27 mmol/L (ref 22–32)
Calcium: 9.5 mg/dL (ref 8.9–10.3)
Chloride: 105 mmol/L (ref 98–111)
Creatinine: 1.14 mg/dL (ref 0.61–1.24)
GFR, Estimated: >60 mL/min (ref >60)
Glucose, Bld: 96 mg/dL (ref 70–99)
Potassium: 4.4 mmol/L (ref 3.5–5.1)
Sodium: 138 mmol/L (ref 135–145)
Total Bilirubin: 1.2 mg/dL (ref 0.3–1.2)
Total Protein: 6.8 g/dL (ref 6.5–8.1)

## 2021-05-20 ENCOUNTER — Ambulatory Visit
Admission: RE | Admit: 2021-05-20 | Discharge: 2021-05-20 | Disposition: A | Discharge status: Home / Self Care | Payer: Managed Care, Other (non HMO) | Source: Ambulatory Visit | Attending: Internal Medicine | Admitting: Internal Medicine

## 2021-05-20 DIAGNOSIS — C719 Malignant neoplasm of brain, unspecified: Secondary | ICD-10-CM

## 2021-05-20 MED ORDER — GADOBENATE DIMEGLUMINE 529 MG/ML IV SOLN
18.0000 mL | Freq: Once | INTRAVENOUS | Status: AC | PRN
  Administered 2021-05-20: 18 mL via INTRAVENOUS

## 2021-05-23 ENCOUNTER — Inpatient Hospital Stay: Payer: Managed Care, Other (non HMO)

## 2021-05-23 ENCOUNTER — Other Ambulatory Visit: Payer: Self-pay | Admitting: Internal Medicine

## 2021-05-24 ENCOUNTER — Other Ambulatory Visit: Payer: Self-pay

## 2021-05-24 ENCOUNTER — Encounter: Payer: Self-pay | Admitting: Internal Medicine

## 2021-05-24 ENCOUNTER — Inpatient Hospital Stay: Payer: Managed Care, Other (non HMO)

## 2021-05-24 ENCOUNTER — Inpatient Hospital Stay (HOSPITAL_BASED_OUTPATIENT_CLINIC_OR_DEPARTMENT_OTHER): Payer: Managed Care, Other (non HMO) | Admitting: Internal Medicine

## 2021-05-24 VITALS — BP 133/80 | HR 61 | Temp 97.0°F | Resp 17 | Ht 71.0 in | Wt 202.2 lb

## 2021-05-24 DIAGNOSIS — G40909 Epilepsy, unspecified, not intractable, without status epilepticus: Secondary | ICD-10-CM

## 2021-05-24 DIAGNOSIS — C719 Malignant neoplasm of brain, unspecified: Secondary | ICD-10-CM | POA: Diagnosis not present

## 2021-05-24 DIAGNOSIS — C714 Malignant neoplasm of occipital lobe: Secondary | ICD-10-CM | POA: Diagnosis not present

## 2021-05-24 LAB — CBC WITH DIFFERENTIAL (CANCER CENTER ONLY)
Abs Immature Granulocytes: 0.03 10*3/uL (ref 0.00–0.07)
Basophils Absolute: 0 10*3/uL (ref 0.0–0.1)
Basophils Relative: 1 %
Eosinophils Absolute: 0.1 10*3/uL (ref 0.0–0.5)
Eosinophils Relative: 1 %
Hemoglobin: 16.7 g/dL (ref 13.0–17.0)
Immature Granulocytes: 1 %
Lymphocytes Relative: 21 %
Lymphs Abs: 1.1 10*3/uL (ref 0.7–4.0)
Monocytes Absolute: 0.3 10*3/uL (ref 0.1–1.0)
Monocytes Relative: 5 %
Neutro Abs: 3.7 10*3/uL (ref 1.7–7.7)
Neutrophils Relative %: 71 %
Platelet Count: 121 10*3/uL — ABNORMAL LOW (ref 150–400)
WBC Count: 5.2 10*3/uL (ref 4.0–10.5)

## 2021-05-24 LAB — CMP (CANCER CENTER ONLY)
ALT: 27 U/L (ref 0–44)
AST: 21 U/L (ref 15–41)
Albumin: 4.7 g/dL (ref 3.5–5.0)
Alkaline Phosphatase: 67 U/L (ref 38–126)
Anion gap: 5 (ref 5–15)
BUN: 14 mg/dL (ref 6–20)
CO2: 29 mmol/L (ref 22–32)
Calcium: 9.6 mg/dL (ref 8.9–10.3)
Chloride: 105 mmol/L (ref 98–111)
Creatinine: 1.15 mg/dL (ref 0.61–1.24)
GFR, Estimated: >60 mL/min (ref >60)
Glucose, Bld: 94 mg/dL (ref 70–99)
Potassium: 4.3 mmol/L (ref 3.5–5.1)
Sodium: 139 mmol/L (ref 135–145)
Total Bilirubin: 1.1 mg/dL (ref 0.3–1.2)
Total Protein: 7.1 g/dL (ref 6.5–8.1)

## 2021-05-24 NOTE — Progress Notes (Signed)
[facility] at [place],  [address] [phone]   Interval Evaluation  Date of Service: 05/24/21 Patient Name: Robert Watkins Patient MRN: [id number] Patient DOB: 1985/06/29 Provider: Ventura Sellers, MD  Identifying Statement:  Robert Watkins is a 36 y.o. 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 Kristeen Miss in [place]. 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 [name]. Pathology confirms anaplastic astrocytoma.   06/01/2010 Clinical Event-Other  New patient evaluation at [facility] [place] [facility]. Recommend radiation therapy with concurrent Temozolomide followed by twelve cycles of five-day Temozolomide.   06/13/2010 - 08/01/2010 Radiation  Radiation with Temodar.  06/13/2010 - 08/01/2010 Chemotherapy  Temodar 75 mg/m2 with radiation.  08/16/2010 - 08/07/2011 Chemotherapy  5 day Temodar 200 mg/m2  02/19/2018 Progression  Progression of non-enhancing tumor compared to 2013. Recommend initiating metronomic daily Temodar 50 mg/m2/day.  02/12/20 Progression Initiates Tibsovo 500mg  daily  10/21/20 Progression Adds oral CCNU 90mg /m2 q6 weeks to daily Tibsovo  Interval History:  Robert Watkins presents today for follow up after completing cycle #4 of oral CCNU with Tibsovo.  Continues to tolerate treatment well without issue.  He otherwise denies any new or progressive changes today.  Continues to describe modest fatigue.  No headaches or seizures.  He remains active and functionally independent. Continues to work full time.  Medications: Current Outpatient Medications on File Prior to Visit  Medication Sig Dispense Refill    Ivosidenib (TIBSOVO) 250 MG TABS Take 2 tablets by mouth daily. 60 tablet 0   levETIRAcetam (KEPPRA) 500 MG tablet Take 1 tablet by mouth 2 times daily. 180 tablet 2   ondansetron (ZOFRAN) 8 MG tablet Take 1 tablet by mouth 2 times daily as needed. Start on the third day after chemotherapy. 30 tablet 1   GLEOSTINE 100 MG capsule TAKE TWO CAPS BY MOUTH ONCE FOR 1 DOSE EVERY 6 WEEKS. TAKE ON AN EMPTY STOMACH 1 HR BEFORE OR 2 HOURS AFTER A MEAL. (Patient not taking: Reported on 03/24/2021) 2 capsule 0   GLEOSTINE 100 MG capsule Take by mouth. (Patient not taking: Reported on 03/24/2021)     No current facility-administered medications on file prior to visit.    Allergies:  Allergies  Allergen Reactions   Lamotrigine Rash   Past Medical History:  Past Medical History:  Diagnosis Date   Cancer of brain ([place]) 04/07/2011   Depression, reactive 04/07/2011   History of chemotherapy 06/13/2010-08/01/2010   42 day Temodar with concurrent Radiation   History of chemotherapy 08/16/2010-08/07/2011   5 day Temodar 200 mg/m2   History of radiation therapy 06/13/2010-08/01/2010   [facility] [place] [facility]. Recommend radiation therapy with concurrent Temozolomide followed by twelve cycles of five-day Temozolomide. ?   Seizure disorder, secondary ([place]) 04/07/2011   Past Surgical History:  Past Surgical History:  Procedure Laterality Date   CRANIECTOMY / CRANIOTOMY FOR EXCISION OF BRAIN TUMOR Left 05/26/2010   Left parietooccipital craniotomy with tumor resection by Dr. [name]. Pathology confirms anaplastic astrocytoma. ?   CRANIOTOMY FOR STEREOTACTIC GUIDED SURGERY Left 05/12/2010   Stereotactic biopsy of left occipital lesion by Dr. Kristeen Miss in [place]. Pathology demonstrates anaplastic astrocytoma (WHO III)?   Social History:  Social History   Socioeconomic History   Marital status: Single    Spouse name: Not on file   Number of children: Not on file    Years of education: Not on file   Highest education level: Not on file  Occupational History   Not on file  Tobacco Use   Smoking status: Former   Smokeless tobacco: Never  Substance and Sexual Activity   Alcohol use: Yes    Alcohol/week: 4.0 standard drinks    Types: 4 Shots of liquor per week   Drug use: Yes    Types: Marijuana   Sexual activity: Yes  Other Topics Concern   Not on file  Social History Narrative   Not on file   Social Determinants of Health   Financial Resource Strain: Not on file  Food Insecurity: Not on file  Transportation Needs: Not on file  Physical Activity: Not on file  Stress: Not on file  Social Connections: Not on file  Intimate Partner Violence: Not on file   Family History: No family history on file.  Review of Systems: Constitutional: Denies fevers, chills or abnormal weight loss Eyes: Denies blurriness of vision Ears, nose, mouth, throat, and face: Denies mucositis or sore throat Respiratory: Denies cough, dyspnea or wheezes Cardiovascular: Denies palpitation, chest discomfort or lower extremity swelling Gastrointestinal:  Denies nausea, constipation, diarrhea GU: Denies dysuria or incontinence Skin: Denies abnormal skin rashes Neurological: Per HPI Musculoskeletal: Denies joint pain, back or neck discomfort. No decrease in ROM Behavioral/Psych: Denies anxiety, disturbance in thought content, and mood instability  Physical Exam: Vitals:   05/24/21 1127  BP: 133/80  Pulse: 61  Resp: 17  Temp: (!) 97 F (36.1 C)  SpO2: 100%    KPS: 100. General: Alert, cooperative, pleasant, in no acute distress Head: Normal EENT: No conjunctival injection or scleral icterus. Oral mucosa moist Lungs: Resp effort normal Cardiac: Regular rate and rhythm Abdomen: Soft, non-distended abdomen Skin: No rashes cyanosis or petechiae. Extremities: No clubbing or edema  Neurologic Exam: Mental Status: Awake, alert, attentive to examiner. Oriented  to self and environment. Language is fluent with intact comprehension.  Cranial Nerves: Visual acuity is grossly normal. Visual fields are full. Extra-ocular movements intact. No ptosis. Face is symmetric, tongue midline. Motor: Tone and bulk are normal. Power is full in both arms and legs. Reflexes are symmetric, no pathologic reflexes present. Intact finger to nose bilaterally Sensory: Intact to light touch and temperature Gait: Normal and tandem gait is normal.   Labs: I have reviewed the data as listed    Component Value Date/Time   NA 139 05/24/2021 1100   K 4.3 05/24/2021 1100   CL 105 05/24/2021 1100   CO2 29 05/24/2021 1100   GLUCOSE 94 05/24/2021 1100   BUN 14 05/24/2021 1100   CREATININE 1.15 05/24/2021 1100   CALCIUM 9.6 05/24/2021 1100   PROT 7.1 05/24/2021 1100   ALBUMIN 4.7 05/24/2021 1100   AST 21 05/24/2021 1100   ALT 27 05/24/2021 1100   ALKPHOS 67 05/24/2021 1100   BILITOT 1.1 05/24/2021 1100   GFRNONAA >60 05/24/2021 1100   GFRAA >60 08/19/2018 0854   Lab Results  Component Value Date   WBC 4.7 05/10/2021   NEUTROABS 3.4 05/10/2021   HGB 16.1 05/10/2021   HCT 43.2 05/10/2021   MCV 94.7 05/10/2021   PLT 132 (L) 05/10/2021   Imaging:  [place] Clinician Interpretation: I have personally reviewed the CNS images as listed.  My interpretation, in the  context of the patient's clinical presentation, is stable disease  MR BRAIN W WO CONTRAST  Result Date: 05/20/2021 CLINICAL DATA:  Anaplastic astrocytoma. EXAM: MRI HEAD WITHOUT AND WITH CONTRAST TECHNIQUE: Multiplanar, multiecho pulse sequences of the brain and surrounding structures were obtained without and with intravenous contrast. CONTRAST:  27mL MULTIHANCE GADOBENATE DIMEGLUMINE 529 MG/ML IV SOLN COMPARISON:  Head MRI 01/15/2021 FINDINGS: Brain: Sequelae of left occipital tumor resection are again identified. Nonenhancing T2 hyperintensity adjacent to the left occipital resection cavity which is partially  rounded and masslike is unchanged from the most recent prior MRI (though again noted to demonstrate slow progression since 2021). No abnormal enhancement or progressive mass effect is identified. There is ex vacuo dilatation of the occipital horn of the left lateral ventricle. A small amount of chronic blood products are noted at the resection site. No acute infarct or significant extra-axial fluid collection is identified. Vascular: Major intracranial vascular flow voids are preserved. Skull and upper cervical spine: Left parieto-occipital craniotomy. Sinuses/Orbits: Unremarkable orbits. Paranasal sinuses and mastoid air cells are clear. Other: None. IMPRESSION: Unchanged T2 signal abnormality adjacent to the left occipital resection cavity since 01/15/2021. No abnormal enhancement. Electronically Signed   By: Logan Bores M.D.   On: 05/20/2021 21:04    Assessment/Plan 1. Anaplastic astrocytoma [facility])  Robert Watkins is clinically stable today, now having completed cycle #4 of CCNU and Tibsovo.  MRI demonstrates stable findings.  Cycle #5 CCNU will be administered orally again at dose level 110mg /m2 every 6 weeks.  Dose reduction may have to be considered for future cycles, if indicated, based on blood counts.  The patient will have a complete blood count and CMP performed on days 14, 28 and 42 of each cycle. Labs may need to be performed more often. Zofran will prescribed for home use for nausea/vomiting.   Will continue dosing Ivosidenib 500mg  daily given good control of previously progressive posterior aspect of the mass, overall very slow progression of active disease.  Chemotherapy should be held for the following:  ANC less than 1,000  Platelets less than 100,000  LFT or creatinine greater than 2x ULN  If clinical concerns/contraindications develop  We ask that Robert Watkins return to clinic in in 6 weeks with MRI brain prior to cycle #6 of CCNU.  In interim, we will evaluate lab results every  two weeks.  Next MRI in 12 weks.  We appreciate the opportunity to participate in the care of Rosaire Cueto.    All questions were answered. The patient knows to call the clinic with any problems, questions or concerns. No barriers to learning were detected.  I have spent a total of 40 minutes of face-to-face and non-face-to-face time, excluding clinical staff time, preparing to see patient, ordering tests and/or medications, counseling the patient, and independently interpreting results and communicating results to the patient/family/caregiver    Ventura Sellers, MD Medical Director of Neuro-Oncology [facility] [place] 05/24/21 11:41 AM

## 2021-05-25 ENCOUNTER — Telehealth: Payer: Self-pay | Admitting: Internal Medicine

## 2021-05-25 NOTE — Telephone Encounter (Signed)
Scheduled per 1/31 los, message has been left with pt

## 2021-06-02 ENCOUNTER — Telehealth: Payer: Self-pay | Admitting: *Deleted

## 2021-06-02 NOTE — Telephone Encounter (Signed)
Attempted tor reach patient to communicate the information.left message pending call back.

## 2021-06-02 NOTE — Telephone Encounter (Signed)
Patient called wanting opinion/clearance.  States he may have an opportunity to go to [place] this April towards the end of the month.  He wasn't sure if it was feasible or safe given he is currently in treatment.  Also unsure if the timing would allow for a 10 day trip.    Routing to provider.

## 2021-06-03 ENCOUNTER — Telehealth: Payer: Self-pay

## 2021-06-03 NOTE — Telephone Encounter (Signed)
[facility] called to ask for update concerning feasibility of trip to [place]. Relayed Dr. Renda Rolls support of trip per chart communication from 06/02/21.

## 2021-06-06 ENCOUNTER — Other Ambulatory Visit (HOSPITAL_COMMUNITY): Payer: Self-pay

## 2021-06-07 ENCOUNTER — Inpatient Hospital Stay: Payer: Managed Care, Other (non HMO) | Attending: Internal Medicine

## 2021-06-07 ENCOUNTER — Other Ambulatory Visit: Payer: Self-pay

## 2021-06-07 DIAGNOSIS — C719 Malignant neoplasm of brain, unspecified: Secondary | ICD-10-CM

## 2021-06-07 DIAGNOSIS — C714 Malignant neoplasm of occipital lobe: Secondary | ICD-10-CM | POA: Diagnosis present

## 2021-06-07 LAB — CBC WITH DIFFERENTIAL (CANCER CENTER ONLY)
Abs Immature Granulocytes: 0.03 10*3/uL (ref 0.00–0.07)
Basophils Absolute: 0 10*3/uL (ref 0.0–0.1)
Basophils Relative: 0 %
Eosinophils Absolute: 0.1 10*3/uL (ref 0.0–0.5)
Eosinophils Relative: 1 %
HCT: 45.2 % (ref 39.0–52.0)
Hemoglobin: 16.9 g/dL (ref 13.0–17.0)
Immature Granulocytes: 1 %
Lymphocytes Relative: 19 %
Lymphs Abs: 1 10*3/uL (ref 0.7–4.0)
MCH: 35.4 pg — ABNORMAL HIGH (ref 26.0–34.0)
MCHC: 37.4 g/dL — ABNORMAL HIGH (ref 30.0–36.0)
MCV: 94.8 fL (ref 80.0–100.0)
Monocytes Absolute: 0.3 10*3/uL (ref 0.1–1.0)
Monocytes Relative: 5 %
Neutro Abs: 3.9 10*3/uL (ref 1.7–7.7)
Neutrophils Relative %: 74 %
Platelet Count: 147 10*3/uL — ABNORMAL LOW (ref 150–400)
RBC: 4.77 MIL/uL (ref 4.22–5.81)
RDW: 12 % (ref 11.5–15.5)
WBC Count: 5.3 10*3/uL (ref 4.0–10.5)
nRBC: 0 % (ref 0.0–0.2)

## 2021-06-07 LAB — CMP (CANCER CENTER ONLY)
ALT: 33 U/L (ref 0–44)
AST: 25 U/L (ref 15–41)
Albumin: 4.7 g/dL (ref 3.5–5.0)
Alkaline Phosphatase: 63 U/L (ref 38–126)
Anion gap: 5 (ref 5–15)
BUN: 14 mg/dL (ref 6–20)
CO2: 29 mmol/L (ref 22–32)
Calcium: 9.2 mg/dL (ref 8.9–10.3)
Chloride: 103 mmol/L (ref 98–111)
Creatinine: 1.02 mg/dL (ref 0.61–1.24)
GFR, Estimated: >60 mL/min (ref >60)
Glucose, Bld: 90 mg/dL (ref 70–99)
Potassium: 4.3 mmol/L (ref 3.5–5.1)
Sodium: 137 mmol/L (ref 135–145)
Total Bilirubin: 1.1 mg/dL (ref 0.3–1.2)
Total Protein: 7 g/dL (ref 6.5–8.1)

## 2021-06-21 ENCOUNTER — Other Ambulatory Visit: Payer: Self-pay

## 2021-06-21 ENCOUNTER — Other Ambulatory Visit: Payer: Self-pay | Admitting: *Deleted

## 2021-06-21 ENCOUNTER — Inpatient Hospital Stay: Payer: Managed Care, Other (non HMO)

## 2021-06-21 DIAGNOSIS — C714 Malignant neoplasm of occipital lobe: Secondary | ICD-10-CM | POA: Diagnosis not present

## 2021-06-21 DIAGNOSIS — C719 Malignant neoplasm of brain, unspecified: Secondary | ICD-10-CM

## 2021-06-21 LAB — CBC WITH DIFFERENTIAL (CANCER CENTER ONLY)
Abs Immature Granulocytes: 0.03 10*3/uL (ref 0.00–0.07)
Basophils Absolute: 0 10*3/uL (ref 0.0–0.1)
Basophils Relative: 0 %
Eosinophils Absolute: 0 10*3/uL (ref 0.0–0.5)
Eosinophils Relative: 1 %
Hemoglobin: 16.1 g/dL (ref 13.0–17.0)
Immature Granulocytes: 1 %
Lymphocytes Relative: 24 %
Lymphs Abs: 1 10*3/uL (ref 0.7–4.0)
Monocytes Absolute: 0.2 10*3/uL (ref 0.1–1.0)
Monocytes Relative: 6 %
Neutro Abs: 2.7 10*3/uL (ref 1.7–7.7)
Neutrophils Relative %: 68 %
Platelet Count: 114 10*3/uL — ABNORMAL LOW (ref 150–400)
WBC Count: 4 10*3/uL (ref 4.0–10.5)
nRBC: 0 % (ref 0.0–0.2)

## 2021-06-21 LAB — CMP (CANCER CENTER ONLY)
ALT: 33 U/L (ref 0–44)
AST: 24 U/L (ref 15–41)
Albumin: 4.6 g/dL (ref 3.5–5.0)
Alkaline Phosphatase: 75 U/L (ref 38–126)
Anion gap: 5 (ref 5–15)
BUN: 13 mg/dL (ref 6–20)
CO2: 29 mmol/L (ref 22–32)
Calcium: 9.7 mg/dL (ref 8.9–10.3)
Chloride: 105 mmol/L (ref 98–111)
Creatinine: 1.05 mg/dL (ref 0.61–1.24)
GFR, Estimated: >60 mL/min (ref >60)
Glucose, Bld: 95 mg/dL (ref 70–99)
Potassium: 4.3 mmol/L (ref 3.5–5.1)
Sodium: 139 mmol/L (ref 135–145)
Total Bilirubin: 1.1 mg/dL (ref 0.3–1.2)
Total Protein: 7 g/dL (ref 6.5–8.1)

## 2021-06-21 MED ORDER — TIBSOVO 250 MG PO TABS: 2.0000 | ORAL_TABLET | Freq: Every day | ORAL | 0 refills | Status: DC

## 2021-06-21 NOTE — Progress Notes (Signed)
Faxed signed Rx to [phone] to [facility] for Tibsovo 250 mg tablet Quanitiy 60.

## 2021-07-03 ENCOUNTER — Other Ambulatory Visit: Payer: Self-pay | Admitting: Internal Medicine

## 2021-07-04 ENCOUNTER — Encounter: Payer: Self-pay | Admitting: Internal Medicine

## 2021-07-05 ENCOUNTER — Inpatient Hospital Stay (HOSPITAL_BASED_OUTPATIENT_CLINIC_OR_DEPARTMENT_OTHER): Payer: Managed Care, Other (non HMO) | Admitting: Internal Medicine

## 2021-07-05 ENCOUNTER — Other Ambulatory Visit: Payer: Self-pay

## 2021-07-05 ENCOUNTER — Inpatient Hospital Stay: Payer: Managed Care, Other (non HMO) | Attending: Internal Medicine

## 2021-07-05 VITALS — BP 118/52 | HR 52 | Temp 98.1°F

## 2021-07-05 DIAGNOSIS — C719 Malignant neoplasm of brain, unspecified: Secondary | ICD-10-CM

## 2021-07-05 DIAGNOSIS — C714 Malignant neoplasm of occipital lobe: Secondary | ICD-10-CM | POA: Insufficient documentation

## 2021-07-05 DIAGNOSIS — Z87891 Personal history of nicotine dependence: Secondary | ICD-10-CM | POA: Diagnosis not present

## 2021-07-05 DIAGNOSIS — Z79899 Other long term (current) drug therapy: Secondary | ICD-10-CM | POA: Insufficient documentation

## 2021-07-05 DIAGNOSIS — Z923 Personal history of irradiation: Secondary | ICD-10-CM | POA: Diagnosis not present

## 2021-07-05 DIAGNOSIS — G40909 Epilepsy, unspecified, not intractable, without status epilepticus: Secondary | ICD-10-CM

## 2021-07-05 DIAGNOSIS — Z9221 Personal history of antineoplastic chemotherapy: Secondary | ICD-10-CM | POA: Insufficient documentation

## 2021-07-05 LAB — CMP (CANCER CENTER ONLY)
ALT: 37 U/L (ref 0–44)
AST: 26 U/L (ref 15–41)
Albumin: 4.6 g/dL (ref 3.5–5.0)
Alkaline Phosphatase: 74 U/L (ref 38–126)
Anion gap: 7 (ref 5–15)
BUN: 10 mg/dL (ref 6–20)
CO2: 28 mmol/L (ref 22–32)
Calcium: 9.8 mg/dL (ref 8.9–10.3)
Chloride: 105 mmol/L (ref 98–111)
Creatinine: 1.06 mg/dL (ref 0.61–1.24)
GFR, Estimated: >60 mL/min (ref >60)
Glucose, Bld: 93 mg/dL (ref 70–99)
Potassium: 4.4 mmol/L (ref 3.5–5.1)
Sodium: 140 mmol/L (ref 135–145)
Total Bilirubin: 1.3 mg/dL — ABNORMAL HIGH (ref 0.3–1.2)
Total Protein: 6.8 g/dL (ref 6.5–8.1)

## 2021-07-05 LAB — CBC WITH DIFFERENTIAL (CANCER CENTER ONLY)
Abs Immature Granulocytes: 0.03 10*3/uL (ref 0.00–0.07)
Basophils Absolute: 0 10*3/uL (ref 0.0–0.1)
Basophils Relative: 0 %
Eosinophils Absolute: 0 10*3/uL (ref 0.0–0.5)
Eosinophils Relative: 1 %
HCT: 44 % (ref 39.0–52.0)
Hemoglobin: 16.5 g/dL (ref 13.0–17.0)
Immature Granulocytes: 1 %
Lymphocytes Relative: 19 %
Lymphs Abs: 1 10*3/uL (ref 0.7–4.0)
MCH: 36.1 pg — ABNORMAL HIGH (ref 26.0–34.0)
MCHC: 37.5 g/dL — ABNORMAL HIGH (ref 30.0–36.0)
MCV: 96.3 fL (ref 80.0–100.0)
Monocytes Absolute: 0.3 10*3/uL (ref 0.1–1.0)
Monocytes Relative: 7 %
Neutro Abs: 3.7 10*3/uL (ref 1.7–7.7)
Neutrophils Relative %: 72 %
Platelet Count: 120 10*3/uL — ABNORMAL LOW (ref 150–400)
RBC: 4.57 MIL/uL (ref 4.22–5.81)
RDW: 12.4 % (ref 11.5–15.5)
WBC Count: 5.1 10*3/uL (ref 4.0–10.5)
nRBC: 0 % (ref 0.0–0.2)

## 2021-07-05 MED ORDER — LOMUSTINE 100 MG PO CAPS: 100.0000 mg/m2 | ORAL_CAPSULE | Freq: Once | ORAL | 0 refills | Status: AC

## 2021-07-05 NOTE — Progress Notes (Signed)
? ?[facility] at [address]  ?[place], [place] [address] ?[phone] ? ? ?Interval Evaluation ? ?Date of Service: 07/05/21 ?Patient Name: Robert Watkins ?Patient MRN: [id number] ?Patient DOB: [birth year] ?Provider: Ventura Sellers, MD ? ?Identifying Statement:  ?Robert Watkins is a 36 y.o. male with left occipital anaplastic astrocytoma  ? ?Oncologic History: ?05/12/2010 Biopsy  ?Stereotactic biopsy of left occipital lesion by Dr. Kristeen Miss in [place]. Pathology demonstrates anaplastic astrocytoma (WHO III) ? 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 ?05/26/2010 Surgery  ?Left parietooccipital craniotomy with tumor resection by Dr. [name]. Pathology confirms anaplastic astrocytoma.  ? ?06/01/2010 Clinical Event-Other  ?New patient evaluation at [facility] [place] [facility]. Recommend radiation therapy with concurrent Temozolomide followed by twelve cycles of five-day Temozolomide.  ? ?06/13/2010 - 08/01/2010 Radiation  ?Radiation with Temodar. ? ?06/13/2010 - 08/01/2010 Chemotherapy  ?Temodar 75 mg/m2 with radiation. ? ?08/16/2010 - 08/07/2011 Chemotherapy  ?5 day Temodar 200 mg/m2 ? ?02/19/2018 Progression  ?Progression of non-enhancing tumor compared to 2013. Recommend initiating metronomic daily Temodar 50 mg/m2/day. ? ?02/12/20 Progression ?Initiates Tibsovo '500mg'$  daily ? ?10/21/20 Progression ?Adds oral CCNU '90mg'$ /m2 q6 weeks to daily Tibsovo ? ?Interval History: ? [name] presents today for follow up after completing cycle #5 of oral CCNU with Tibsovo.  Continues to tolerate treatment well without issue.  No clinical changes today.  Continues to describe modest fatigue.  No headaches or seizures.  He remains active and functionally independent. Continues to work full time. ? ?Medications: ?Current Outpatient Medications on File Prior to Visit  ?Medication Sig Dispense Refill  ? GLEOSTINE 100 MG capsule TAKE  TWO CAPS BY MOUTH ONCE FOR 1 DOSE EVERY 6 WEEKS. TAKE ON AN EMPTY STOMACH 1 HR BEFORE OR 2 HOURS AFTER A MEAL. (Patient not taking: Reported on 03/24/2021) 2 capsule 0  ? GLEOSTINE 100 MG capsule Take by mouth. (Patient not taking: Reported on 03/24/2021)    ? GLEOSTINE 100 MG capsule TAKE 2 CAPSULES (200 MG TOTAL) BY MOUTH ONCE FOR ONE DOSE EVERY 42 DAY CYCLE. TAKE ON AN EMPTY STOMACH 1 HOUR BEFORE OR 2 HOURS AFTER MEALS. 2 capsule 0  ? GLEOSTINE 100 MG capsule TAKE 2 CAPSULES (200 MG TOTAL) BY MOUTH ONCE FOR ONE DOSE EVERY 42 DAY CYCLE. TAKE ON AN EMPTY STOMACH 1 HOUR BEFORE OR 2 HOURS AFTER MEALS. 2 capsule 0  ? Ivosidenib (TIBSOVO) 250 MG TABS Take 2 tablets by mouth daily. 60 tablet 0  ? levETIRAcetam (KEPPRA) 500 MG tablet Take 1 tablet by mouth 2 times daily. 180 tablet 2  ? ondansetron (ZOFRAN) 8 MG tablet Take 1 tablet by mouth 2 times daily as needed. Start on the third day after chemotherapy. 30 tablet 1  ? ?No current facility-administered medications on file prior to visit.  ? ? ?Allergies:  ?Allergies  ?Allergen Reactions  ? Lamotrigine Rash  ? ?Past Medical History:  ?Past Medical History:  ?Diagnosis Date  ? Cancer of brain ([place]) 04/07/2011  ? 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 History of chemotherapy 06/13/2010-08/01/2010  ? 42 day Temodar with concurrent Radiation  ? History of chemotherapy 08/16/2010-08/07/2011  ? 5 day Temodar 200 mg/m2  ? History of radiation therapy 06/13/2010-08/01/2010  ? [facility] [place] [facility]. Recommend radiation therapy with concurrent Temozolomide followed by twelve cycles of five-day Temozolomide. ?  ? Seizure disorder, secondary ([place]) 04/07/2011  ? ?Past Surgical History:  ?  Past Surgical History:  ?Procedure Laterality Date  ? CRANIECTOMY / CRANIOTOMY FOR EXCISION OF BRAIN TUMOR Left 05/26/2010  ? Left parietooccipital craniotomy with tumor resection by Dr. [name]. Pathology confirms anaplastic astrocytoma. ?  ? CRANIOTOMY FOR  STEREOTACTIC GUIDED SURGERY Left 05/12/2010  ? Stereotactic biopsy of left occipital lesion by Dr. Kristeen Miss in [place]. Pathology demonstrates anaplastic astrocytoma (WHO III)?  ? ?Social History:  ?Social History  ? ?Socioeconomic History  ? Marital status: Single  ?  Spouse name: Not on file  ? Number of children: Not on file  ? Years of education: Not on file  ? Highest education level: Not on file  ?Occupational History  ? Not on file  ?Tobacco Use  ? Smoking status: Former  ? Smokeless tobacco: Never  ?Substance and Sexual Activity  ? Alcohol use: Yes  ?  Alcohol/week: 4.0 standard drinks  ?  Types: 4 Shots of liquor per week  ? Drug use: Yes  ?  Types: Marijuana  ? Sexual activity: Yes  ?Other Topics Concern  ? Not on file  ?Social History Narrative  ? Not on file  ? ?Social Determinants of Health  ? ?Financial Resource Strain: Not on file  ?Food Insecurity: Not on file  ?Transportation Needs: Not on file  ?Physical Activity: Not on file  ?Stress: Not on file  ?Social Connections: Not on file  ?Intimate Partner Violence: Not on file  ? ?Family History: No family history on file. ? ?Review of Systems: ?Constitutional: Denies fevers, chills or abnormal weight loss ?Eyes: Denies blurriness of vision ?Ears, nose, mouth, throat, and face: Denies mucositis or sore throat ?Respiratory: Denies cough, dyspnea or wheezes ?Cardiovascular: Denies palpitation, chest discomfort or lower extremity swelling ?Gastrointestinal:  Denies nausea, constipation, diarrhea ?GU: Denies dysuria or incontinence ?Skin: Denies abnormal skin rashes ?Neurological: Per HPI ?Musculoskeletal: Denies joint pain, back or neck discomfort. No decrease in ROM ?Behavioral/Psych: Denies anxiety, disturbance in thought content, and mood instability ? ?Physical Exam: ?Vitals:  ? 07/05/21 1231  ?BP: (!) 118/52  ?Pulse: (!) 52  ?Temp: 98.1 ?F (36.7 ?C)  ? ?KPS: 100. ?General: Alert, cooperative, pleasant, in no acute distress ?Head: Normal ?EENT:  No conjunctival injection or scleral icterus. Oral mucosa moist ?Lungs: Resp effort normal ?Cardiac: Regular rate and rhythm ?Abdomen: Soft, non-distended abdomen ?Skin: No rashes cyanosis or petechiae. ?Extremities: No clubbing or edema ? ?Neurologic Exam: ?Mental Status: Awake, alert, attentive to examiner. Oriented to self and environment. Language is fluent with intact comprehension.  ?Cranial Nerves: Visual acuity is grossly normal. Visual fields are full. Extra-ocular movements intact. No ptosis. Face is symmetric, tongue midline. ?Motor: Tone and bulk are normal. Power is full in both arms and legs. Reflexes are symmetric, no pathologic reflexes present. Intact finger to nose bilaterally ?Sensory: Intact to light touch and temperature ?Gait: Normal and tandem gait is normal. ? ? ?Labs: ?I have reviewed the data as listed ?   ?Component Value Date/Time  ? NA 140 07/05/2021 1139  ? K 4.4 07/05/2021 1139  ? CL 105 07/05/2021 1139  ? CO2 28 07/05/2021 1139  ? GLUCOSE 93 07/05/2021 1139  ? BUN 10 07/05/2021 1139  ? CREATININE 1.06 07/05/2021 1139  ? CALCIUM 9.8 07/05/2021 1139  ? PROT 6.8 07/05/2021 1139  ? ALBUMIN 4.6 07/05/2021 1139  ? AST 26 07/05/2021 1139  ? ALT 37 07/05/2021 1139  ? ALKPHOS 74 07/05/2021 1139  ? BILITOT 1.3 (H) 07/05/2021 1139  ? GFRNONAA >60 07/05/2021 1139  ?  GFRAA >60 08/19/2018 0854  ? ?Lab Results  ?Component Value Date  ? WBC 5.1 07/05/2021  ? NEUTROABS 3.7 07/05/2021  ? HGB 16.5 07/05/2021  ? HCT 44.0 07/05/2021  ? MCV 96.3 07/05/2021  ? PLT 120 (L) 07/05/2021  ? ? ?Assessment/Plan ?1. Anaplastic astrocytoma ([place]) ? ?Mr. Eslick is clinically stable today, now having completed cycle #5 of CCNU and Tibsovo.  No new or progressive changes. ? ?Cycle #6 CCNU will be administered orally again at dose level '110mg'$ /m2 every 6 weeks.  Dose reduction may have to be considered for future cycles, if indicated, based on blood counts.  The patient will have a complete blood count and CMP performed  on days 14, 28 and 42 of each cycle. Labs may need to be performed more often. Zofran will prescribed for home use for nausea/vomiting.  ? ?Will continue dosing Ivosidenib '500mg'$  daily given good control of prev

## 2021-07-06 ENCOUNTER — Telehealth: Payer: Self-pay | Admitting: Internal Medicine

## 2021-07-06 NOTE — Telephone Encounter (Signed)
Scheduled per 3/14 los, message has been left with pt ?

## 2021-07-07 ENCOUNTER — Other Ambulatory Visit (HOSPITAL_COMMUNITY): Payer: Self-pay

## 2021-07-12 ENCOUNTER — Other Ambulatory Visit: Payer: Self-pay | Admitting: Radiation Therapy

## 2021-07-18 ENCOUNTER — Inpatient Hospital Stay: Payer: Managed Care, Other (non HMO)

## 2021-07-18 ENCOUNTER — Telehealth: Payer: Self-pay | Admitting: *Deleted

## 2021-07-18 ENCOUNTER — Other Ambulatory Visit: Payer: Self-pay

## 2021-07-18 DIAGNOSIS — C714 Malignant neoplasm of occipital lobe: Secondary | ICD-10-CM | POA: Diagnosis not present

## 2021-07-18 DIAGNOSIS — C719 Malignant neoplasm of brain, unspecified: Secondary | ICD-10-CM

## 2021-07-18 LAB — CMP (CANCER CENTER ONLY)
ALT: 37 U/L (ref 0–44)
AST: 24 U/L (ref 15–41)
Albumin: 4.3 g/dL (ref 3.5–5.0)
Alkaline Phosphatase: 70 U/L (ref 38–126)
Anion gap: 5 (ref 5–15)
BUN: 13 mg/dL (ref 6–20)
CO2: 27 mmol/L (ref 22–32)
Calcium: 9.1 mg/dL (ref 8.9–10.3)
Chloride: 107 mmol/L (ref 98–111)
Creatinine: 1.09 mg/dL (ref 0.61–1.24)
GFR, Estimated: >60 mL/min (ref >60)
Glucose, Bld: 95 mg/dL (ref 70–99)
Potassium: 4.2 mmol/L (ref 3.5–5.1)
Sodium: 139 mmol/L (ref 135–145)
Total Bilirubin: 0.9 mg/dL (ref 0.3–1.2)
Total Protein: 6.6 g/dL (ref 6.5–8.1)

## 2021-07-18 LAB — CBC WITH DIFFERENTIAL (CANCER CENTER ONLY)
Abs Immature Granulocytes: 0.02 10*3/uL (ref 0.00–0.07)
Basophils Absolute: 0 10*3/uL (ref 0.0–0.1)
Basophils Relative: 1 %
Eosinophils Absolute: 0 10*3/uL (ref 0.0–0.5)
Eosinophils Relative: 1 %
Hemoglobin: 15.5 g/dL (ref 13.0–17.0)
Immature Granulocytes: 1 %
Lymphocytes Relative: 19 %
Lymphs Abs: 0.7 10*3/uL (ref 0.7–4.0)
Monocytes Absolute: 0.2 10*3/uL (ref 0.1–1.0)
Monocytes Relative: 5 %
Neutro Abs: 3 10*3/uL (ref 1.7–7.7)
Neutrophils Relative %: 73 %
Platelet Count: 116 10*3/uL — ABNORMAL LOW (ref 150–400)
WBC Count: 4 10*3/uL (ref 4.0–10.5)

## 2021-07-18 NOTE — Telephone Encounter (Signed)
Received request for refill of Tibsovo from [facility] [place]. ? ?Routed to Dr Mickeal Skinner ?

## 2021-07-26 ENCOUNTER — Other Ambulatory Visit: Payer: Managed Care, Other (non HMO)

## 2021-08-01 ENCOUNTER — Other Ambulatory Visit: Payer: Self-pay

## 2021-08-01 ENCOUNTER — Inpatient Hospital Stay: Payer: Managed Care, Other (non HMO) | Attending: Internal Medicine

## 2021-08-01 DIAGNOSIS — G40909 Epilepsy, unspecified, not intractable, without status epilepticus: Secondary | ICD-10-CM | POA: Insufficient documentation

## 2021-08-01 DIAGNOSIS — R5383 Other fatigue: Secondary | ICD-10-CM | POA: Diagnosis not present

## 2021-08-01 DIAGNOSIS — Z9221 Personal history of antineoplastic chemotherapy: Secondary | ICD-10-CM | POA: Diagnosis not present

## 2021-08-01 DIAGNOSIS — C719 Malignant neoplasm of brain, unspecified: Secondary | ICD-10-CM

## 2021-08-01 DIAGNOSIS — R112 Nausea with vomiting, unspecified: Secondary | ICD-10-CM | POA: Insufficient documentation

## 2021-08-01 DIAGNOSIS — C714 Malignant neoplasm of occipital lobe: Secondary | ICD-10-CM | POA: Diagnosis present

## 2021-08-01 DIAGNOSIS — D696 Thrombocytopenia, unspecified: Secondary | ICD-10-CM | POA: Insufficient documentation

## 2021-08-01 DIAGNOSIS — Z79899 Other long term (current) drug therapy: Secondary | ICD-10-CM | POA: Diagnosis not present

## 2021-08-01 DIAGNOSIS — Z923 Personal history of irradiation: Secondary | ICD-10-CM | POA: Diagnosis not present

## 2021-08-01 LAB — CMP (CANCER CENTER ONLY)
ALT: 43 U/L (ref 0–44)
AST: 23 U/L (ref 15–41)
Albumin: 4.5 g/dL (ref 3.5–5.0)
Alkaline Phosphatase: 69 U/L (ref 38–126)
Anion gap: 4 — ABNORMAL LOW (ref 5–15)
BUN: 13 mg/dL (ref 6–20)
CO2: 30 mmol/L (ref 22–32)
Calcium: 9.5 mg/dL (ref 8.9–10.3)
Chloride: 106 mmol/L (ref 98–111)
Creatinine: 1.08 mg/dL (ref 0.61–1.24)
GFR, Estimated: >60 mL/min (ref >60)
Glucose, Bld: 101 mg/dL — ABNORMAL HIGH (ref 70–99)
Potassium: 4.7 mmol/L (ref 3.5–5.1)
Sodium: 140 mmol/L (ref 135–145)
Total Bilirubin: 0.9 mg/dL (ref 0.3–1.2)
Total Protein: 6.9 g/dL (ref 6.5–8.1)

## 2021-08-01 LAB — CBC WITH DIFFERENTIAL (CANCER CENTER ONLY)
Abs Immature Granulocytes: 0.01 10*3/uL (ref 0.00–0.07)
Basophils Absolute: 0 10*3/uL (ref 0.0–0.1)
Basophils Relative: 0 %
Eosinophils Absolute: 0 10*3/uL (ref 0.0–0.5)
Eosinophils Relative: 1 %
Hemoglobin: 15.4 g/dL (ref 13.0–17.0)
Immature Granulocytes: 0 %
Lymphocytes Relative: 18 %
Lymphs Abs: 0.7 10*3/uL (ref 0.7–4.0)
Monocytes Absolute: 0.3 10*3/uL (ref 0.1–1.0)
Monocytes Relative: 6 %
Neutro Abs: 3 10*3/uL (ref 1.7–7.7)
Neutrophils Relative %: 75 %
Platelet Count: 144 10*3/uL — ABNORMAL LOW (ref 150–400)
WBC Count: 4 10*3/uL (ref 4.0–10.5)

## 2021-08-06 ENCOUNTER — Other Ambulatory Visit (HOSPITAL_COMMUNITY): Payer: Self-pay

## 2021-08-12 ENCOUNTER — Ambulatory Visit
Admission: RE | Admit: 2021-08-12 | Discharge: 2021-08-12 | Disposition: A | Discharge status: Home / Self Care | Payer: Managed Care, Other (non HMO) | Source: Ambulatory Visit | Attending: Internal Medicine | Admitting: Internal Medicine

## 2021-08-12 DIAGNOSIS — C719 Malignant neoplasm of brain, unspecified: Secondary | ICD-10-CM

## 2021-08-12 MED ORDER — GADOBENATE DIMEGLUMINE 529 MG/ML IV SOLN
20.0000 mL | Freq: Once | INTRAVENOUS | Status: AC | PRN
  Administered 2021-08-12: 20 mL via INTRAVENOUS

## 2021-08-15 ENCOUNTER — Inpatient Hospital Stay: Payer: Managed Care, Other (non HMO)

## 2021-08-16 ENCOUNTER — Other Ambulatory Visit: Payer: Self-pay

## 2021-08-16 ENCOUNTER — Inpatient Hospital Stay (HOSPITAL_BASED_OUTPATIENT_CLINIC_OR_DEPARTMENT_OTHER): Payer: Managed Care, Other (non HMO) | Admitting: Internal Medicine

## 2021-08-16 ENCOUNTER — Other Ambulatory Visit: Payer: Self-pay | Admitting: Internal Medicine

## 2021-08-16 ENCOUNTER — Inpatient Hospital Stay: Payer: Managed Care, Other (non HMO)

## 2021-08-16 VITALS — BP 135/75 | HR 60 | Temp 98.5°F | Resp 15 | Wt 200.6 lb

## 2021-08-16 DIAGNOSIS — C719 Malignant neoplasm of brain, unspecified: Secondary | ICD-10-CM

## 2021-08-16 DIAGNOSIS — C714 Malignant neoplasm of occipital lobe: Secondary | ICD-10-CM | POA: Diagnosis not present

## 2021-08-16 DIAGNOSIS — G40909 Epilepsy, unspecified, not intractable, without status epilepticus: Secondary | ICD-10-CM

## 2021-08-16 LAB — CMP (CANCER CENTER ONLY)
ALT: 36 U/L (ref 0–44)
AST: 25 U/L (ref 15–41)
Albumin: 4.6 g/dL (ref 3.5–5.0)
Alkaline Phosphatase: 68 U/L (ref 38–126)
Anion gap: 7 (ref 5–15)
BUN: 18 mg/dL (ref 6–20)
CO2: 28 mmol/L (ref 22–32)
Calcium: 9.4 mg/dL (ref 8.9–10.3)
Chloride: 106 mmol/L (ref 98–111)
Creatinine: 1.05 mg/dL (ref 0.61–1.24)
GFR, Estimated: >60 mL/min (ref >60)
Glucose, Bld: 96 mg/dL (ref 70–99)
Potassium: 4.6 mmol/L (ref 3.5–5.1)
Sodium: 141 mmol/L (ref 135–145)
Total Bilirubin: 1.1 mg/dL (ref 0.3–1.2)
Total Protein: 6.8 g/dL (ref 6.5–8.1)

## 2021-08-16 LAB — CBC WITH DIFFERENTIAL (CANCER CENTER ONLY)
Abs Immature Granulocytes: 0.01 10*3/uL (ref 0.00–0.07)
Basophils Absolute: 0 10*3/uL (ref 0.0–0.1)
Basophils Relative: 1 %
Eosinophils Absolute: 0 10*3/uL (ref 0.0–0.5)
Eosinophils Relative: 1 %
Hemoglobin: 15.5 g/dL (ref 13.0–17.0)
Immature Granulocytes: 0 %
Lymphocytes Relative: 20 %
Lymphs Abs: 0.9 10*3/uL (ref 0.7–4.0)
Monocytes Absolute: 0.3 10*3/uL (ref 0.1–1.0)
Monocytes Relative: 6 %
Neutro Abs: 3.2 10*3/uL (ref 1.7–7.7)
Neutrophils Relative %: 72 %
Platelet Count: 117 10*3/uL — ABNORMAL LOW (ref 150–400)
WBC Count: 4.4 10*3/uL (ref 4.0–10.5)

## 2021-08-16 MED ORDER — LEVETIRACETAM 500 MG PO TABS: 500.0000 mg | ORAL_TABLET | Freq: Two times a day (BID) | ORAL | 2 refills | Status: DC

## 2021-08-16 NOTE — Progress Notes (Signed)
? ?[facility] at [address]  ?[place], [place] [address] ?[phone] ? ? ?Interval Evaluation ? ?Date of Service: 08/16/21 ?Patient Name: Robert Watkins ?Patient MRN: [id number] ?Patient DOB: 1985/06/24 ?Provider: Ventura Sellers, MD ? ?Identifying Statement:  ?Robert Watkins is a 36 y.o. male with left occipital anaplastic astrocytoma  ? ?Oncologic History: ?05/12/2010 Biopsy  ?Stereotactic biopsy of left occipital lesion by Dr. Kristeen Miss in [place]. Pathology demonstrates anaplastic astrocytoma (WHO III) ? 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 ?05/26/2010 Surgery  ?Left parietooccipital craniotomy with tumor resection by Dr. [name]. Pathology confirms anaplastic astrocytoma.  ? ?06/01/2010 Clinical Event-Other  ?New patient evaluation at [facility] [place] [facility]. Recommend radiation therapy with concurrent Temozolomide followed by twelve cycles of five-day Temozolomide.  ? ?06/13/2010 - 08/01/2010 Radiation  ?Radiation with Temodar. ? ?06/13/2010 - 08/01/2010 Chemotherapy  ?Temodar 75 mg/m2 with radiation. ? ?08/16/2010 - 08/07/2011 Chemotherapy  ?5 day Temodar 200 mg/m2 ? ?02/19/2018 Progression  ?Progression of non-enhancing tumor compared to 2013. Recommend initiating metronomic daily Temodar 50 mg/m2/day. ? ?02/12/20 Progression ?Initiates Tibsovo '500mg'$  daily ? ?10/21/20 Progression ?Adds oral CCNU '90mg'$ /m2 q6 weeks to daily Tibsovo ? ?Interval History: ? Robert Watkins presents today for follow up after completing cycle #6 of oral CCNU with Tibsovo.  Continues to tolerate treatment well without issue.  Denies new or progressive symptmos.  Continues to describe modest fatigue.  No headaches or seizures.  He remains active and functionally independent. Continues to work full time. ? ?Medications: ?Current Outpatient Medications on File Prior to Visit  ?Medication Sig Dispense Refill  ? GLEOSTINE 100 MG  capsule TAKE 2 CAPSULES (200 MG TOTAL) BY MOUTH ONCE FOR ONE DOSE EVERY 42 DAY CYCLE. TAKE ON AN EMPTY STOMACH 1 HOUR BEFORE OR 2 HOURS AFTER MEALS. (Patient not taking: Reported on 07/05/2021) 2 capsule 0  ? GLEOSTINE 100 MG capsule TAKE 2 CAPSULES (200 MG TOTAL) BY MOUTH ONCE FOR ONE DOSE EVERY 42 DAY CYCLE. TAKE ON AN EMPTY STOMACH 1 HOUR BEFORE OR 2 HOURS AFTER MEALS. (Patient not taking: Reported on 07/05/2021) 2 capsule 0  ? Ivosidenib (TIBSOVO) 250 MG TABS Take 2 tablets by mouth daily. 60 tablet 0  ? levETIRAcetam (KEPPRA) 500 MG tablet Take 1 tablet by mouth 2 times daily. 180 tablet 2  ? ondansetron (ZOFRAN) 8 MG tablet Take 1 tablet by mouth 2 times daily as needed. Start on the third day after chemotherapy. 30 tablet 1  ? ?No current facility-administered medications on file prior to visit.  ? ? ?Allergies:  ?Allergies  ?Allergen Reactions  ? Lamotrigine Rash  ? ?Past Medical History:  ?Past Medical History:  ?Diagnosis Date  ? Cancer of brain ([place]) 04/07/2011  ? 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 History of chemotherapy 06/13/2010-08/01/2010  ? 42 day Temodar with concurrent Radiation  ? History of chemotherapy 08/16/2010-08/07/2011  ? 5 day Temodar 200 mg/m2  ? History of radiation therapy 06/13/2010-08/01/2010  ? [facility] [place] [facility]. Recommend radiation therapy with concurrent Temozolomide followed by twelve cycles of five-day Temozolomide. ?  ? Seizure disorder, secondary ([place]) 04/07/2011  ? ?Past Surgical History:  ?Past Surgical History:  ?Procedure Laterality Date  ? CRANIECTOMY / CRANIOTOMY FOR EXCISION OF BRAIN TUMOR Left 05/26/2010  ? Left parietooccipital craniotomy with tumor resection by Dr. [name]. Pathology confirms anaplastic astrocytoma. ?  ? CRANIOTOMY FOR STEREOTACTIC GUIDED SURGERY Left  05/12/2010  ? Stereotactic biopsy of left occipital lesion by Dr. Kristeen Miss in [place]. Pathology demonstrates anaplastic astrocytoma (WHO III)?  ? ?Social  History:  ?Social History  ? ?Socioeconomic History  ? Marital status: Single  ?  Spouse name: Not on file  ? Number of children: Not on file  ? Years of education: Not on file  ? Highest education level: Not on file  ?Occupational History  ? Not on file  ?Tobacco Use  ? Smoking status: Former  ? Smokeless tobacco: Never  ?Substance and Sexual Activity  ? Alcohol use: Yes  ?  Alcohol/week: 4.0 standard drinks  ?  Types: 4 Shots of liquor per week  ? Drug use: Yes  ?  Types: Marijuana  ? Sexual activity: Yes  ?Other Topics Concern  ? Not on file  ?Social History Narrative  ? Not on file  ? ?Social Determinants of Health  ? ?Financial Resource Strain: Not on file  ?Food Insecurity: Not on file  ?Transportation Needs: Not on file  ?Physical Activity: Not on file  ?Stress: Not on file  ?Social Connections: Not on file  ?Intimate Partner Violence: Not on file  ? ?Family History: No family history on file. ? ?Review of Systems: ?Constitutional: Denies fevers, chills or abnormal weight loss ?Eyes: Denies blurriness of vision ?Ears, nose, mouth, throat, and face: Denies mucositis or sore throat ?Respiratory: Denies cough, dyspnea or wheezes ?Cardiovascular: Denies palpitation, chest discomfort or lower extremity swelling ?Gastrointestinal:  Denies nausea, constipation, diarrhea ?GU: Denies dysuria or incontinence ?Skin: Denies abnormal skin rashes ?Neurological: Per HPI ?Musculoskeletal: Denies joint pain, back or neck discomfort. No decrease in ROM ?Behavioral/Psych: Denies anxiety, disturbance in thought content, and mood instability ? ?Physical Exam: ?Vitals:  ? 08/16/21 1113  ?BP: 135/75  ?Pulse: 60  ?Resp: 15  ?Temp: 98.5 ?F (36.9 ?C)  ?SpO2: 100%  ? ? ?KPS: 100. ?General: Alert, cooperative, pleasant, in no acute distress ?Head: Normal ?EENT: No conjunctival injection or scleral icterus. Oral mucosa moist ?Lungs: Resp effort normal ?Cardiac: Regular rate and rhythm ?Abdomen: Soft, non-distended abdomen ?Skin: No  rashes cyanosis or petechiae. ?Extremities: No clubbing or edema ? ?Neurologic Exam: ?Mental Status: Awake, alert, attentive to examiner. Oriented to self and environment. Language is fluent with intact comprehension.  ?Cranial Nerves: Visual acuity is grossly normal. Visual fields are full. Extra-ocular movements intact. No ptosis. Face is symmetric, tongue midline. ?Motor: Tone and bulk are normal. Power is full in both arms and legs. Reflexes are symmetric, no pathologic reflexes present. Intact finger to nose bilaterally ?Sensory: Intact to light touch and temperature ?Gait: Normal and tandem gait is normal. ? ? ?Labs: ?I have reviewed the data as listed ?   ?Component Value Date/Time  ? NA 140 08/01/2021 1138  ? K 4.7 08/01/2021 1138  ? CL 106 08/01/2021 1138  ? CO2 30 08/01/2021 1138  ? GLUCOSE 101 (H) 08/01/2021 1138  ? BUN 13 08/01/2021 1138  ? CREATININE 1.08 08/01/2021 1138  ? CALCIUM 9.5 08/01/2021 1138  ? PROT 6.9 08/01/2021 1138  ? ALBUMIN 4.5 08/01/2021 1138  ? AST 23 08/01/2021 1138  ? ALT 43 08/01/2021 1138  ? ALKPHOS 69 08/01/2021 1138  ? BILITOT 0.9 08/01/2021 1138  ? GFRNONAA >60 08/01/2021 1138  ? GFRAA >60 08/19/2018 0854  ? ?Lab Results  ?Component Value Date  ? WBC 4.4 08/16/2021  ? NEUTROABS 3.2 08/16/2021  ? HGB 15.5 08/16/2021  ? HCT RESULTS UNAVAILABLE DUE TO INTERFERING SUBSTANCE 08/16/2021  ?  MCV RESULTS UNAVAILABLE DUE TO INTERFERING SUBSTANCE 08/16/2021  ? PLT 117 (L) 08/16/2021  ? ? ?Imaging: ? ?[place] Clinician Interpretation: I have personally reviewed the CNS images as listed.  My interpretation, in the context of the patient's clinical presentation, is stable disease ? ?MR BRAIN W WO CONTRAST ? ?Result Date: 08/12/2021 ?CLINICAL DATA:  Brain/CNS neoplasm, assess treatment response; anaplastic astrocytoma, on chemotherapy EXAM: MRI HEAD WITHOUT AND WITH CONTRAST TECHNIQUE: Multiplanar, multiecho pulse sequences of the brain and surrounding structures were obtained without and with  intravenous contrast. CONTRAST:  58m MULTIHANCE GADOBENATE DIMEGLUMINE 529 MG/ML IV SOLN COMPARISON:  05/20/2021 FINDINGS: Brain: Postoperative changes in the left occipital lobe are again identified. There is no

## 2021-08-17 ENCOUNTER — Telehealth: Payer: Self-pay | Admitting: Internal Medicine

## 2021-08-17 NOTE — Telephone Encounter (Signed)
Scheduled per 4/25 los, message has been left with pt ?

## 2021-08-18 ENCOUNTER — Other Ambulatory Visit: Payer: Self-pay

## 2021-08-18 DIAGNOSIS — C719 Malignant neoplasm of brain, unspecified: Secondary | ICD-10-CM

## 2021-08-18 MED ORDER — TIBSOVO 250 MG PO TABS: 2.0000 | ORAL_TABLET | Freq: Every day | ORAL | 0 refills | Status: DC

## 2021-09-06 ENCOUNTER — Inpatient Hospital Stay: Payer: Managed Care, Other (non HMO) | Attending: Internal Medicine

## 2021-09-06 ENCOUNTER — Other Ambulatory Visit: Payer: Self-pay

## 2021-09-06 ENCOUNTER — Other Ambulatory Visit: Payer: Self-pay | Admitting: *Deleted

## 2021-09-06 DIAGNOSIS — C719 Malignant neoplasm of brain, unspecified: Secondary | ICD-10-CM

## 2021-09-06 DIAGNOSIS — C714 Malignant neoplasm of occipital lobe: Secondary | ICD-10-CM | POA: Insufficient documentation

## 2021-09-06 LAB — CBC WITH DIFFERENTIAL (CANCER CENTER ONLY)
Abs Immature Granulocytes: 0.02 10*3/uL (ref 0.00–0.07)
Basophils Absolute: 0 10*3/uL (ref 0.0–0.1)
Basophils Relative: 0 %
Eosinophils Absolute: 0 10*3/uL (ref 0.0–0.5)
Eosinophils Relative: 1 %
Hemoglobin: 15.3 g/dL (ref 13.0–17.0)
Immature Granulocytes: 1 %
Lymphocytes Relative: 17 %
Lymphs Abs: 0.7 10*3/uL (ref 0.7–4.0)
Monocytes Absolute: 0.2 10*3/uL (ref 0.1–1.0)
Monocytes Relative: 6 %
Neutro Abs: 3.2 10*3/uL (ref 1.7–7.7)
Neutrophils Relative %: 75 %
Platelet Count: 123 10*3/uL — ABNORMAL LOW (ref 150–400)
WBC Count: 4.2 10*3/uL (ref 4.0–10.5)

## 2021-09-06 LAB — CMP (CANCER CENTER ONLY)
ALT: 69 U/L — ABNORMAL HIGH (ref 0–44)
AST: 54 U/L — ABNORMAL HIGH (ref 15–41)
Albumin: 4.5 g/dL (ref 3.5–5.0)
Alkaline Phosphatase: 66 U/L (ref 38–126)
Anion gap: 5 (ref 5–15)
BUN: 12 mg/dL (ref 6–20)
CO2: 29 mmol/L (ref 22–32)
Calcium: 9.5 mg/dL (ref 8.9–10.3)
Chloride: 107 mmol/L (ref 98–111)
Creatinine: 1.12 mg/dL (ref 0.61–1.24)
GFR, Estimated: >60 mL/min (ref >60)
Glucose, Bld: 102 mg/dL — ABNORMAL HIGH (ref 70–99)
Potassium: 4.6 mmol/L (ref 3.5–5.1)
Sodium: 141 mmol/L (ref 135–145)
Total Bilirubin: 0.8 mg/dL (ref 0.3–1.2)
Total Protein: 6.7 g/dL (ref 6.5–8.1)

## 2021-09-21 ENCOUNTER — Other Ambulatory Visit: Payer: Self-pay | Admitting: Internal Medicine

## 2021-09-22 ENCOUNTER — Encounter: Payer: Self-pay | Admitting: Internal Medicine

## 2021-09-26 ENCOUNTER — Other Ambulatory Visit: Payer: Self-pay | Admitting: Internal Medicine

## 2021-09-26 DIAGNOSIS — C719 Malignant neoplasm of brain, unspecified: Secondary | ICD-10-CM

## 2021-09-27 ENCOUNTER — Inpatient Hospital Stay: Payer: Managed Care, Other (non HMO) | Attending: Internal Medicine

## 2021-09-27 ENCOUNTER — Inpatient Hospital Stay (HOSPITAL_BASED_OUTPATIENT_CLINIC_OR_DEPARTMENT_OTHER): Payer: Managed Care, Other (non HMO) | Admitting: Internal Medicine

## 2021-09-27 ENCOUNTER — Other Ambulatory Visit: Payer: Self-pay

## 2021-09-27 VITALS — BP 138/77 | HR 57 | Temp 97.5°F | Resp 18 | Ht 71.0 in | Wt 195.9 lb

## 2021-09-27 DIAGNOSIS — Z79899 Other long term (current) drug therapy: Secondary | ICD-10-CM | POA: Diagnosis not present

## 2021-09-27 DIAGNOSIS — Z9221 Personal history of antineoplastic chemotherapy: Secondary | ICD-10-CM | POA: Insufficient documentation

## 2021-09-27 DIAGNOSIS — C719 Malignant neoplasm of brain, unspecified: Secondary | ICD-10-CM

## 2021-09-27 DIAGNOSIS — G40909 Epilepsy, unspecified, not intractable, without status epilepticus: Secondary | ICD-10-CM

## 2021-09-27 DIAGNOSIS — R5383 Other fatigue: Secondary | ICD-10-CM | POA: Insufficient documentation

## 2021-09-27 DIAGNOSIS — R112 Nausea with vomiting, unspecified: Secondary | ICD-10-CM | POA: Insufficient documentation

## 2021-09-27 DIAGNOSIS — D696 Thrombocytopenia, unspecified: Secondary | ICD-10-CM | POA: Diagnosis not present

## 2021-09-27 DIAGNOSIS — Z923 Personal history of irradiation: Secondary | ICD-10-CM | POA: Insufficient documentation

## 2021-09-27 DIAGNOSIS — C714 Malignant neoplasm of occipital lobe: Secondary | ICD-10-CM | POA: Diagnosis present

## 2021-09-27 LAB — CBC WITH DIFFERENTIAL (CANCER CENTER ONLY)
Abs Immature Granulocytes: 0.02 10*3/uL (ref 0.00–0.07)
Basophils Absolute: 0 10*3/uL (ref 0.0–0.1)
Basophils Relative: 1 %
Eosinophils Absolute: 0 10*3/uL (ref 0.0–0.5)
Eosinophils Relative: 1 %
Hemoglobin: 14.5 g/dL (ref 13.0–17.0)
Immature Granulocytes: 1 %
Lymphocytes Relative: 18 %
Lymphs Abs: 0.7 10*3/uL (ref 0.7–4.0)
Monocytes Absolute: 0.2 10*3/uL (ref 0.1–1.0)
Monocytes Relative: 6 %
Neutro Abs: 3.1 10*3/uL (ref 1.7–7.7)
Neutrophils Relative %: 73 %
Platelet Count: 107 10*3/uL — ABNORMAL LOW (ref 150–400)
WBC Count: 4.2 10*3/uL (ref 4.0–10.5)

## 2021-09-27 LAB — CMP (CANCER CENTER ONLY)
ALT: 35 U/L (ref 0–44)
AST: 29 U/L (ref 15–41)
Albumin: 4.3 g/dL (ref 3.5–5.0)
Alkaline Phosphatase: 64 U/L (ref 38–126)
Anion gap: 5 (ref 5–15)
BUN: 16 mg/dL (ref 6–20)
CO2: 27 mmol/L (ref 22–32)
Calcium: 9.4 mg/dL (ref 8.9–10.3)
Chloride: 107 mmol/L (ref 98–111)
Creatinine: 1.16 mg/dL (ref 0.61–1.24)
GFR, Estimated: >60 mL/min (ref >60)
Glucose, Bld: 98 mg/dL (ref 70–99)
Potassium: 4.2 mmol/L (ref 3.5–5.1)
Sodium: 139 mmol/L (ref 135–145)
Total Bilirubin: 0.8 mg/dL (ref 0.3–1.2)
Total Protein: 6.6 g/dL (ref 6.5–8.1)

## 2021-09-27 MED ORDER — LOMUSTINE 100 MG PO CAPS: 100.0000 mg/m2 | ORAL_CAPSULE | Freq: Once | ORAL | 0 refills | Status: AC

## 2021-09-27 NOTE — Progress Notes (Signed)
[place] [facility] [place], [place] [address] [phone]   Interval Evaluation  Date of Service: 09/27/21 Patient Name: Robert Watkins Patient MRN: [id number] Patient DOB: [birth year] Provider: Ventura Sellers, MD  Identifying Statement:  Robert Watkins is a 36 y.o. 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 Kristeen Miss in [place]. 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 [name]. Pathology confirms anaplastic astrocytoma.   06/01/2010 Clinical Event-Other  New patient evaluation at [facility]. Recommend radiation therapy with concurrent Temozolomide followed by twelve cycles of five-day Temozolomide.   06/13/2010 - 08/01/2010 Radiation  Radiation with Temodar.  06/13/2010 - 08/01/2010 Chemotherapy  Temodar 75 mg/m2 with radiation.  08/16/2010 - 08/07/2011 Chemotherapy  5 day Temodar 200 mg/m2  02/19/2018 Progression  Progression of non-enhancing tumor compared to 2013. Recommend initiating metronomic daily Temodar 50 mg/m2/day.  02/12/20 Progression Initiates Tibsovo '500mg'$  daily  10/21/20 Progression Adds oral CCNU '90mg'$ /m2 q6 weeks to daily Tibsovo  Interval History:  Robert Watkins presents today for follow up after completing cycle #7 of oral CCNU with Tibsovo.  Continues to tolerate treatment well without issue. No new or progressive changes today.  Continues to describe modest fatigue.  No headaches or seizures.  He remains active and functionally independent. Continues to work full time.  Medications: Current Outpatient Medications on File Prior to Visit  Medication Sig Dispense Refill   GLEOSTINE 100 MG  capsule TAKE 2 CAPSULES (200 MG) BY MOUTH ONCE FOR 1 DOSE EVERY 42 DAY CYCLE. TAKE ON AN EMPTY STOMACH 1 HOUR BEFORE OF 2 HOURS AFTER MEALS. 2 capsule 0   GLEOSTINE 100 MG capsule TAKE 2 CAPSULES (200 MG) BY MOUTH ONCE FOR 1 DOSE EVERY 42 DAY CYCLE. TAKE ON AN EMPTY STOMACH 1 HOUR BEFORE OF 2 HOURS AFTER MEALS. 2 capsule 0   Ivosidenib (TIBSOVO) 250 MG TABS Take 2 tablets by mouth daily. 60 tablet 0   levETIRAcetam (KEPPRA) 500 MG tablet Take 1 tablet by mouth 2 times daily. 180 tablet 2   ondansetron (ZOFRAN) 8 MG tablet Take 1 tablet by mouth 2 times daily as needed. Start on the third day after chemotherapy. 30 tablet 1   GLEOSTINE 100 MG capsule TAKE 2 CAPSULES (200 MG TOTAL) BY MOUTH ONCE FOR ONE DOSE EVERY 42 DAY CYCLE. TAKE ON AN EMPTY STOMACH 1 HOUR BEFORE OR 2 HOURS AFTER MEALS. (Patient not taking: Reported on 07/05/2021) 2 capsule 0   GLEOSTINE 100 MG capsule TAKE 2 CAPSULES (200 MG TOTAL) BY MOUTH ONCE FOR ONE DOSE EVERY 42 DAY CYCLE. TAKE ON AN EMPTY STOMACH 1 HOUR BEFORE OR 2 HOURS AFTER MEALS. (Patient not taking: Reported on 07/05/2021) 2 capsule 0   No current facility-administered medications on file prior to visit.    Allergies:  Allergies  Allergen Reactions   Lamotrigine Rash   Past Medical History:  Past Medical History:  Diagnosis Date   Cancer of brain ([place]) 04/07/2011   Depression, reactive 04/07/2011   History of chemotherapy 06/13/2010-08/01/2010   42 day Temodar with concurrent Radiation   History of chemotherapy 08/16/2010-08/07/2011   5 day Temodar 200 mg/m2   History of radiation therapy 06/13/2010-08/01/2010   [facility]. Recommend radiation therapy with concurrent Temozolomide followed by twelve cycles of five-day Temozolomide. ?   Seizure disorder, secondary ([place]) 04/07/2011   Past Surgical History:  Past Surgical History:  Procedure Laterality Date   CRANIECTOMY / CRANIOTOMY FOR EXCISION OF BRAIN TUMOR Left  05/26/2010   Left parietooccipital craniotomy with tumor resection by Dr. [name]. Pathology confirms anaplastic astrocytoma. ?   CRANIOTOMY FOR STEREOTACTIC GUIDED SURGERY Left 05/12/2010   Stereotactic biopsy of left occipital lesion by Dr. Kristeen Miss in [place]. Pathology demonstrates anaplastic astrocytoma (WHO III)?   Social History:  Social History   Socioeconomic History   Marital status: Single    Spouse name: Not on file   Number of children: Not on file   Years of education: Not on file   Highest education level: Not on file  Occupational History   Not on file  Tobacco Use   Smoking status: Former   Smokeless tobacco: Never  Substance and Sexual Activity   Alcohol use: Yes    Alcohol/week: 4.0 standard drinks    Types: 4 Shots of liquor per week   Drug use: Yes    Types: Marijuana   Sexual activity: Yes  Other Topics Concern   Not on file  Social History Narrative   Not on file   Social Determinants of Health   Financial Resource Strain: Not on file  Food Insecurity: Not on file  Transportation Needs: Not on file  Physical Activity: Not on file  Stress: Not on file  Social Connections: Not on file  Intimate Partner Violence: Not on file   Family History: No family history on file.  Review of Systems: Constitutional: Denies fevers, chills or abnormal weight loss Eyes: Denies blurriness of vision Ears, nose, mouth, throat, and face: Denies mucositis or sore throat Respiratory: Denies cough, dyspnea or wheezes Cardiovascular: Denies palpitation, chest discomfort or lower extremity swelling Gastrointestinal:  Denies nausea, constipation, diarrhea GU: Denies dysuria or incontinence Skin: Denies abnormal skin rashes Neurological: Per HPI Musculoskeletal: Denies joint pain, back or neck discomfort. No decrease in ROM Behavioral/Psych: Denies anxiety, disturbance in thought content, and mood instability  Physical Exam: Vitals:   09/27/21 1048   BP: 138/77  Pulse: (!) 57  Resp: 18  Temp: (!) 97.5 F (36.4 C)  SpO2: 100%    KPS: 100. General: Alert, cooperative, pleasant, in no acute distress Head: Normal EENT: No conjunctival injection or scleral icterus. Oral mucosa moist Lungs: Resp effort normal Cardiac: Regular rate and rhythm Abdomen: Soft, non-distended abdomen Skin: No rashes cyanosis or petechiae. Extremities: No clubbing or edema  Neurologic Exam: Mental Status: Awake, alert, attentive to examiner. Oriented to self and environment. Language is fluent with intact comprehension.  Cranial Nerves: Visual acuity is grossly normal. Visual fields are full. Extra-ocular movements intact. No ptosis. Face is symmetric, tongue midline. Motor: Tone and bulk are normal. Power is full in both arms and legs. Reflexes are symmetric, no pathologic reflexes present. Intact finger to nose bilaterally Sensory: Intact to light touch and temperature Gait: Normal and tandem gait is normal.   Labs: I have reviewed the data as listed    Component Value Date/Time   NA 141 09/06/2021 1044   K 4.6 09/06/2021 1044   CL 107 09/06/2021 1044   CO2 29 09/06/2021 1044   GLUCOSE 102 (H) 09/06/2021 1044   BUN 12 09/06/2021 1044   CREATININE 1.12 09/06/2021 1044   CALCIUM 9.5 09/06/2021 1044   PROT 6.7 09/06/2021 1044  ALBUMIN 4.5 09/06/2021 1044   AST 54 (H) 09/06/2021 1044   ALT 69 (H) 09/06/2021 1044   ALKPHOS 66 09/06/2021 1044   BILITOT 0.8 09/06/2021 1044   GFRNONAA >60 09/06/2021 1044   GFRAA >60 08/19/2018 0854   Lab Results  Component Value Date   WBC 4.2 09/27/2021   NEUTROABS 3.1 09/27/2021   HGB 14.5 09/27/2021   HCT RESULTS UNAVAILABLE DUE TO INTERFERING SUBSTANCE 09/27/2021   MCV RESULTS UNAVAILABLE DUE TO INTERFERING SUBSTANCE 09/27/2021   PLT 107 (L) 09/27/2021      Assessment/Plan 1. Anaplastic astrocytoma [facility])  Robert Watkins is clinically stable today, now having completed cycle #7 of CCNU and Tibsovo.   Labs demonstrate mild thrombocytopenia, ongoing.  Cycle #8 CCNU will be administered orally again at dose level '110mg'$ /m2 every 6 weeks.  Dose reduction may have to be considered for future cycles, if indicated, based on blood counts.  The patient will have a complete blood count and CMP performed on days 14, 28 and 42 of each cycle. Labs may need to be performed more often. Zofran will prescribed for home use for nausea/vomiting.   Will continue dosing Ivosidenib '500mg'$  daily given good control of previously progressive posterior aspect of the mass, overall very slow progression of active disease.  Chemotherapy should be held for the following:  ANC less than 1,000  Platelets less than 100,000  LFT or creatinine greater than 2x ULN  If clinical concerns/contraindications develop  We ask that Robert Watkins return to clinic in in 6 weeks with MRI brain for evaluation.  If stable will considering stopping CCNU at that time.  Will con't Keppra '500mg'$  BID.    We appreciate the opportunity to participate in the care of Robert Watkins.    All questions were answered. The patient knows to call the clinic with any problems, questions or concerns. No barriers to learning were detected.  I have spent a total of 30 minutes of face-to-face and non-face-to-face time, excluding clinical staff time, preparing to see patient, ordering tests and/or medications, counseling the patient, and independently interpreting results and communicating results to the patient/family/caregiver    Ventura Sellers, MD Medical Director of Neuro-Oncology [facility] [place] 09/27/21 11:01 AM

## 2021-09-28 ENCOUNTER — Telehealth: Payer: Self-pay | Admitting: Internal Medicine

## 2021-09-28 NOTE — Telephone Encounter (Signed)
Scheduled per 6/6 los, message has been left with pt

## 2021-10-18 ENCOUNTER — Telehealth: Payer: Self-pay | Admitting: *Deleted

## 2021-10-18 NOTE — Telephone Encounter (Signed)
Received request to refill Tibsovo 250 mg tablet.  Routed to provider to refill if appropriate.

## 2021-10-19 ENCOUNTER — Other Ambulatory Visit: Payer: Self-pay | Admitting: Internal Medicine

## 2021-10-19 DIAGNOSIS — C719 Malignant neoplasm of brain, unspecified: Secondary | ICD-10-CM

## 2021-10-19 MED ORDER — TIBSOVO 250 MG PO TABS: 500.0000 mg | ORAL_TABLET | Freq: Every day | ORAL | 0 refills | Status: DC

## 2021-11-01 ENCOUNTER — Telehealth: Payer: Self-pay | Admitting: *Deleted

## 2021-11-01 ENCOUNTER — Other Ambulatory Visit: Payer: Self-pay | Admitting: *Deleted

## 2021-11-01 ENCOUNTER — Other Ambulatory Visit (HOSPITAL_COMMUNITY): Payer: Self-pay

## 2021-11-01 ENCOUNTER — Telehealth: Payer: Self-pay | Admitting: Pharmacy Technician

## 2021-11-01 DIAGNOSIS — G40909 Epilepsy, unspecified, not intractable, without status epilepticus: Secondary | ICD-10-CM

## 2021-11-01 NOTE — Telephone Encounter (Signed)
Oral Oncology Patient Advocate Encounter   Received notification that prior authorization for Tibsovo is required.   PA submitted on 11/01/2021 Key [id number] Status is pending     Lady Deutscher, CPhT-Adv Pharmacy Patient Advocate Specialist [place] Patient Advocate Team Direct Number: [phone]  Fax: [phone]

## 2021-11-01 NOTE — Telephone Encounter (Signed)
Patient has been taking Tibsovo for some time however, [employer] is stating that it needs a new prior authorization on the most recent refill that Dr Mickeal Skinner sent on 10/19/2021.  Can you please assist with this?  Routed to Oral Chemotherapy Patient Advocate.

## 2021-11-01 NOTE — Telephone Encounter (Signed)
Oral Oncology Patient Advocate Encounter  Prior Authorization for Tibsovo has been approved.    PA# [id number] Effective dates: 11/01/2021 through 11/02/2022`  Patients co-pay is $0.    Lady Deutscher, CPhT-Adv Pharmacy Patient Advocate Specialist [place] Patient Advocate Team Direct Number: [phone]  Fax: [phone]

## 2021-11-02 ENCOUNTER — Other Ambulatory Visit: Payer: Self-pay | Admitting: Radiation Therapy

## 2021-11-02 ENCOUNTER — Other Ambulatory Visit: Payer: Self-pay | Admitting: Internal Medicine

## 2021-11-02 ENCOUNTER — Telehealth: Payer: Self-pay | Admitting: Internal Medicine

## 2021-11-02 NOTE — Telephone Encounter (Signed)
Per 7/12 schmsg called pt and left message about new appointment date and time.  Details and callback number were left

## 2021-11-08 ENCOUNTER — Inpatient Hospital Stay: Payer: Managed Care, Other (non HMO) | Admitting: Internal Medicine

## 2021-11-14 ENCOUNTER — Other Ambulatory Visit: Payer: Self-pay

## 2021-11-15 ENCOUNTER — Other Ambulatory Visit: Payer: Self-pay

## 2021-11-15 ENCOUNTER — Ambulatory Visit
Admission: RE | Admit: 2021-11-15 | Discharge: 2021-11-15 | Disposition: A | Discharge status: Home / Self Care | Payer: Managed Care, Other (non HMO) | Source: Ambulatory Visit | Attending: Internal Medicine | Admitting: Internal Medicine

## 2021-11-15 DIAGNOSIS — G40909 Epilepsy, unspecified, not intractable, without status epilepticus: Secondary | ICD-10-CM

## 2021-11-15 MED ORDER — GADOBENATE DIMEGLUMINE 529 MG/ML IV SOLN
19.0000 mL | Freq: Once | INTRAVENOUS | Status: AC | PRN
  Administered 2021-11-15: 19 mL via INTRAVENOUS

## 2021-11-17 ENCOUNTER — Ambulatory Visit: Payer: Managed Care, Other (non HMO) | Admitting: Internal Medicine

## 2021-11-17 ENCOUNTER — Inpatient Hospital Stay: Payer: Managed Care, Other (non HMO) | Attending: Internal Medicine

## 2021-11-17 ENCOUNTER — Other Ambulatory Visit: Payer: Self-pay

## 2021-11-17 ENCOUNTER — Other Ambulatory Visit (HOSPITAL_COMMUNITY): Payer: Self-pay

## 2021-11-17 ENCOUNTER — Inpatient Hospital Stay (HOSPITAL_BASED_OUTPATIENT_CLINIC_OR_DEPARTMENT_OTHER): Payer: Managed Care, Other (non HMO) | Admitting: Internal Medicine

## 2021-11-17 VITALS — BP 125/72 | HR 50 | Temp 98.4°F | Resp 20 | Wt 192.1 lb

## 2021-11-17 DIAGNOSIS — C719 Malignant neoplasm of brain, unspecified: Secondary | ICD-10-CM

## 2021-11-17 DIAGNOSIS — Z923 Personal history of irradiation: Secondary | ICD-10-CM | POA: Diagnosis not present

## 2021-11-17 DIAGNOSIS — Z9221 Personal history of antineoplastic chemotherapy: Secondary | ICD-10-CM | POA: Diagnosis not present

## 2021-11-17 DIAGNOSIS — C714 Malignant neoplasm of occipital lobe: Secondary | ICD-10-CM | POA: Insufficient documentation

## 2021-11-17 DIAGNOSIS — Z79899 Other long term (current) drug therapy: Secondary | ICD-10-CM | POA: Insufficient documentation

## 2021-11-17 LAB — CBC WITH DIFFERENTIAL (CANCER CENTER ONLY)
Abs Immature Granulocytes: 0.03 10*3/uL (ref 0.00–0.07)
Basophils Absolute: 0 10*3/uL (ref 0.0–0.1)
Basophils Relative: 0 %
Eosinophils Absolute: 0 10*3/uL (ref 0.0–0.5)
Eosinophils Relative: 1 %
Hemoglobin: 15 g/dL (ref 13.0–17.0)
Immature Granulocytes: 1 %
Lymphocytes Relative: 17 %
Lymphs Abs: 0.7 10*3/uL (ref 0.7–4.0)
Monocytes Absolute: 0.3 10*3/uL (ref 0.1–1.0)
Monocytes Relative: 7 %
Neutro Abs: 3.1 10*3/uL (ref 1.7–7.7)
Neutrophils Relative %: 74 %
Platelet Count: 109 10*3/uL — ABNORMAL LOW (ref 150–400)
WBC Count: 4.1 10*3/uL (ref 4.0–10.5)
nRBC: 0 % (ref 0.0–0.2)

## 2021-11-17 LAB — CMP (CANCER CENTER ONLY)
ALT: 32 U/L (ref 0–44)
AST: 26 U/L (ref 15–41)
Albumin: 4.5 g/dL (ref 3.5–5.0)
Alkaline Phosphatase: 67 U/L (ref 38–126)
Anion gap: 4 — ABNORMAL LOW (ref 5–15)
BUN: 16 mg/dL (ref 6–20)
CO2: 28 mmol/L (ref 22–32)
Calcium: 9.2 mg/dL (ref 8.9–10.3)
Chloride: 107 mmol/L (ref 98–111)
Creatinine: 1.07 mg/dL (ref 0.61–1.24)
GFR, Estimated: >60 mL/min (ref >60)
Glucose, Bld: 92 mg/dL (ref 70–99)
Potassium: 4.4 mmol/L (ref 3.5–5.1)
Sodium: 139 mmol/L (ref 135–145)
Total Bilirubin: 0.8 mg/dL (ref 0.3–1.2)
Total Protein: 6.7 g/dL (ref 6.5–8.1)

## 2021-11-17 MED ORDER — LAMOTRIGINE 25 MG PO TABS
ORAL_TABLET | ORAL | 0 refills | Status: DC
  Filled 2021-11-17: qty 21, 14d supply, fill #0

## 2021-11-17 MED ORDER — LAMOTRIGINE 100 MG PO TABS
100.0000 mg | ORAL_TABLET | Freq: Every day | ORAL | 1 refills | Status: DC
  Filled 2021-11-17: qty 30, 30d supply, fill #0
  Filled 2022-04-13: qty 30, 30d supply, fill #1

## 2021-11-17 NOTE — Progress Notes (Signed)
[place] at [place], [place] [address] [phone]   Interval Evaluation  Date of Service: 11/17/21 Patient Name: Robert Watkins Patient MRN: [id number] Patient DOB: 02/28/1986 Provider: Ventura Sellers, MD  Identifying Statement:  Robert Watkins is a 36 y.o. 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 Kristeen Miss in [place]. 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 [name]. Pathology confirms anaplastic astrocytoma.   06/01/2010 Clinical Event-Other  New patient evaluation at [facility] [place] [facility]. Recommend radiation therapy with concurrent Temozolomide followed by twelve cycles of five-day Temozolomide.   06/13/2010 - 08/01/2010 Radiation  Radiation with Temodar.  06/13/2010 - 08/01/2010 Chemotherapy  Temodar 75 mg/m2 with radiation.  08/16/2010 - 08/07/2011 Chemotherapy  5 day Temodar 200 mg/m2  02/19/2018 Progression  Progression of non-enhancing tumor compared to 2013. Recommend initiating metronomic daily Temodar 50 mg/m2/day.  02/12/20 Progression Initiates Tibsovo '500mg'$  daily  10/21/20 Progression Adds oral CCNU '90mg'$ /m2 q6 weeks to daily Tibsovo  Interval History:  [name] presents today for follow up after completing cycle #8 of oral CCNU with Tibsovo.  Continues to tolerate treatment well without issue. No noteworthy changes today.  Continues to describe modest fatigue.  No headaches or seizures.  He remains active and functionally independent. Continues to work full time.  Medications: Current Outpatient Medications on File Prior to Visit  Medication Sig Dispense Refill   ivosidenib (TIBSOVO) 250 MG  tablet Take 2 tablets (500 mg total) by mouth daily. 60 tablet 0   levETIRAcetam (KEPPRA) 500 MG tablet Take 1 tablet by mouth 2 times daily. 180 tablet 2   ondansetron (ZOFRAN) 8 MG tablet Take 1 tablet by mouth 2 times daily as needed. Start on the third day after chemotherapy. (Patient not taking: Reported on 11/17/2021) 30 tablet 1   No current facility-administered medications on file prior to visit.    Allergies:  Allergies  Allergen Reactions   Lamotrigine Rash   Past Medical History:  Past Medical History:  Diagnosis Date   Cancer of brain ([place]) 04/07/2011   Depression, reactive 04/07/2011   History of chemotherapy 06/13/2010-08/01/2010   42 day Temodar with concurrent Radiation   History of chemotherapy 08/16/2010-08/07/2011   5 day Temodar 200 mg/m2   History of radiation therapy 06/13/2010-08/01/2010   [facility]. Recommend radiation therapy with concurrent Temozolomide followed by twelve cycles of five-day Temozolomide. ?   Seizure disorder, secondary ([place]) 04/07/2011   Past Surgical History:  Past Surgical History:  Procedure Laterality Date   CRANIECTOMY / CRANIOTOMY FOR EXCISION OF BRAIN TUMOR Left 05/26/2010   Left parietooccipital craniotomy with tumor resection by Dr. [name]. Pathology confirms anaplastic astrocytoma. ?   CRANIOTOMY FOR STEREOTACTIC GUIDED SURGERY Left 05/12/2010   Stereotactic biopsy of left occipital lesion by Dr. Kristeen Miss in [place]. Pathology demonstrates anaplastic astrocytoma (WHO III)?   Social History:  Social History   Socioeconomic History   Marital status: Single    Spouse name: Not on file   Number of children: Not on file   Years of education: Not on file   Highest education level: Not on file  Occupational History   Not on file  Tobacco Use  Smoking status: Former   Smokeless tobacco: Never  Substance and Sexual Activity   Alcohol use: Yes    Alcohol/week: 4.0  standard drinks of alcohol    Types: 4 Shots of liquor per week   Drug use: Yes    Types: Marijuana   Sexual activity: Yes  Other Topics Concern   Not on file  Social History Narrative   Not on file   Social Determinants of Health   Financial Resource Strain: Not on file  Food Insecurity: Not on file  Transportation Needs: Not on file  Physical Activity: Not on file  Stress: Not on file  Social Connections: Not on file  Intimate Partner Violence: Not on file   Family History: No family history on file.  Review of Systems: Constitutional: Denies fevers, chills or abnormal weight loss Eyes: Denies blurriness of vision Ears, nose, mouth, throat, and face: Denies mucositis or sore throat Respiratory: Denies cough, dyspnea or wheezes Cardiovascular: Denies palpitation, chest discomfort or lower extremity swelling Gastrointestinal:  Denies nausea, constipation, diarrhea GU: Denies dysuria or incontinence Skin: Denies abnormal skin rashes Neurological: Per HPI Musculoskeletal: Denies joint pain, back or neck discomfort. No decrease in ROM Behavioral/Psych: Denies anxiety, disturbance in thought content, and mood instability  Physical Exam: Vitals:   11/17/21 0945  BP: 125/72  Pulse: (!) 50  Resp: 20  Temp: 98.4 F (36.9 C)  SpO2: 100%    KPS: 100. General: Alert, cooperative, pleasant, in no acute distress Head: Normal EENT: No conjunctival injection or scleral icterus. Oral mucosa moist Lungs: Resp effort normal Cardiac: Regular rate and rhythm Abdomen: Soft, non-distended abdomen Skin: No rashes cyanosis or petechiae. Extremities: No clubbing or edema  Neurologic Exam: Mental Status: Awake, alert, attentive to examiner. Oriented to self and environment. Language is fluent with intact comprehension.  Cranial Nerves: Visual acuity is grossly normal. Visual fields are full. Extra-ocular movements intact. No ptosis. Face is symmetric, tongue midline. Motor: Tone and  bulk are normal. Power is full in both arms and legs. Reflexes are symmetric, no pathologic reflexes present. Intact finger to nose bilaterally Sensory: Intact to light touch and temperature Gait: Normal and tandem gait is normal.   Labs: I have reviewed the data as listed    Component Value Date/Time   NA 139 11/17/2021 0933   K 4.4 11/17/2021 0933   CL 107 11/17/2021 0933   CO2 28 11/17/2021 0933   GLUCOSE 92 11/17/2021 0933   BUN 16 11/17/2021 0933   CREATININE 1.07 11/17/2021 0933   CALCIUM 9.2 11/17/2021 0933   PROT 6.7 11/17/2021 0933   ALBUMIN 4.5 11/17/2021 0933   AST 26 11/17/2021 0933   ALT 32 11/17/2021 0933   ALKPHOS 67 11/17/2021 0933   BILITOT 0.8 11/17/2021 0933   GFRNONAA >60 11/17/2021 0933   GFRAA >60 08/19/2018 0854   Lab Results  Component Value Date   WBC 4.1 11/17/2021   NEUTROABS 3.1 11/17/2021   HGB 15.0 11/17/2021   HCT RESULTS UNAVAILABLE DUE TO INTERFERING SUBSTANCE 11/17/2021   MCV RESULTS UNAVAILABLE DUE TO INTERFERING SUBSTANCE 11/17/2021   PLT 109 (L) 11/17/2021    Imaging:  [place] Clinician Interpretation: I have personally reviewed the CNS images as listed.  My interpretation, in the context of the patient's clinical presentation, is stable disease  MR Brain W Wo Contrast  Result Date: 11/15/2021 CLINICAL DATA:  Anaplastic astrocytoma, follow-up EXAM: MRI HEAD WITHOUT AND WITH CONTRAST TECHNIQUE: Multiplanar, multiecho pulse sequences of the brain and surrounding structures  were obtained without and with intravenous contrast. CONTRAST:  79m MULTIHANCE GADOBENATE DIMEGLUMINE 529 MG/ML IV SOLN COMPARISON:  08/12/2021 FINDINGS: Brain: Prior left parieto-occipital craniotomy with subjacent resection cavity in the left occipital lobe, with unchanged surrounding T2 hyperintense signal when compared to the immediate prior but slow progression compared to more remote priors. No new or nodular contrast enhancement. No acute infarct, hemorrhage, mass  effect, or midline shift. No hydrocephalus or extra-axial collection. Vascular: Normal arterial flow voids. Normal venous and arterial enhancement. Skull and upper cervical spine: Prior left parieto-occipital craniotomy. Otherwise normal marrow signal. Sinuses/Orbits: No acute finding. Other: The mastoids are well aerated. IMPRESSION: Stable exam, with unchanged surrounding T2 hyperintense signal about the left occipital resection cavity and no new or nodular enhancement. Electronically Signed   By: AMerilyn BabaM.D.   On: 11/15/2021 13:50     Assessment/Plan 1. Anaplastic astrocytoma ([facility]  Mr. PCothronis clinically stable today, now having completed cycle #8 of CCNU and Tibsovo.  MRI brain demonstrates stable findings.  We recommended holding further CCNU given 1 year/8 cycles complete, especially given thrombocytopenia.  He is agreeable with this.  We will however continue dosing Ivosidenib '500mg'$  daily given good control and tolerance.  Chemotherapy should be held for the following:  ANC less than 1,000  Platelets less than 100,000  LFT or creatinine greater than 2x ULN  If clinical concerns/contraindications develop  Further discussion today regarding mood effects of Keppra.  We discussed a trial of Lamictal '25mg'$  daily x7 days, '50mg'$  daily x7 days, then '100mg'$  thereafter.  Once at '100mg'$  daily, Keppra could be stopped.  If rash develops at any point, Lamictal will be discontinued and Keppra resumed as prior.  We ask that NCoye Dawoodreturn to clinic in 3 months following next brain MRI, or sooner as needed.  We appreciate the opportunity to participate in the care of NArth Nicastro    All questions were answered. The patient knows to call the clinic with any problems, questions or concerns. No barriers to learning were detected.  I have spent a total of 30 minutes of face-to-face and non-face-to-face time, excluding clinical staff time, preparing to see patient, ordering tests and/or  medications, counseling the patient, and independently interpreting results and communicating results to the patient/family/caregiver    ZVentura Sellers MD Medical Director of Neuro-Oncology [facility]07/27/23 3:27 PM

## 2021-11-19 ENCOUNTER — Other Ambulatory Visit: Payer: Self-pay

## 2021-11-21 ENCOUNTER — Inpatient Hospital Stay: Payer: Managed Care, Other (non HMO)

## 2021-11-21 ENCOUNTER — Other Ambulatory Visit: Payer: Self-pay | Admitting: Radiation Therapy

## 2021-11-22 ENCOUNTER — Other Ambulatory Visit: Payer: Self-pay

## 2021-11-22 ENCOUNTER — Other Ambulatory Visit: Payer: Managed Care, Other (non HMO)

## 2021-11-22 ENCOUNTER — Other Ambulatory Visit: Payer: Self-pay | Admitting: *Deleted

## 2021-11-22 ENCOUNTER — Ambulatory Visit: Payer: Managed Care, Other (non HMO) | Admitting: Internal Medicine

## 2021-11-22 DIAGNOSIS — C719 Malignant neoplasm of brain, unspecified: Secondary | ICD-10-CM

## 2021-11-22 MED ORDER — TIBSOVO 250 MG PO TABS: 500.0000 mg | ORAL_TABLET | Freq: Every day | ORAL | 0 refills | Status: DC

## 2021-11-22 NOTE — Progress Notes (Signed)
Processed refill via fax to [facility]

## 2021-11-25 ENCOUNTER — Other Ambulatory Visit: Payer: Self-pay

## 2021-12-01 ENCOUNTER — Other Ambulatory Visit (HOSPITAL_COMMUNITY): Payer: Self-pay

## 2021-12-22 ENCOUNTER — Telehealth: Payer: Self-pay

## 2021-12-22 NOTE — Telephone Encounter (Signed)
Fax received requesting refill of Tibsovo 250ng tablets.  Signed order has been faxed back and confirmation was received.   Completed order sent to HIM for scanning.

## 2022-01-23 ENCOUNTER — Other Ambulatory Visit: Payer: Self-pay | Admitting: *Deleted

## 2022-01-23 DIAGNOSIS — C719 Malignant neoplasm of brain, unspecified: Secondary | ICD-10-CM

## 2022-01-23 MED ORDER — TIBSOVO 250 MG PO TABS: 500.0000 mg | ORAL_TABLET | Freq: Every day | ORAL | 0 refills | Status: DC

## 2022-02-16 ENCOUNTER — Other Ambulatory Visit: Payer: Self-pay

## 2022-02-17 ENCOUNTER — Ambulatory Visit
Admission: RE | Admit: 2022-02-17 | Discharge: 2022-02-17 | Disposition: A | Discharge status: Home / Self Care | Payer: Managed Care, Other (non HMO) | Source: Ambulatory Visit | Attending: Internal Medicine | Admitting: Internal Medicine

## 2022-02-17 DIAGNOSIS — C719 Malignant neoplasm of brain, unspecified: Secondary | ICD-10-CM

## 2022-02-17 MED ORDER — GADOPICLENOL 0.5 MMOL/ML IV SOLN
9.0000 mL | Freq: Once | INTRAVENOUS | Status: AC | PRN
  Administered 2022-02-17: 9 mL via INTRAVENOUS

## 2022-02-20 ENCOUNTER — Inpatient Hospital Stay: Payer: Managed Care, Other (non HMO) | Attending: Internal Medicine

## 2022-02-20 ENCOUNTER — Inpatient Hospital Stay: Payer: Managed Care, Other (non HMO) | Admitting: Internal Medicine

## 2022-02-20 ENCOUNTER — Other Ambulatory Visit: Payer: Self-pay | Admitting: *Deleted

## 2022-02-20 ENCOUNTER — Telehealth: Payer: Self-pay | Admitting: *Deleted

## 2022-02-20 DIAGNOSIS — C719 Malignant neoplasm of brain, unspecified: Secondary | ICD-10-CM

## 2022-02-20 MED ORDER — TIBSOVO 250 MG PO TABS: 500.0000 mg | ORAL_TABLET | Freq: Every day | ORAL | 0 refills | Status: DC

## 2022-02-20 NOTE — Telephone Encounter (Signed)
Pushed imaging out to [facility].  Attempted to reach patient to move up his follow up since it is scheduled out almost 2 weeks from now.  Left message pending call back.

## 2022-02-21 ENCOUNTER — Inpatient Hospital Stay: Payer: Managed Care, Other (non HMO) | Admitting: Internal Medicine

## 2022-03-02 ENCOUNTER — Inpatient Hospital Stay: Payer: Managed Care, Other (non HMO)

## 2022-03-02 ENCOUNTER — Inpatient Hospital Stay: Payer: Managed Care, Other (non HMO) | Attending: Internal Medicine | Admitting: Internal Medicine

## 2022-03-02 ENCOUNTER — Other Ambulatory Visit: Payer: Self-pay

## 2022-03-02 VITALS — BP 130/82 | HR 52 | Temp 98.1°F | Resp 16 | Wt 182.7 lb

## 2022-03-02 DIAGNOSIS — C714 Malignant neoplasm of occipital lobe: Secondary | ICD-10-CM | POA: Insufficient documentation

## 2022-03-02 DIAGNOSIS — Z79899 Other long term (current) drug therapy: Secondary | ICD-10-CM | POA: Diagnosis not present

## 2022-03-02 DIAGNOSIS — Z9221 Personal history of antineoplastic chemotherapy: Secondary | ICD-10-CM | POA: Insufficient documentation

## 2022-03-02 DIAGNOSIS — C719 Malignant neoplasm of brain, unspecified: Secondary | ICD-10-CM

## 2022-03-02 DIAGNOSIS — Z923 Personal history of irradiation: Secondary | ICD-10-CM | POA: Diagnosis not present

## 2022-03-02 DIAGNOSIS — G40909 Epilepsy, unspecified, not intractable, without status epilepticus: Secondary | ICD-10-CM

## 2022-03-02 LAB — CMP (CANCER CENTER ONLY)
ALT: 77 U/L — ABNORMAL HIGH (ref 0–44)
AST: 49 U/L — ABNORMAL HIGH (ref 15–41)
Albumin: 4.5 g/dL (ref 3.5–5.0)
Alkaline Phosphatase: 78 U/L (ref 38–126)
Anion gap: 5 (ref 5–15)
BUN: 16 mg/dL (ref 6–20)
CO2: 29 mmol/L (ref 22–32)
Calcium: 9.5 mg/dL (ref 8.9–10.3)
Chloride: 105 mmol/L (ref 98–111)
Creatinine: 1.06 mg/dL (ref 0.61–1.24)
GFR, Estimated: >60 mL/min (ref >60)
Glucose, Bld: 85 mg/dL (ref 70–99)
Potassium: 4.4 mmol/L (ref 3.5–5.1)
Sodium: 139 mmol/L (ref 135–145)
Total Bilirubin: 0.9 mg/dL (ref 0.3–1.2)
Total Protein: 6.9 g/dL (ref 6.5–8.1)

## 2022-03-02 LAB — CBC WITH DIFFERENTIAL (CANCER CENTER ONLY)
Abs Immature Granulocytes: 0.04 10*3/uL (ref 0.00–0.07)
Basophils Absolute: 0 10*3/uL (ref 0.0–0.1)
Basophils Relative: 0 %
Eosinophils Absolute: 0.1 10*3/uL (ref 0.0–0.5)
Eosinophils Relative: 2 %
Hemoglobin: 15.5 g/dL (ref 13.0–17.0)
Immature Granulocytes: 1 %
Lymphocytes Relative: 17 %
Lymphs Abs: 0.6 10*3/uL — ABNORMAL LOW (ref 0.7–4.0)
Monocytes Absolute: 0.2 10*3/uL (ref 0.1–1.0)
Monocytes Relative: 7 %
Neutro Abs: 2.4 10*3/uL (ref 1.7–7.7)
Neutrophils Relative %: 73 %
Platelet Count: 162 10*3/uL (ref 150–400)
WBC Count: 3.4 10*3/uL — ABNORMAL LOW (ref 4.0–10.5)
nRBC: 0 % (ref 0.0–0.2)

## 2022-03-02 MED ORDER — LOMUSTINE 100 MG PO CAPS: 100.0000 mg/m2 | ORAL_CAPSULE | Freq: Once | ORAL | 0 refills | Status: AC

## 2022-03-02 NOTE — Progress Notes (Signed)
[facility] [place], [place] [address] [phone]   Interval Evaluation  Date of Service: 03/02/22 Patient Name: Robert Watkins Patient MRN: [id number] Patient DOB: 09/17/1985 Provider: Ventura Sellers, MD  Identifying Statement:  Robert Watkins is a 36 y.o. 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 Kristeen Miss in [place]. 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 [name]. Pathology confirms anaplastic astrocytoma.   06/01/2010 Clinical Event-Other  New patient evaluation at [facility]. Recommend radiation therapy with concurrent Temozolomide followed by twelve cycles of five-day Temozolomide.   06/13/2010 - 08/01/2010 Radiation  Radiation with Temodar.  06/13/2010 - 08/01/2010 Chemotherapy  Temodar 75 mg/m2 with radiation.  08/16/2010 - 08/07/2011 Chemotherapy  5 day Temodar 200 mg/m2  02/19/2018 Progression  Progression of non-enhancing tumor compared to 2013. Recommend initiating metronomic daily Temodar 50 mg/m2/day.  02/12/20 Progression Initiates Tibsovo '500mg'$  daily  10/21/20 Progression Adds oral CCNU '90mg'$ /m2 q6 weeks to daily Tibsovo  Interval History:  Robert Watkins presents today for follow up after recent MRI brain.  No signifcant changes described today.  Continues to complain of modest fatigue.  No headaches or seizures.  He remains active and functionally independent. Continues to work full time.  Medications: Current Outpatient Medications on File Prior to Visit  Medication Sig Dispense Refill   ivosidenib (TIBSOVO) 250 MG tablet Take 2 tablets (500 mg total) by mouth daily. 60 tablet 0    levETIRAcetam (KEPPRA) 500 MG tablet Take 1 tablet by mouth 2 times daily. 180 tablet 2   lamoTRIgine (LAMICTAL) 100 MG tablet Take 1 tablet (100 mg total) by mouth daily. (Patient not taking: Reported on 03/02/2022) 60 tablet 1   lamoTRIgine (LAMICTAL) 25 MG tablet Take 1 tablet (25 mg total) by mouth daily for 7 days, THEN 2 tablets (50 mg total) daily for 7 days. (Patient not taking: Reported on 03/02/2022) 21 tablet 0   ondansetron (ZOFRAN) 8 MG tablet Take 1 tablet by mouth 2 times daily as needed. Start on the third day after chemotherapy. (Patient not taking: Reported on 11/17/2021) 30 tablet 1   No current facility-administered medications on file prior to visit.    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 Recommend radiation therapy with concurrent Temozolomide followed by twelve cycles of five-day Temozolomide. ?   Seizure disorder, secondary ([place]) 04/07/2011   Past Surgical History:  Past Surgical History:  Procedure Laterality Date   CRANIECTOMY / CRANIOTOMY FOR EXCISION OF BRAIN TUMOR Left 05/26/2010   Left parietooccipital craniotomy with tumor resection by Dr. [name]. Pathology confirms anaplastic astrocytoma. ?   CRANIOTOMY FOR STEREOTACTIC GUIDED SURGERY Left 05/12/2010   Stereotactic biopsy of left occipital lesion by Dr. Kristeen Miss in [place]. Pathology demonstrates anaplastic astrocytoma (WHO III)?   Social History:  Social History  Socioeconomic History   Marital status: Single    Spouse name: Not on file   Number of children:  Not on file   Years of education: Not on file   Highest education level: Not on file  Occupational History   Not on file  Tobacco Use   Smoking status: Former   Smokeless tobacco: Never  Substance and Sexual Activity   Alcohol use: Yes    Alcohol/week: 4.0 standard drinks of alcohol    Types: 4 Shots of liquor per week   Drug use: Yes    Types: Marijuana   Sexual activity: Yes  Other Topics Concern   Not on file  Social History Narrative   Not on file   Social Determinants of Health   Financial Resource Strain: Not on file  Food Insecurity: Not on file  Transportation Needs: Not on file  Physical Activity: Not on file  Stress: Not on file  Social Connections: Not on file  Intimate Partner Violence: Not on file   Family History: No family history on file.  Review of Systems: Constitutional: Denies fevers, chills or abnormal weight loss Eyes: Denies blurriness of vision Ears, nose, mouth, throat, and face: Denies mucositis or sore throat Respiratory: Denies cough, dyspnea or wheezes Cardiovascular: Denies palpitation, chest discomfort or lower extremity swelling Gastrointestinal:  Denies nausea, constipation, diarrhea GU: Denies dysuria or incontinence Skin: Denies abnormal skin rashes Neurological: Per HPI Musculoskeletal: Denies joint pain, back or neck discomfort. No decrease in ROM Behavioral/Psych: Denies anxiety, disturbance in thought content, and mood instability  Physical Exam: Vitals:   03/02/22 1028  BP: 130/82  Pulse: (!) 52  Resp: 16  Temp: 98.1 F (36.7 C)  SpO2: 100%    KPS: 90. General: Alert, cooperative, pleasant, in no acute distress Head: Normal EENT: No conjunctival injection or scleral icterus. Oral mucosa moist Lungs: Resp effort normal Cardiac: Regular rate and rhythm Abdomen: Soft, non-distended abdomen Skin: No rashes cyanosis or petechiae. Extremities: No clubbing or edema  Neurologic Exam: Mental Status: Awake, alert,  attentive to examiner. Oriented to self and environment. Language is fluent with intact comprehension.  Cranial Nerves: Visual acuity is grossly normal. Visual fields are full. Extra-ocular movements intact. No ptosis. Face is symmetric, tongue midline. Motor: Tone and bulk are normal. Power is full in both arms and legs. Reflexes are symmetric, no pathologic reflexes present. Intact finger to nose bilaterally Sensory: Intact to light touch and temperature Gait: Normal and tandem gait is normal.   Labs: I have reviewed the data as listed    Component Value Date/Time   NA 139 11/17/2021 0933   K 4.4 11/17/2021 0933   CL 107 11/17/2021 0933   CO2 28 11/17/2021 0933   GLUCOSE 92 11/17/2021 0933   BUN 16 11/17/2021 0933   CREATININE 1.07 11/17/2021 0933   CALCIUM 9.2 11/17/2021 0933   PROT 6.7 11/17/2021 0933   ALBUMIN 4.5 11/17/2021 0933   AST 26 11/17/2021 0933   ALT 32 11/17/2021 0933   ALKPHOS 67 11/17/2021 0933   BILITOT 0.8 11/17/2021 0933   GFRNONAA >60 11/17/2021 0933   GFRAA >60 08/19/2018 0854   Lab Results  Component Value Date   WBC 4.1 11/17/2021   NEUTROABS 3.1 11/17/2021   HGB 15.0 11/17/2021   HCT RESULTS UNAVAILABLE DUE TO INTERFERING SUBSTANCE 11/17/2021   MCV RESULTS UNAVAILABLE DUE TO INTERFERING SUBSTANCE 11/17/2021   PLT 109 (L) 11/17/2021    Imaging:  [place] Clinician Interpretation: I have personally reviewed the  CNS images as listed.  My interpretation, in the context of the patient's clinical presentation, is progressive disease  MR BRAIN W WO CONTRAST  Result Date: 02/19/2022 CLINICAL DATA:  Astrocytoma follow-up. EXAM: MRI HEAD WITHOUT AND WITH CONTRAST TECHNIQUE: Multiplanar, multiecho pulse sequences of the brain and surrounding structures were obtained without and with intravenous contrast. CONTRAST:  9 cc Vueway COMPARISON:  Brain MRI 11/15/2021 FINDINGS: Brain: Postsurgical changes reflecting posterior craniotomy for mass resection are again  seen. Encephalomalacia, gliosis, and a small amount of chronic blood products at the occipital lobe resection cavity are stable. FLAIR signal abnormality around the cavity most notably posterior to the left lateral ventricle and anteromedial to the resection cavity in the occipital lobe is stable; however, there is new FLAIR signal abnormality and enhancement in the left thalamus suspicious for recurrent tumor (19-96, 12-20). This also demonstrates mild diffusion restriction (7-68). There is mild FLAIR signal abnormality without enhancement along the ependymal lining of the left lateral ventricle posterior to the area of enhancement which is also new/increased since the prior study (12-21). There is no significant mass effect or midline shift. There is no evidence of acute intracranial hemorrhage, extra-axial fluid collection, or acute infarct. Background parenchymal volume is normal. The ventricles are normal in size. Gray-white differentiation is preserved. A right frontal developmental venous anomaly is unchanged. Vascular: Normal flow voids. Skull and upper cervical spine: Normal marrow signal. Sinuses/Orbits: The paranasal sinuses are clear. The globes and orbits are unremarkable. Other: None. IMPRESSION: New FLAIR signal abnormality and enhancement in the left thalamus with nonenhancing FLAIR signal abnormality extending more posteriorly along the ependymal lining of the left lateral ventricle, suspicious for recurrent tumor. Electronically Signed   By: Valetta Mole M.D.   On: 02/19/2022 18:16     Assessment/Plan 1. Anaplastic astrocytoma [facility])  Robert Watkins is clinically stable today, now on Tibsovo only, having completed 1 year of CCNU therapy over the summer.  MRI brain unfortunately demonstrates progressive findings, with novel enhancement and FLAIR signal abnormality within left thalamic body.  We recommended resuming one additional cycle of CCNU given progression, lapse of treatment.  He is  agreeable with this.  We will also continue dosing Ivosidenib '500mg'$  daily given good control and tolerance.  Chemotherapy should be held for the following:  ANC less than 1,000  Platelets less than 100,000  LFT or creatinine greater than 2x ULN  If clinical concerns/contraindications develop  He will give further consideration to Lamictal transition as dicussed and detailed in prior visit note.  We ask that Robert Watkins return to clinic in 6 weeks following next brain MRI, or sooner as needed.  We appreciate the opportunity to participate in the care of Robert Watkins.    All questions were answered. The patient knows to call the clinic with any problems, questions or concerns. No barriers to learning were detected.  I have spent a total of 40 minutes of face-to-face and non-face-to-face time, excluding clinical staff time, preparing to see patient, ordering tests and/or medications, counseling the patient, and independently interpreting results and communicating results to the patient/family/caregiver    Ventura Sellers, MD Medical Director of Neuro-Oncology [facility] at [place] 03/02/22 10:34 AM

## 2022-03-03 ENCOUNTER — Other Ambulatory Visit: Payer: Self-pay

## 2022-03-14 ENCOUNTER — Other Ambulatory Visit: Payer: Self-pay | Admitting: *Deleted

## 2022-03-14 DIAGNOSIS — G40909 Epilepsy, unspecified, not intractable, without status epilepticus: Secondary | ICD-10-CM

## 2022-03-14 DIAGNOSIS — C719 Malignant neoplasm of brain, unspecified: Secondary | ICD-10-CM

## 2022-03-14 MED ORDER — LEVETIRACETAM 500 MG PO TABS: 500.0000 mg | ORAL_TABLET | Freq: Two times a day (BID) | ORAL | 2 refills | Status: DC

## 2022-03-15 ENCOUNTER — Other Ambulatory Visit: Payer: Self-pay

## 2022-03-27 ENCOUNTER — Other Ambulatory Visit: Payer: Self-pay | Admitting: Radiation Therapy

## 2022-03-31 ENCOUNTER — Telehealth: Payer: Self-pay | Admitting: *Deleted

## 2022-03-31 NOTE — Telephone Encounter (Signed)
Received call from pt. He states he has had a covid exposure this week, though is currently without symptoms.  Advised to wear a mask when out and about and at work for the next 5-10 days. If any s/s develop, advised to get a covid test.  He also asked if Dr. Mickeal Skinner has heard of any experimental studies being done with using Lidocaine for brain tumors. Pt states he had read something about that recently.

## 2022-04-06 ENCOUNTER — Encounter: Payer: Self-pay | Admitting: Internal Medicine

## 2022-04-06 ENCOUNTER — Other Ambulatory Visit: Payer: Managed Care, Other (non HMO)

## 2022-04-07 ENCOUNTER — Other Ambulatory Visit: Payer: Self-pay

## 2022-04-09 ENCOUNTER — Other Ambulatory Visit: Payer: Self-pay | Admitting: Internal Medicine

## 2022-04-10 ENCOUNTER — Inpatient Hospital Stay: Payer: Managed Care, Other (non HMO)

## 2022-04-10 NOTE — Telephone Encounter (Signed)
Patient has to have MRI done before this will be refilled.

## 2022-04-11 ENCOUNTER — Other Ambulatory Visit: Payer: Self-pay | Admitting: Pharmacist

## 2022-04-11 ENCOUNTER — Inpatient Hospital Stay: Payer: Managed Care, Other (non HMO) | Admitting: Internal Medicine

## 2022-04-12 ENCOUNTER — Other Ambulatory Visit: Payer: Self-pay | Admitting: Radiation Therapy

## 2022-04-12 ENCOUNTER — Ambulatory Visit
Admission: RE | Admit: 2022-04-12 | Discharge: 2022-04-12 | Disposition: A | Discharge status: Home / Self Care | Payer: Managed Care, Other (non HMO) | Source: Ambulatory Visit | Attending: Internal Medicine | Admitting: Internal Medicine

## 2022-04-12 ENCOUNTER — Other Ambulatory Visit: Payer: Self-pay | Admitting: Internal Medicine

## 2022-04-12 DIAGNOSIS — C719 Malignant neoplasm of brain, unspecified: Secondary | ICD-10-CM

## 2022-04-12 MED ORDER — GADOPICLENOL 0.5 MMOL/ML IV SOLN
8.0000 mL | Freq: Once | INTRAVENOUS | Status: AC | PRN
  Administered 2022-04-12: 8 mL via INTRAVENOUS

## 2022-04-13 ENCOUNTER — Other Ambulatory Visit (HOSPITAL_COMMUNITY): Payer: Self-pay

## 2022-04-13 ENCOUNTER — Other Ambulatory Visit: Payer: Self-pay | Admitting: *Deleted

## 2022-04-13 ENCOUNTER — Other Ambulatory Visit: Payer: Self-pay

## 2022-04-13 MED ORDER — LAMOTRIGINE 100 MG PO TABS: 100.0000 mg | ORAL_TABLET | Freq: Every day | ORAL | 1 refills | Status: DC

## 2022-04-13 NOTE — Telephone Encounter (Signed)
Patient has to have MRI and see provider before refill would be determined.

## 2022-04-20 ENCOUNTER — Telehealth: Payer: Self-pay | Admitting: *Deleted

## 2022-04-20 ENCOUNTER — Inpatient Hospital Stay: Payer: Managed Care, Other (non HMO) | Attending: Internal Medicine | Admitting: Internal Medicine

## 2022-04-20 ENCOUNTER — Telehealth: Payer: Self-pay | Admitting: Internal Medicine

## 2022-04-20 ENCOUNTER — Inpatient Hospital Stay: Payer: Managed Care, Other (non HMO)

## 2022-04-20 VITALS — BP 126/81 | HR 61 | Temp 98.2°F | Resp 16 | Wt 182.4 lb

## 2022-04-20 DIAGNOSIS — Z923 Personal history of irradiation: Secondary | ICD-10-CM | POA: Diagnosis not present

## 2022-04-20 DIAGNOSIS — G40909 Epilepsy, unspecified, not intractable, without status epilepticus: Secondary | ICD-10-CM

## 2022-04-20 DIAGNOSIS — D696 Thrombocytopenia, unspecified: Secondary | ICD-10-CM | POA: Diagnosis not present

## 2022-04-20 DIAGNOSIS — Z87891 Personal history of nicotine dependence: Secondary | ICD-10-CM | POA: Insufficient documentation

## 2022-04-20 DIAGNOSIS — C719 Malignant neoplasm of brain, unspecified: Secondary | ICD-10-CM

## 2022-04-20 DIAGNOSIS — Z9221 Personal history of antineoplastic chemotherapy: Secondary | ICD-10-CM | POA: Diagnosis not present

## 2022-04-20 DIAGNOSIS — C714 Malignant neoplasm of occipital lobe: Secondary | ICD-10-CM | POA: Diagnosis not present

## 2022-04-20 DIAGNOSIS — F419 Anxiety disorder, unspecified: Secondary | ICD-10-CM | POA: Insufficient documentation

## 2022-04-20 DIAGNOSIS — F32A Depression, unspecified: Secondary | ICD-10-CM | POA: Insufficient documentation

## 2022-04-20 LAB — CBC WITH DIFFERENTIAL (CANCER CENTER ONLY)
Abs Immature Granulocytes: 0.02 10*3/uL (ref 0.00–0.07)
Basophils Absolute: 0 10*3/uL (ref 0.0–0.1)
Basophils Relative: 1 %
Eosinophils Absolute: 0.1 10*3/uL (ref 0.0–0.5)
Eosinophils Relative: 1 %
Hemoglobin: 15.8 g/dL (ref 13.0–17.0)
Immature Granulocytes: 1 %
Lymphocytes Relative: 13 %
Lymphs Abs: 0.5 10*3/uL — ABNORMAL LOW (ref 0.7–4.0)
Monocytes Absolute: 0.2 10*3/uL (ref 0.1–1.0)
Monocytes Relative: 6 %
Neutro Abs: 2.7 10*3/uL (ref 1.7–7.7)
Neutrophils Relative %: 78 %
Platelet Count: 123 10*3/uL — ABNORMAL LOW (ref 150–400)
WBC Count: 3.5 10*3/uL — ABNORMAL LOW (ref 4.0–10.5)
nRBC: 0 % (ref 0.0–0.2)

## 2022-04-20 LAB — CMP (CANCER CENTER ONLY)
ALT: 43 U/L (ref 0–44)
AST: 28 U/L (ref 15–41)
Albumin: 4.4 g/dL (ref 3.5–5.0)
Alkaline Phosphatase: 63 U/L (ref 38–126)
Anion gap: 3 — ABNORMAL LOW (ref 5–15)
BUN: 14 mg/dL (ref 6–20)
CO2: 30 mmol/L (ref 22–32)
Calcium: 9.5 mg/dL (ref 8.9–10.3)
Chloride: 105 mmol/L (ref 98–111)
Creatinine: 0.99 mg/dL (ref 0.61–1.24)
GFR, Estimated: >60 mL/min (ref >60)
Glucose, Bld: 109 mg/dL — ABNORMAL HIGH (ref 70–99)
Potassium: 4.6 mmol/L (ref 3.5–5.1)
Sodium: 138 mmol/L (ref 135–145)
Total Bilirubin: 0.9 mg/dL (ref 0.3–1.2)
Total Protein: 6.8 g/dL (ref 6.5–8.1)

## 2022-04-20 MED ORDER — LOMUSTINE 100 MG PO CAPS: 100.0000 mg/m2 | ORAL_CAPSULE | Freq: Once | ORAL | 0 refills | Status: DC

## 2022-04-20 NOTE — Telephone Encounter (Signed)
Called patient to schedule f/u appointment. Left voicemail with appointment information

## 2022-04-20 NOTE — Telephone Encounter (Signed)
Attempted to reach patient patient to advise that blood counts are approved to start [place].   Medication has been ordered and approved for start.   Left message for patient detailing above and to call if have any questions.

## 2022-04-20 NOTE — Progress Notes (Signed)
[place] at [place],  [address] [phone]   Interval Evaluation  Date of Service: 04/20/22 Patient Name: Robert Watkins Patient MRN: [id number] Patient DOB: 01-Oct-1985 Provider: Ventura Sellers, MD  Identifying Statement:  Robert Watkins is a 36 y.o. 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 Kristeen Miss in [place]. 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 [name]. Pathology confirms anaplastic astrocytoma.   06/01/2010 Clinical Event-Other  New patient evaluation at [facility]. Recommend radiation therapy with concurrent Temozolomide followed by twelve cycles of five-day Temozolomide.   06/13/2010 - 08/01/2010 Radiation  Radiation with Temodar.  06/13/2010 - 08/01/2010 Chemotherapy  Temodar 75 mg/m2 with radiation.  08/16/2010 - 08/07/2011 Chemotherapy  5 day Temodar 200 mg/m2  02/19/2018 Progression  Progression of non-enhancing tumor compared to 2013. Recommend initiating metronomic daily Temodar 50 mg/m2/day.  02/12/20 Progression Initiates Tibsovo '500mg'$  daily  10/21/20 Progression Adds oral CCNU '90mg'$ /m2 q6 weeks to daily Tibsovo  Interval History: Robert Watkins presents today for follow up after recent MRI brain.  No issues with the Lamictal, no new or progressive changes.  Continues to complain of modest fatigue.  No headaches or seizures.  He remains active and functionally independent. Continues to work full time.  Medications: Current Outpatient Medications on File Prior to Visit  Medication Sig Dispense Refill   lamoTRIgine (LAMICTAL) 100 MG tablet Take 1 tablet (100 mg total) by mouth  daily. 60 tablet 1   ivosidenib (TIBSOVO) 250 MG tablet Take 2 tablets (500 mg total) by mouth daily. (Patient not taking: Reported on 04/20/2022) 60 tablet 0   ondansetron (ZOFRAN) 8 MG tablet Take 1 tablet by mouth 2 times daily as needed. Start on the third day after chemotherapy. (Patient not taking: Reported on 11/17/2021) 30 tablet 1   No current facility-administered medications on file prior to visit.    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 Recommend radiation therapy with concurrent Temozolomide followed by twelve cycles of five-day Temozolomide. ?   Seizure disorder, secondary ([place]) 04/07/2011   Past Surgical History:  Past Surgical History:  Procedure Laterality Date   CRANIECTOMY / CRANIOTOMY FOR EXCISION OF BRAIN TUMOR Left 05/26/2010   Left parietooccipital craniotomy with tumor resection by Dr. [name]. Pathology confirms anaplastic astrocytoma. ?   CRANIOTOMY FOR STEREOTACTIC GUIDED SURGERY Left 05/12/2010   Stereotactic biopsy of left occipital lesion by Dr. Kristeen Miss in [place]. Pathology demonstrates anaplastic astrocytoma (WHO III)?   Social History:  Social History   Socioeconomic History   Marital status: Single    Spouse name: Not on file   Number of children: Not on file   Years of education: Not on file   Highest education level: Not on file  Occupational History   Not on file  Tobacco Use  Smoking status: Former   Smokeless tobacco: Never  Substance and Sexual Activity   Alcohol use: Yes    Alcohol/week: 4.0  standard drinks of alcohol    Types: 4 Shots of liquor per week   Drug use: Yes    Types: Marijuana   Sexual activity: Yes  Other Topics Concern   Not on file  Social History Narrative   Not on file   Social Determinants of Health   Financial Resource Strain: Not on file  Food Insecurity: Not on file  Transportation Needs: Not on file  Physical Activity: Not on file  Stress: Not on file  Social Connections: Not on file  Intimate Partner Violence: Not on file   Family History: No family history on file.  Review of Systems: Constitutional: Denies fevers, chills or abnormal weight loss Eyes: Denies blurriness of vision Ears, nose, mouth, throat, and face: Denies mucositis or sore throat Respiratory: Denies cough, dyspnea or wheezes Cardiovascular: Denies palpitation, chest discomfort or lower extremity swelling Gastrointestinal:  Denies nausea, constipation, diarrhea GU: Denies dysuria or incontinence Skin: Denies abnormal skin rashes Neurological: Per HPI Musculoskeletal: Denies joint pain, back or neck discomfort. No decrease in ROM Behavioral/Psych: Denies anxiety, disturbance in thought content, and mood instability  Physical Exam: Vitals:   04/20/22 0855  BP: 126/81  Pulse: 61  Resp: 16  Temp: 98.2 F (36.8 C)  SpO2: 100%    KPS: 90. General: Alert, cooperative, pleasant, in no acute distress Head: Normal EENT: No conjunctival injection or scleral icterus. Oral mucosa moist Lungs: Resp effort normal Cardiac: Regular rate and rhythm Abdomen: Soft, non-distended abdomen Skin: No rashes cyanosis or petechiae. Extremities: No clubbing or edema  Neurologic Exam: Mental Status: Awake, alert, attentive to examiner. Oriented to self and environment. Language is fluent with intact comprehension.  Cranial Nerves: Visual acuity is grossly normal. Visual fields are full. Extra-ocular movements intact. No ptosis. Face is symmetric, tongue midline. Motor: Tone and bulk  are normal. Power is full in both arms and legs. Reflexes are symmetric, no pathologic reflexes present. Intact finger to nose bilaterally Sensory: Intact to light touch and temperature Gait: Normal and tandem gait is normal.   Labs: I have reviewed the data as listed    Component Value Date/Time   NA 138 04/20/2022 0943   K 4.6 04/20/2022 0943   CL 105 04/20/2022 0943   CO2 30 04/20/2022 0943   GLUCOSE 109 (H) 04/20/2022 0943   BUN 14 04/20/2022 0943   CREATININE 0.99 04/20/2022 0943   CALCIUM 9.5 04/20/2022 0943   PROT 6.8 04/20/2022 0943   ALBUMIN 4.4 04/20/2022 0943   AST 28 04/20/2022 0943   ALT 43 04/20/2022 0943   ALKPHOS 63 04/20/2022 0943   BILITOT 0.9 04/20/2022 0943   GFRNONAA >60 04/20/2022 0943   GFRAA >60 08/19/2018 0854   Lab Results  Component Value Date   WBC 3.5 (L) 04/20/2022   NEUTROABS 2.7 04/20/2022   HGB 15.8 04/20/2022   HCT RESULTS UNAVAILABLE DUE TO INTERFERING SUBSTANCE 04/20/2022   MCV RESULTS UNAVAILABLE DUE TO INTERFERING SUBSTANCE 04/20/2022   PLT 123 (L) 04/20/2022    Imaging:  [place] Clinician Interpretation: I have personally reviewed the CNS images as listed.  My interpretation, in the context of the patient's clinical presentation, is progressive disease  MR Brain W Wo Contrast  Result Date: 04/15/2022 CLINICAL DATA:  36 year old male with a history of left occipital anaplastic astrocytoma (WHO grade III) originally diagnosed in 2012. Treated with surgery,  chemo radiation. Stable through January 2023. Progressive signal abnormality in the left thalamus suspicious for tumor recurrence in October. Restaging. EXAM: MRI HEAD WITHOUT AND WITH CONTRAST TECHNIQUE: Multiplanar, multiecho pulse sequences of the brain and surrounding structures were obtained without and with intravenous contrast. CONTRAST:  8 mL Vueway COMPARISON:  02/17/2022 and earlier. FINDINGS: Brain: Nodular up to 17 mm area of abnormal enhancement in the superior dorsal left  thalamus tracking somewhat laterally and inferiorly (series 15, image 90) is new since July, and progressed since October (series 15, image 90 today versus series 19, image 96 then). The abnormal enhancement is difficult to separate from the nearby choroid plexus of the left lateral ventricle which has otherwise not significantly changed in size or configuration since January. No definite abnormal subependymal enhancement. Chronic ex vacuo enlargement of the left atrium and occipital horn. No definite transependymal edema. Associated abnormal T2 and FLAIR hyperintensity, T2 shine through, and fairly isointense ADC signal at the left thalamus. No significant regional mass effect. Superimposed left occipital lobe treatment area with small resection cavity remains stable since January. No abnormal enhancement there. Stable postoperative hemosiderin. No superimposed midline shift, restricted diffusion to suggest acute infarction, or acute intracranial hemorrhage. Cervicomedullary junction and pituitary are within normal limits. Right hemisphere and corpus callosum gray and white matter signal remains normal. Posterior fossa signal remains normal. No dural thickening. Small anterior right frontal lobe developmental venous anomaly (normal variant). Vascular: Major intracranial vascular flow voids are stable. Following contrast the major dural venous sinuses are enhancing and appear patent. Skull and upper cervical spine: Stable craniotomy. No acute osseous abnormality identified. Negative visible cervical spine and spinal cord. Sinuses/Orbits: Negative. Other: Visible internal auditory structures appear normal. IMPRESSION: 1. Further progression of nodular enhancement in the Left Thalamus since October, highly suspicious for enlarging relatively high-grade tumor. Enhancement virtually inseparable from the adjacent left choroid plexus, but no evidence of ependymal dissemination of tumor at this time. Stable T2/FLAIR  hyperintensity.  No significant mass effect. 2. Stable and satisfactory post treatment appearance of the left occipital lobe. Electronically Signed   By: Genevie Ann M.D.   On: 04/15/2022 12:06     Assessment/Plan 1. Anaplastic astrocytoma [facility])  Robert Watkins is clinically stable today, now having resumed CCNU along with Tibsovo only.  Prior to this the CCNU had been held given 1 year of treatment and thrombocytopenia.  MRI brain, I feel, shows good response to therapy.  There may be slight increase in cross sectional diameter on T1-post axials, but coronal, saggital, and FLAIR are all clearly stable.  Given the quick change from July to October, this is more characteristic of stable disease.  We recommended resuming one additional cycle of CCNU, followed by another MRI.  He is agreeable with this.  We will also continue dosing Ivosidenib '500mg'$  daily given good control and tolerance.  Chemotherapy should be held for the following:  ANC less than 1,000  Platelets less than 100,000  LFT or creatinine greater than 2x ULN  If clinical concerns/contraindications develop  Will con't Lamictal '100mg'$  daily.  We ask that Robert Watkins return to clinic in 6 weeks following next brain MRI, or sooner as needed.  We appreciate the opportunity to participate in the care of Robert Watkins.    All questions were answered. The patient knows to call the clinic with any problems, questions or concerns. No barriers to learning were detected.  I have spent a total of 40 minutes of face-to-face and non-face-to-face time,  excluding clinical staff time, preparing to see patient, ordering tests and/or medications, counseling the patient, and independently interpreting results and communicating results to the patient/family/caregiver    Robert Sellers, MD Medical Director of Neuro-Oncology [facility] at [place] 04/20/22 11:05 AM

## 2022-04-21 ENCOUNTER — Other Ambulatory Visit: Payer: Self-pay

## 2022-04-25 ENCOUNTER — Other Ambulatory Visit: Payer: Managed Care, Other (non HMO)

## 2022-04-27 ENCOUNTER — Other Ambulatory Visit: Payer: Self-pay

## 2022-04-27 ENCOUNTER — Other Ambulatory Visit: Payer: Self-pay | Admitting: Internal Medicine

## 2022-04-27 DIAGNOSIS — C719 Malignant neoplasm of brain, unspecified: Secondary | ICD-10-CM

## 2022-04-27 MED ORDER — TIBSOVO 250 MG PO TABS: 500.0000 mg | ORAL_TABLET | Freq: Every day | ORAL | 0 refills | Status: DC

## 2022-05-01 ENCOUNTER — Ambulatory Visit: Payer: Managed Care, Other (non HMO) | Admitting: Internal Medicine

## 2022-05-01 ENCOUNTER — Inpatient Hospital Stay: Payer: Managed Care, Other (non HMO)

## 2022-05-03 ENCOUNTER — Encounter: Payer: Self-pay | Admitting: Internal Medicine

## 2022-05-03 ENCOUNTER — Other Ambulatory Visit: Payer: Self-pay | Admitting: Pharmacist

## 2022-05-03 ENCOUNTER — Telehealth: Payer: Self-pay | Admitting: Pharmacy Technician

## 2022-05-03 ENCOUNTER — Other Ambulatory Visit (HOSPITAL_COMMUNITY): Payer: Self-pay

## 2022-05-03 DIAGNOSIS — C719 Malignant neoplasm of brain, unspecified: Secondary | ICD-10-CM

## 2022-05-03 MED ORDER — LOMUSTINE 100 MG PO CAPS: 100.0000 mg/m2 | ORAL_CAPSULE | Freq: Once | ORAL | 0 refills | Status: DC

## 2022-05-03 NOTE — Telephone Encounter (Signed)
Oral Oncology Patient Advocate Encounter  After completing a benefits investigation, prior authorization for Gleostine is not required at this time through [employer].  [name] copay is $0.     Lady Deutscher, CPhT-Adv Oncology Pharmacy Patient [place] Direct Number: [phone]  Fax: [phone]

## 2022-05-03 NOTE — Progress Notes (Addendum)
Oral Oncology Pharmacist Encounter  Notified by [facility] that patient no longer is able to fill Gleostine (lomustine) through their pharmacy due to insurance change. Confirmed Rx sent to [facility] was not filled when initially sent on 04/20/22. Prescription redirected to [facility] for dispensing. Patient made aware.   ADDENDUM 05/05/2022 Oral chemo clinic notified on 05/04/22 that [facility] was unable to obtain gleostine for patient. Rx has been redirected to [facility] for future fills. [facility]ine Rx canceled [facility].   Leron Croak, PharmD, BCPS, [facility] Hematology/Oncology Clinical Pharmacist [facility] [phone] 05/03/2022 9:39 AM

## 2022-05-04 ENCOUNTER — Other Ambulatory Visit (HOSPITAL_COMMUNITY): Payer: Self-pay

## 2022-05-04 ENCOUNTER — Telehealth: Payer: Self-pay

## 2022-05-04 ENCOUNTER — Other Ambulatory Visit: Payer: Self-pay

## 2022-05-04 DIAGNOSIS — C719 Malignant neoplasm of brain, unspecified: Secondary | ICD-10-CM

## 2022-05-04 MED ORDER — LOMUSTINE 100 MG PO CAPS
100.0000 mg/m2 | ORAL_CAPSULE | Freq: Once | ORAL | 0 refills | Status: AC
  Filled 2022-05-04: qty 2, 42d supply, fill #0

## 2022-05-05 ENCOUNTER — Other Ambulatory Visit (HOSPITAL_COMMUNITY): Payer: Self-pay

## 2022-05-05 NOTE — Telephone Encounter (Signed)
Oral Chemotherapy Pharmacist Encounter   Patient is able to fill the [place] [facility] long outpatient pharmacy and patient is having trouble receiving the [facility]. Prescription rerouted to [facility] for fill. Patient received phone call and was very appreciative for the help.  Drema Halon, PharmD Hematology/Oncology Clinical Pharmacist [facility] [phone]

## 2022-05-15 ENCOUNTER — Other Ambulatory Visit (HOSPITAL_COMMUNITY): Payer: Self-pay

## 2022-05-29 ENCOUNTER — Telehealth: Payer: Self-pay | Admitting: *Deleted

## 2022-05-29 DIAGNOSIS — C719 Malignant neoplasm of brain, unspecified: Secondary | ICD-10-CM

## 2022-05-29 MED ORDER — TIBSOVO 250 MG PO TABS: 500.0000 mg | ORAL_TABLET | Freq: Every day | ORAL | 0 refills | Status: DC

## 2022-05-29 NOTE — Addendum Note (Signed)
Addended by: Ventura Sellers on: 05/29/2022 11:58 AM   Modules accepted: Orders

## 2022-05-29 NOTE — Telephone Encounter (Signed)
[facility] called to see if they could get a refill of patients Tibsovo.    Routed to Dr Mickeal Skinner.

## 2022-06-01 ENCOUNTER — Ambulatory Visit
Admission: RE | Admit: 2022-06-01 | Discharge: 2022-06-01 | Disposition: A | Discharge status: Home / Self Care | Payer: Managed Care, Other (non HMO) | Source: Ambulatory Visit | Attending: Internal Medicine | Admitting: Internal Medicine

## 2022-06-01 DIAGNOSIS — C719 Malignant neoplasm of brain, unspecified: Secondary | ICD-10-CM

## 2022-06-01 MED ORDER — GADOPICLENOL 0.5 MMOL/ML IV SOLN
8.0000 mL | Freq: Once | INTRAVENOUS | Status: AC | PRN
  Administered 2022-06-01: 8 mL via INTRAVENOUS

## 2022-06-05 ENCOUNTER — Inpatient Hospital Stay: Payer: Managed Care, Other (non HMO)

## 2022-06-05 ENCOUNTER — Other Ambulatory Visit: Payer: Self-pay

## 2022-06-05 ENCOUNTER — Inpatient Hospital Stay (HOSPITAL_BASED_OUTPATIENT_CLINIC_OR_DEPARTMENT_OTHER): Payer: Managed Care, Other (non HMO) | Admitting: Internal Medicine

## 2022-06-05 ENCOUNTER — Other Ambulatory Visit (HOSPITAL_COMMUNITY): Payer: Self-pay

## 2022-06-05 ENCOUNTER — Inpatient Hospital Stay: Payer: Managed Care, Other (non HMO) | Attending: Internal Medicine

## 2022-06-05 VITALS — BP 123/54 | HR 54 | Temp 98.1°F | Resp 14 | Wt 179.4 lb

## 2022-06-05 DIAGNOSIS — C719 Malignant neoplasm of brain, unspecified: Secondary | ICD-10-CM

## 2022-06-05 DIAGNOSIS — Z923 Personal history of irradiation: Secondary | ICD-10-CM | POA: Insufficient documentation

## 2022-06-05 DIAGNOSIS — Z79899 Other long term (current) drug therapy: Secondary | ICD-10-CM | POA: Diagnosis not present

## 2022-06-05 DIAGNOSIS — C714 Malignant neoplasm of occipital lobe: Secondary | ICD-10-CM | POA: Diagnosis present

## 2022-06-05 DIAGNOSIS — G40909 Epilepsy, unspecified, not intractable, without status epilepticus: Secondary | ICD-10-CM | POA: Diagnosis not present

## 2022-06-05 DIAGNOSIS — Z87891 Personal history of nicotine dependence: Secondary | ICD-10-CM | POA: Insufficient documentation

## 2022-06-05 LAB — CBC WITH DIFFERENTIAL (CANCER CENTER ONLY)
Abs Immature Granulocytes: 0.01 10*3/uL (ref 0.00–0.07)
Basophils Absolute: 0 10*3/uL (ref 0.0–0.1)
Basophils Relative: 0 %
Eosinophils Absolute: 0 10*3/uL (ref 0.0–0.5)
Eosinophils Relative: 1 %
HCT: 38.7 % — ABNORMAL LOW (ref 39.0–52.0)
Hemoglobin: 14.4 g/dL (ref 13.0–17.0)
Immature Granulocytes: 0 %
Lymphocytes Relative: 12 %
Lymphs Abs: 0.4 10*3/uL — ABNORMAL LOW (ref 0.7–4.0)
MCH: 37 pg — ABNORMAL HIGH (ref 26.0–34.0)
MCHC: 37.2 g/dL — ABNORMAL HIGH (ref 30.0–36.0)
MCV: 99.5 fL (ref 80.0–100.0)
Monocytes Absolute: 0.2 10*3/uL (ref 0.1–1.0)
Monocytes Relative: 6 %
Neutro Abs: 2.9 10*3/uL (ref 1.7–7.7)
Neutrophils Relative %: 81 %
Platelet Count: 112 10*3/uL — ABNORMAL LOW (ref 150–400)
RBC: 3.89 MIL/uL — ABNORMAL LOW (ref 4.22–5.81)
RDW: 12.6 % (ref 11.5–15.5)
WBC Count: 3.7 10*3/uL — ABNORMAL LOW (ref 4.0–10.5)
nRBC: 0 % (ref 0.0–0.2)

## 2022-06-05 LAB — CMP (CANCER CENTER ONLY)
ALT: 38 U/L (ref 0–44)
AST: 27 U/L (ref 15–41)
Albumin: 4.4 g/dL (ref 3.5–5.0)
Alkaline Phosphatase: 62 U/L (ref 38–126)
Anion gap: 3 — ABNORMAL LOW (ref 5–15)
BUN: 17 mg/dL (ref 6–20)
CO2: 28 mmol/L (ref 22–32)
Calcium: 9.6 mg/dL (ref 8.9–10.3)
Chloride: 110 mmol/L (ref 98–111)
Creatinine: 1.07 mg/dL (ref 0.61–1.24)
GFR, Estimated: >60 mL/min (ref >60)
Glucose, Bld: 108 mg/dL — ABNORMAL HIGH (ref 70–99)
Potassium: 4.9 mmol/L (ref 3.5–5.1)
Sodium: 141 mmol/L (ref 135–145)
Total Bilirubin: 0.6 mg/dL (ref 0.3–1.2)
Total Protein: 6.5 g/dL (ref 6.5–8.1)

## 2022-06-05 MED ORDER — LOMUSTINE 100 MG PO CAPS
100.0000 mg/m2 | ORAL_CAPSULE | Freq: Once | ORAL | 0 refills | Status: AC
  Filled 2022-06-05 – 2022-06-08 (×2): qty 2, 1d supply, fill #0

## 2022-06-05 NOTE — Progress Notes (Signed)
[facility] at [place], [place] [address] [phone]   Interval Evaluation  Date of Service: 06/05/22 Patient Name: Robert Watkins Patient MRN: [id number] Patient DOB: 16-Dec-1985 Provider: Ventura Sellers, MD  Identifying Statement:  Martis Dommer is a 37 y.o. 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 Kristeen Miss in [place]. 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 [name]. Pathology confirms anaplastic astrocytoma.   06/01/2010 Clinical Event-Other  New patient evaluation at [facility]. Recommend radiation therapy with concurrent Temozolomide followed by twelve cycles of five-day Temozolomide.   06/13/2010 - 08/01/2010 Radiation  Radiation with Temodar.  06/13/2010 - 08/01/2010 Chemotherapy  Temodar 75 mg/m2 with radiation.  08/16/2010 - 08/07/2011 Chemotherapy  5 day Temodar 200 mg/m2  02/19/2018 Progression  Progression of non-enhancing tumor compared to 2013. Recommend initiating metronomic daily Temodar 50 mg/m2/day.  02/12/20 Progression Initiates Tibsovo 566m daily  10/21/20 Progression Adds oral CCNU 949mm2 q6 weeks to daily Tibsovo  Interval History: NoAvrohom Mcelveenresents today for follow up after recent MRI brain.  No clinical changes today.  Continues to complain of modest fatigue.  No headaches or seizures.  He remains active and functionally independent. Continues to work full time.  Medications: Current Outpatient Medications on File Prior to Visit  Medication Sig Dispense Refill   ivosidenib (TIBSOVO) 250 MG tablet Take 2 tablets (500 mg total) by mouth daily. 60 tablet 0   lamoTRIgine  (LAMICTAL) 100 MG tablet Take 1 tablet (100 mg total) by mouth daily. 60 tablet 1   ondansetron (ZOFRAN) 8 MG tablet Take 1 tablet by mouth 2 times daily as needed. Start on the third day after chemotherapy. 30 tablet 1   No current facility-administered medications on file prior to visit.    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 ?   Seizure disorder, secondary ([facility]/14/2012   Past Surgical History:  Past Surgical History:  Procedure Laterality Date   CRANIECTOMY / CRANIOTOMY FOR EXCISION OF BRAIN TUMOR Left 05/26/2010   Left parietooccipital craniotomy with tumor resection by Dr. AlDerrill MemoPathology confirms anaplastic astrocytoma. ?   CRANIOTOMY FOR STEREOTACTIC GUIDED SURGERY Left 05/12/2010   Stereotactic biopsy of left occipital lesion by Dr. [facility]risteen [facility]n [facility] demonstrates anaplastic astrocytoma (WHO III)?   Social History:  Social History   Socioeconomic History   Marital status: Single    Spouse name: Not on file   Number of children: Not on file   Years of education: Not on file   Highest education level: Not on file  Occupational History   Not on file  Tobacco Use   Smoking status: Former   Smokeless tobacco: Never  Substance and Sexual Activity   Alcohol use: Yes    Alcohol/week: 4.0 standard drinks of alcohol    Types: 4 Shots of liquor per week   Drug use: Yes     Types: Marijuana   Sexual activity: Yes  Other Topics Concern   Not on file  Social History Narrative   Not on file   Social Determinants of Health   Financial Resource Strain: Not on file  Food Insecurity: Not on file  Transportation Needs: Not on file  Physical Activity: Not on file  Stress: Not on file  Social Connections: Not on file  Intimate Partner Violence: Not on file   Family History: No family history on file.  Review of Systems: Constitutional: Denies fevers, chills or abnormal weight loss Eyes: Denies blurriness of vision Ears, nose, mouth, throat, and face: Denies mucositis or sore throat Respiratory: Denies cough, dyspnea or wheezes Cardiovascular: Denies palpitation, chest discomfort or lower extremity swelling Gastrointestinal:  Denies nausea, constipation, diarrhea GU: Denies dysuria or incontinence Skin: Denies abnormal skin rashes Neurological: Per HPI Musculoskeletal: Denies joint pain, back or neck discomfort. No decrease in ROM Behavioral/Psych: Denies anxiety, disturbance in thought content, and mood instability  Physical Exam: Vitals:   06/05/22 1023  BP: (!) 123/54  Pulse: (!) 54  Resp: 14  Temp: 98.1 F (36.7 C)  SpO2: 100%    KPS: 90. General: Alert, cooperative, pleasant, in no acute distress Head: Normal EENT: No conjunctival injection or scleral icterus. Oral mucosa moist Lungs: Resp effort normal Cardiac: Regular rate and rhythm Abdomen: Soft, non-distended abdomen Skin: No rashes cyanosis or petechiae. Extremities: No clubbing or edema  Neurologic Exam: Mental Status: Awake, alert, attentive to examiner. Oriented to self and environment. Language is fluent with intact comprehension.  Cranial Nerves: Visual acuity is grossly normal. Visual fields are full. Extra-ocular movements intact. No ptosis. Face is symmetric, tongue midline. Motor: Tone and bulk are normal. Power is full in both arms and legs. Reflexes are symmetric, no  pathologic reflexes present. Intact finger to nose bilaterally Sensory: Intact to light touch and temperature Gait: Normal and tandem gait is normal.   Labs: I have reviewed the data as listed    Component Value Date/Time   NA 138 04/20/2022 0943   K 4.6 04/20/2022 0943   CL 105 04/20/2022 0943   CO2 30 04/20/2022 0943   GLUCOSE 109 (H) 04/20/2022 0943   BUN 14 04/20/2022 0943   CREATININE 0.99 04/20/2022 0943   CALCIUM 9.5 04/20/2022 0943   PROT 6.8 04/20/2022 0943   ALBUMIN 4.4 04/20/2022 0943   AST 28 04/20/2022 0943   ALT 43 04/20/2022 0943   ALKPHOS 63 04/20/2022 0943   BILITOT 0.9 04/20/2022 0943   GFRNONAA >60 04/20/2022 0943   GFRAA >60 08/19/2018 0854   Lab Results  Component Value Date   WBC 3.5 (L) 04/20/2022   NEUTROABS 2.7 04/20/2022   HGB 15.8 04/20/2022   HCT RESULTS UNAVAILABLE DUE TO INTERFERING SUBSTANCE 04/20/2022   MCV RESULTS UNAVAILABLE DUE TO INTERFERING SUBSTANCE 04/20/2022   PLT 123 (L) 04/20/2022    Imaging:  [place] Clinician Interpretation: I have personally reviewed the CNS images as listed.  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 Treated with surgery, chemo radiation.  Stable through January 2023. Progressive signal abnormality in the left thalamus suspicious for tumor recurrence in October. Restaging. EXAM: MRI HEAD WITHOUT AND WITH CONTRAST TECHNIQUE: Multiplanar, multiecho pulse sequences of the brain and surrounding structures were obtained without and with intravenous contrast. CONTRAST:  9 cc of few intravenous COMPARISON:  04/12/2022 FINDINGS: Brain: Infiltrating T2 signal at the left occipital resection site and medial left thalamus. The heterogeneous enhancing nodular  area at the upper and posterior left thalamus has similar dimensions at these core but there is increased stippled enhancement at the inferior and left lateral aspects of the lesion as marked on 16:86 and 91. No new areas of enhancement or T2 signal. No visible ependymal tumor spread. Mineralization in the deep gray nuclei from treatment or surveillance. Chronic microhemorrhages mainly around the treatment area likely related to patient's very remote radiation history. No acute infarct, acute hemorrhage, hydrocephalus, or collection. Vascular: Major flow voids and vascular enhancements are preserved Skull and upper cervical spine: Unremarkable craniotomy site Sinuses/Orbits: Negative IMPRESSION: Minimal progression of enhancement at the inferior and lateral margins of the enhancing left thalamic lesion. No change in the infiltrating T2 signal around the resection site. Electronically Signed   By: Jorje Guild M.D.   On: 06/04/2022 07:18     Assessment/Plan 1. Anaplastic astrocytoma [facility])  Mr. Trester is clinically stable today, now having completed 2 cycles of (resumed) CCNU along with Tibsovo only.  Prior to this the CCNU had been held given 1 year of treatment and thrombocytopenia.  MRI brain demonstrates mostly stable findings.  There is slight increase in enhancement and T2 signal within lateral aspect of thalamic lesion, does not meet criteria for PD.  We will keep a close eye on it.  We recommended resuming one additional cycle of CCNU (#3), followed by another MRI.  He is agreeable with this.  We will also continue dosing Ivosidenib 567m daily given good control and tolerance.  Chemotherapy should be held for the following:  ANC less than 1,000  Platelets less than 100,000  LFT or creatinine greater than 2x ULN  If clinical concerns/contraindications develop  Will con't Lamictal 1065mdaily.  We ask that NoRayyan Mellgreneturn to clinic in 6 weeks following next brain MRI, or sooner as  needed.  We appreciate the opportunity to participate in the care of NoLatrail Qamar   All questions were answered. The patient knows to call the clinic with any problems, questions or concerns. No barriers to learning were detected.  I have spent a total of 40 minutes of face-to-face and non-face-to-face time, excluding clinical staff time, preparing to see patient, ordering tests and/or medications, counseling the patient, and independently interpreting results and communicating results to the patient/family/caregiver    ZaVentura SellersMD Medical Director of Neuro-Oncology [facility] WeHolland2/12/24 10:33 AM

## 2022-06-06 ENCOUNTER — Other Ambulatory Visit: Payer: Self-pay

## 2022-06-08 ENCOUNTER — Other Ambulatory Visit: Payer: Self-pay

## 2022-06-08 ENCOUNTER — Other Ambulatory Visit (HOSPITAL_COMMUNITY): Payer: Self-pay

## 2022-06-12 ENCOUNTER — Other Ambulatory Visit: Payer: Self-pay | Admitting: Radiation Therapy

## 2022-06-12 ENCOUNTER — Other Ambulatory Visit: Payer: Self-pay

## 2022-06-19 ENCOUNTER — Telehealth: Payer: Self-pay | Admitting: *Deleted

## 2022-06-19 DIAGNOSIS — C719 Malignant neoplasm of brain, unspecified: Secondary | ICD-10-CM

## 2022-06-19 MED ORDER — TIBSOVO 250 MG PO TABS: 500.0000 mg | ORAL_TABLET | Freq: Every day | ORAL | 0 refills | Status: DC

## 2022-06-19 MED ORDER — DIAZEPAM 5 MG PO TABS: 5.0000 mg | ORAL_TABLET | Freq: Four times a day (QID) | ORAL | 0 refills | Status: DC | PRN

## 2022-06-19 NOTE — Telephone Encounter (Signed)
Attempted to reach patient to communicate new Rx.  Left brief message about refill and to call if patient had any further questions.

## 2022-06-19 NOTE — Telephone Encounter (Signed)
Patient is going to travel to [place] for March 7th through March 23rd and he will need his Tibsovo refilled now so that he will have enough for the trip.    He said he will also be dosing his CCNU the week before and has zofran available.    He is also asking for something for his nerves for the travel on the airplane.    Routing to Dr Mickeal Skinner.

## 2022-06-19 NOTE — Addendum Note (Signed)
Addended by: Ventura Sellers on: 06/19/2022 01:43 PM   Modules accepted: Orders

## 2022-06-27 ENCOUNTER — Telehealth: Payer: Self-pay | Admitting: *Deleted

## 2022-06-27 MED ORDER — AMOXICILLIN 500 MG PO TABS: 500.0000 mg | ORAL_TABLET | Freq: Two times a day (BID) | ORAL | 0 refills | Status: DC

## 2022-06-27 NOTE — Telephone Encounter (Signed)
Patient called concerned about exposure to strep throat from his nieces and his future travel outside of the country.  He is asking if Dr Mickeal Skinner would consider prescribing a prescription for Amoxicillin for him to have on hand if he were to start to have symptoms of strep while he is out of the country.

## 2022-06-27 NOTE — Addendum Note (Signed)
Addended by: Ventura Sellers on: 06/27/2022 09:37 AM   Modules accepted: Orders

## 2022-06-27 NOTE — Telephone Encounter (Signed)
Communicated response via voice mail.  Instructed patient to call back if should have any questions.

## 2022-06-29 ENCOUNTER — Telehealth: Payer: Self-pay | Admitting: Internal Medicine

## 2022-06-29 NOTE — Telephone Encounter (Signed)
Called patient regarding upcoming February appointments, left a voicemail.

## 2022-07-19 ENCOUNTER — Telehealth: Payer: Self-pay | Admitting: Internal Medicine

## 2022-07-19 NOTE — Telephone Encounter (Signed)
Called patient regarding April appointments, patient is notified. 

## 2022-07-20 ENCOUNTER — Other Ambulatory Visit (HOSPITAL_COMMUNITY): Payer: Self-pay

## 2022-07-25 ENCOUNTER — Other Ambulatory Visit: Payer: Self-pay

## 2022-07-27 ENCOUNTER — Telehealth: Payer: Self-pay

## 2022-07-27 DIAGNOSIS — C719 Malignant neoplasm of brain, unspecified: Secondary | ICD-10-CM

## 2022-07-27 MED ORDER — TIBSOVO 250 MG PO TABS: 500.0000 mg | ORAL_TABLET | Freq: Every day | ORAL | 0 refills | Status: DC

## 2022-07-27 NOTE — Telephone Encounter (Signed)
T/C received from [facility] requesting a refill for Tibsovo 250 mg  Two tablets PO daily  Request sent to Dr Mickeal Skinner

## 2022-07-27 NOTE — Addendum Note (Signed)
Addended by: Ventura Sellers on: 07/27/2022 12:24 PM   Modules accepted: Orders

## 2022-08-03 ENCOUNTER — Ambulatory Visit
Admission: RE | Admit: 2022-08-03 | Discharge: 2022-08-03 | Disposition: A | Discharge status: Home / Self Care | Payer: Managed Care, Other (non HMO) | Source: Ambulatory Visit | Attending: Internal Medicine | Admitting: Internal Medicine

## 2022-08-03 DIAGNOSIS — C719 Malignant neoplasm of brain, unspecified: Secondary | ICD-10-CM

## 2022-08-03 MED ORDER — GADOPICLENOL 0.5 MMOL/ML IV SOLN
8.0000 mL | Freq: Once | INTRAVENOUS | Status: AC | PRN
  Administered 2022-08-03: 8 mL via INTRAVENOUS

## 2022-08-07 ENCOUNTER — Inpatient Hospital Stay: Payer: Managed Care, Other (non HMO)

## 2022-08-08 ENCOUNTER — Inpatient Hospital Stay (HOSPITAL_BASED_OUTPATIENT_CLINIC_OR_DEPARTMENT_OTHER): Payer: Managed Care, Other (non HMO) | Admitting: Internal Medicine

## 2022-08-08 ENCOUNTER — Inpatient Hospital Stay: Payer: Managed Care, Other (non HMO) | Attending: Internal Medicine

## 2022-08-08 ENCOUNTER — Other Ambulatory Visit (HOSPITAL_COMMUNITY): Payer: Self-pay

## 2022-08-08 VITALS — BP 116/68 | HR 98 | Temp 97.9°F | Resp 15 | Ht 71.0 in | Wt 173.5 lb

## 2022-08-08 DIAGNOSIS — Z9221 Personal history of antineoplastic chemotherapy: Secondary | ICD-10-CM | POA: Insufficient documentation

## 2022-08-08 DIAGNOSIS — G40909 Epilepsy, unspecified, not intractable, without status epilepticus: Secondary | ICD-10-CM | POA: Diagnosis not present

## 2022-08-08 DIAGNOSIS — Z87891 Personal history of nicotine dependence: Secondary | ICD-10-CM | POA: Diagnosis not present

## 2022-08-08 DIAGNOSIS — Z79899 Other long term (current) drug therapy: Secondary | ICD-10-CM | POA: Diagnosis not present

## 2022-08-08 DIAGNOSIS — C719 Malignant neoplasm of brain, unspecified: Secondary | ICD-10-CM

## 2022-08-08 DIAGNOSIS — C714 Malignant neoplasm of occipital lobe: Secondary | ICD-10-CM | POA: Insufficient documentation

## 2022-08-08 LAB — CMP (CANCER CENTER ONLY)
ALT: 47 U/L — ABNORMAL HIGH (ref 0–44)
AST: 55 U/L — ABNORMAL HIGH (ref 15–41)
Albumin: 4.3 g/dL (ref 3.5–5.0)
Alkaline Phosphatase: 53 U/L (ref 38–126)
Anion gap: 4 — ABNORMAL LOW (ref 5–15)
BUN: 20 mg/dL (ref 6–20)
CO2: 29 mmol/L (ref 22–32)
Calcium: 9.4 mg/dL (ref 8.9–10.3)
Chloride: 108 mmol/L (ref 98–111)
Creatinine: 1.09 mg/dL (ref 0.61–1.24)
GFR, Estimated: >60 mL/min (ref >60)
Glucose, Bld: 98 mg/dL (ref 70–99)
Potassium: 4.2 mmol/L (ref 3.5–5.1)
Sodium: 141 mmol/L (ref 135–145)
Total Bilirubin: 0.9 mg/dL (ref 0.3–1.2)
Total Protein: 6.4 g/dL — ABNORMAL LOW (ref 6.5–8.1)

## 2022-08-08 LAB — CBC WITH DIFFERENTIAL (CANCER CENTER ONLY)
Abs Immature Granulocytes: 0.01 10*3/uL (ref 0.00–0.07)
Basophils Absolute: 0 10*3/uL (ref 0.0–0.1)
Basophils Relative: 0 %
Eosinophils Absolute: 0.1 10*3/uL (ref 0.0–0.5)
Eosinophils Relative: 2 %
Hemoglobin: 13.2 g/dL (ref 13.0–17.0)
Immature Granulocytes: 0 %
Lymphocytes Relative: 15 %
Lymphs Abs: 0.5 10*3/uL — ABNORMAL LOW (ref 0.7–4.0)
Monocytes Absolute: 0.2 10*3/uL (ref 0.1–1.0)
Monocytes Relative: 6 %
Neutro Abs: 2.3 10*3/uL (ref 1.7–7.7)
Neutrophils Relative %: 77 %
Platelet Count: 117 10*3/uL — ABNORMAL LOW (ref 150–400)
WBC Count: 3 10*3/uL — ABNORMAL LOW (ref 4.0–10.5)

## 2022-08-08 NOTE — Progress Notes (Signed)
[facility] [address]  [place], [place] [address] [phone]   Interval Evaluation  Date of Service: 08/08/22 Patient Name: Robert Watkins Patient MRN: [id number] Patient DOB: Jan 07, 1986 Provider: Henreitta Leber, MD  Identifying Statement:  Robert Watkins is a 37 y.o. 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 Barnett Abu in [place]. 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 Lavonna Monarch. Pathology confirms anaplastic astrocytoma.   06/01/2010 Clinical Event-Other  New patient evaluation at [facility]. Recommend radiation therapy with concurrent Temozolomide followed by twelve cycles of five-day Temozolomide.   06/13/2010 - 08/01/2010 Radiation  Radiation with Temodar.  06/13/2010 - 08/01/2010 Chemotherapy  Temodar 75 mg/m2 with radiation.  08/16/2010 - 08/07/2011 Chemotherapy  5 day Temodar 200 mg/m2  02/19/2018 Progression  Progression of non-enhancing tumor compared to 2013. Recommend initiating metronomic daily Temodar 50 mg/m2/day.  02/12/20 Progression Initiates Tibsovo  daily  10/21/20 Progression Adds oral CCNU /m2 q6 weeks to daily Tibsovo  Interval History: Robert Watkins presents today for follow up after recent MRI brain.  No clinical changes today.  Burden of fatigue is stable.  No headaches or seizures.  He remains active and functionally independent. Continues to work full time.  Medications: Current Outpatient Medications on File Prior to Visit  Medication Sig Dispense Refill   ivosidenib (TIBSOVO) 250 MG tablet Take 2 tablets (500 mg total) by mouth daily. 60 tablet 0   lamoTRIgine (LAMICTAL) 100  MG tablet Take 1 tablet (100 mg total) by mouth daily. 60 tablet 1   amoxicillin (AMOXIL) 500 MG tablet Take 1 tablet (500 mg total) by mouth 2 (two) times daily. (Patient not taking: Reported on 08/08/2022) 20 tablet 0   diazepam (VALIUM) 5 MG tablet Take 1 tablet (5 mg total) by mouth every 6 (six) hours as needed for anxiety. (Patient not taking: Reported on 08/08/2022) 10 tablet 0   ondansetron (ZOFRAN) 8 MG tablet Take 1 tablet by mouth 2 times daily as needed. Start on the third day after chemotherapy. (Patient not taking: Reported on 08/08/2022) 30 tablet 1   No current facility-administered medications on file prior to visit.    Allergies:  Allergies  Allergen Reactions   Lamotrigine Rash    Currently taking with no problems   Past Medical History:  Past Medical History:  Diagnosis Date   Cancer of brain (HCC) 04/07/2011   Depression, reactive 04/07/2011   History of chemotherapy 06/13/2010-08/01/2010   42 day Temodar with concurrent Radiation   History of chemotherapy 08/16/2010-08/07/2011   5 day Temodar 200 mg/m2   History of radiation therapy 06/13/2010-08/01/2010   [facility]. Recommend radiation therapy with concurrent Temozolomide followed by twelve cycles of five-day Temozolomide. ?   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 Lavonna Monarch. Pathology confirms anaplastic astrocytoma. ?   CRANIOTOMY FOR STEREOTACTIC GUIDED SURGERY Left 05/12/2010   Stereotactic biopsy of left occipital lesion by Dr. Barnett Abu in [place]. Pathology demonstrates anaplastic astrocytoma (WHO III)?   Social History:  Social History   Socioeconomic History   Marital status: Single    Spouse name: Not on file   Number of children:  Not on file   Years of education: Not on file   Highest education level: Not on file  Occupational History   Not on file  Tobacco Use   Smoking status: Former   Smokeless tobacco: Never  Substance and Sexual Activity   Alcohol use: Yes    Alcohol/week: 4.0 standard drinks of alcohol    Types: 4 Shots of liquor per week   Drug use: Yes    Types: Marijuana   Sexual activity: Yes  Other Topics Concern   Not on file  Social History Narrative   Not on file   Social Determinants of Health   Financial Resource Strain: Not on file  Food Insecurity: Not on file  Transportation Needs: Not on file  Physical Activity: Not on file  Stress: Not on file  Social Connections: Not on file  Intimate Partner Violence: Not on file   Family History: No family history on file.  Review of Systems: Constitutional: Denies fevers, chills or abnormal weight loss Eyes: Denies blurriness of vision Ears, nose, mouth, throat, and face: Denies mucositis or sore throat Respiratory: Denies cough, dyspnea or wheezes Cardiovascular: Denies palpitation, chest discomfort or lower extremity swelling Gastrointestinal:  Denies nausea, constipation, diarrhea GU: Denies dysuria or incontinence Skin: Denies abnormal skin rashes Neurological: Per HPI Musculoskeletal: Denies joint pain, back or neck discomfort. No decrease in ROM Behavioral/Psych: Denies anxiety, disturbance in thought content, and mood instability  Physical Exam: Vitals:   08/08/22 1115  BP: 116/68  Pulse: 98  Resp: 15  Temp: 97.9 F (36.6 C)  SpO2: 100%    KPS: 90. General: Alert, cooperative, pleasant, in no acute distress Head: Normal EENT: No conjunctival injection or scleral icterus. Oral mucosa moist Lungs: Resp effort normal Cardiac: Regular rate and rhythm Abdomen: Soft, non-distended abdomen Skin: No rashes cyanosis or petechiae. Extremities: No clubbing or edema  Neurologic Exam: Mental Status: Awake, alert, attentive  to examiner. Oriented to self and environment. Language is fluent with intact comprehension.  Cranial Nerves: Visual acuity is grossly normal. Visual fields are full. Extra-ocular movements intact. No ptosis. Face is symmetric, tongue midline. Motor: Tone and bulk are normal. Power is full in both arms and legs. Reflexes are symmetric, no pathologic reflexes present. Intact finger to nose bilaterally Sensory: Intact to light touch and temperature Gait: Normal and tandem gait is normal.   Labs: I have reviewed the data as listed    Component Value Date/Time   NA 141 06/05/2022 1001   K 4.9 06/05/2022 1001   CL 110 06/05/2022 1001   CO2 28 06/05/2022 1001   GLUCOSE 108 (H) 06/05/2022 1001   BUN 17 06/05/2022 1001   CREATININE 1.07 06/05/2022 1001   CALCIUM 9.6 06/05/2022 1001   PROT 6.5 06/05/2022 1001   ALBUMIN 4.4 06/05/2022 1001   AST 27 06/05/2022 1001   ALT 38 06/05/2022 1001   ALKPHOS 62 06/05/2022 1001   BILITOT 0.6 06/05/2022 1001   GFRNONAA >60 06/05/2022 1001   GFRAA >60 08/19/2018 0854   Lab Results  Component Value Date   WBC 3.7 (L) 06/05/2022   NEUTROABS 2.9 06/05/2022   HGB 14.4 06/05/2022   HCT 38.7 (L) 06/05/2022   MCV 99.5 06/05/2022   PLT 112 (L) 06/05/2022    Imaging:  [facility] Clinician Interpretation: I have personally reviewed the CNS images as listed.  My interpretation, in the context of the patient's clinical presentation, is stable disease  MR BRAIN W WO CONTRAST  Result Date: 08/06/2022 CLINICAL DATA:  Anaplastic astrocytoma progressive signal abnormality in the left thalamus concerning for recent tumor recurrence. Restaging. EXAM: MRI HEAD WITHOUT AND WITH CONTRAST TECHNIQUE: Multiplanar, multiecho pulse sequences of the brain and surrounding structures were obtained without and with intravenous contrast. CONTRAST:  8 mL Vueway COMPARISON:  Multiple prior MRI of the head without and with contrast including 06/01/2022, 04/12/2022 and 02/17/2022  FINDINGS: Brain: The enhancing lesion in extending superiorly from the posterior left thalamus demonstrates significant increase in size since the prior exam. It now measures 28 x 21 x 14 mm (AP x CC x TR). The associated T2 signal changes have also progressed. Early extension of the posterior limb of the internal capsule is noted. Previously treated tumor in the left occipital lobe is stable. No distal lesions are present. No acute infarct is present. Ventricles are of normal size. No significant extra-axial fluid collection is present. The brainstem and cerebellum are within normal limits. The internal auditory canals are within normal limits. Midline structures are within normal limits. Vascular: Flow is present in the major intracranial arteries. Skull and upper cervical spine: The craniocervical junction is normal. Upper cervical spine is within normal limits. Marrow signal is unremarkable. Sinuses/Orbits: The paranasal sinuses and mastoid air cells are clear. The globes and orbits are within normal limits. Other: IMPRESSION: 1. Significant increase in size of enhancing lesion extending superiorly from the posterior left thalamus. It now measures 28 x 21 x 14 mm. The associated T2 signal changes have also progressed. Findings are consistent with progression of recurrent tumor. 2. Previously treated tumor in the left occipital lobe is stable. 3. No acute intracranial abnormality. Electronically Signed   By: Marin Roberts M.D.   On: 08/06/2022 21:09     Assessment/Plan 1. Anaplastic astrocytoma [facility])  Robert Watkins is clinically stable today, now having completed 3 cycles of (resumed) CCNU along with Tibsovo only.  MRI brain unfortunately demonstrates local progression, within left thalamic body and adjacent subcortex.    We recommended implementing treatment plan discussed prior to resumption of CCNU: salvage radiosurgery to novel and progressive enhancing disease burden.  During and following  treatment, Tibsovo will be continued.  We discussed this at legnth with him and his family today, he is agreeable with this plan.  Referral will be placed for radiation oncology eval, 3T MRI may be needed.  Case will be discussed in CNS tumor board next week as well.  We will continue dosing Ivosidenib 500mg  daily given good control and tolerance.  Chemotherapy should be held for the following:  ANC less than 1,000  Platelets less than 100,000  LFT or creatinine greater than 2x ULN  If clinical concerns/contraindications develop  Will con't Lamictal 100mg  daily.  We ask that Robert Watkins return to clinic following SRS, or sooner as needed.  We appreciate the opportunity to participate in the care of Robert Watkins.    All questions were answered. The patient knows to call the clinic with any problems, questions or concerns. No barriers to learning were detected.  I have spent a total of 40 minutes of face-to-face and non-face-to-face time, excluding clinical staff time, preparing to see patient, ordering tests and/or medications, counseling the patient, and independently interpreting results and communicating results to the patient/family/caregiver    Henreitta Leber, MD Medical Director of Neuro-Oncology [facility] at [facility]  08/08/22 11:27 AM

## 2022-08-09 ENCOUNTER — Other Ambulatory Visit (HOSPITAL_COMMUNITY): Payer: Self-pay

## 2022-08-09 ENCOUNTER — Other Ambulatory Visit: Payer: Self-pay

## 2022-08-11 ENCOUNTER — Telehealth: Payer: Self-pay

## 2022-08-11 NOTE — Telephone Encounter (Signed)
This nurse received a message from this patient stating that his job is requesting a letter stating that he should be excused from working overtime hours due to undergoing chemo and radiation therapy at this time.  This nurse forwarded request to provider for approval and other possible accommodations.  No further questions or concerns noted at this time.

## 2022-08-14 ENCOUNTER — Other Ambulatory Visit: Payer: Self-pay | Admitting: Radiation Therapy

## 2022-08-14 ENCOUNTER — Telehealth: Payer: Self-pay | Admitting: *Deleted

## 2022-08-14 ENCOUNTER — Other Ambulatory Visit (HOSPITAL_COMMUNITY): Payer: Self-pay

## 2022-08-14 ENCOUNTER — Encounter: Payer: Self-pay | Admitting: Radiation Oncology

## 2022-08-14 DIAGNOSIS — C719 Malignant neoplasm of brain, unspecified: Secondary | ICD-10-CM

## 2022-08-14 NOTE — Telephone Encounter (Signed)
Patient called stating he is working 40 plus hours per week currently and with the progression of his disease he is unable to  continue to work weekend shifts and needs  letter stating such.  Letter prepared and emailed to email on file.

## 2022-08-14 NOTE — Progress Notes (Signed)
We spoke at our CNS tumor board this morning at about Robert Watkins who has a history of radiation at [facility] for Anaplastic astrocytoma in 2012.   Consensus is that the T2 flair abnormality in his brain has been relatively stable but the progression is specific to the periventricular enhancing tumor.  He will be scheduled to see me soon to discuss radiosurgery to his active disease.  -----------------------------------  Robert Peak, MD

## 2022-08-15 ENCOUNTER — Other Ambulatory Visit: Payer: Self-pay

## 2022-08-15 NOTE — Progress Notes (Signed)
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 Further progression of nodular enhancement in the Left Thalamus since October, highly suspicious for enlarging relatively high-grade tumor. Enhancement virtually inseparable from the adjacent left choroid plexus, but no evidence of ependymal dissemination of tumor at this time. Stable T2/FLAIR hyperintensity.  No significant mass effect.   2. Stable and satisfactory post treatment appearance of the left occipital lobe.  MR BRAIN W WO CONTRAST 06-01-22 IMPRESSION: Minimal progression of enhancement at the inferior and lateral margins of the enhancing left thalamic lesion. No change in the infiltrating T2 signal around the resection site.  MR BRAIN W WO CONTRAST 08-03-22 IMPRESSION: 1. Significant increase in size of enhancing lesion extending superiorly from the posterior left thalamus. It now measures 28 x 21 x 14 mm. The associated T2 signal changes have also progressed. Findings are consistent with progression of recurrent tumor. 2. Previously treated tumor in the left occipital lobe is stable. 3. No acute intracranial abnormality.  Patient presented with symptoms of: (Per Dr. Liana Gerold note on 08-08-22) MRI brain unfortunately demonstrates local progression, within left thalamic body and adjacent subcortex.    Past or anticipated interventions, if any, per neurosurgery: (Per referral packet)   06-09-10 radiation notes-Original new pt evaluation from 2012    Final treatment note:   Past or anticipated interventions, if any, per medical oncology:  Dr. Barbaraann Cao on 08-08-22 Oncologic History: 05/12/2010 Biopsy  Stereotactic biopsy of left occipital lesion by Dr. Barnett Abu in [place]. Pathology demonstrates anaplastic astrocytoma (WHO III)  05/12/2010 Initial Diagnosis  Left occipital anaplastic astrocytoma (WHO  Grade III)  05/26/2010 Surgery  Left parietooccipital craniotomy with tumor resection by Dr. Lavonna Monarch. Pathology confirms anaplastic astrocytoma.   06/01/2010 Clinical Event-Other  New patient evaluation at [facility]. Recommend radiation therapy with concurrent Temozolomide followed by twelve cycles of five-day Temozolomide.   06/13/2010 - 08/01/2010 Radiation  Radiation with Temodar.  06/13/2010 - 08/01/2010 Chemotherapy  Temodar 75 mg/m2 with radiation.  08/16/2010 - 08/07/2011 Chemotherapy  5 day Temodar 200 mg/m2  02/19/2018 Progression  Progression of non-enhancing tumor compared to 2013. Recommend initiating metronomic daily Temodar 50 mg/m2/day.   02/12/20 Progression Initiates Tibsovo  daily   10/21/20 Progression Adds oral CCNU /m2 q6 weeks to daily Tibsovo  Assessment/Plan 1. Anaplastic astrocytoma [facility])   Robert Watkins is clinically stable today, now having completed 3 cycles of (resumed) CCNU along with Tibsovo only.  MRI brain unfortunately demonstrates local progression, within left thalamic body and adjacent subcortex.     We recommended implementing treatment plan discussed prior to resumption of CCNU: salvage radiosurgery to novel and progressive enhancing disease burden.  During and following treatment, Tibsovo will be continued.  We discussed this at legnth with him and his family today, he is agreeable with this plan.   Referral will be placed for radiation oncology eval, 3T MRI may be needed.  Case will be discussed in CNS tumor board next week as well.   We will continue dosing Ivosidenib  daily given good control and tolerance.  Chemotherapy should be held for the following:  ANC less than 1,000  Platelets less than 100,000  LFT or creatinine greater than 2x ULN  If clinical concerns/contraindications develop   Will con't Lamictal  daily.   We ask that Robert Watkins return to clinic following  SRS, or sooner as needed.  Dose of Decadron, if applicable: no  Recent neurologic symptoms, if any:  Seizures: no, some auras in past but no activity Headaches: no Nausea: no Dizziness/ataxia: no Difficulty with hand coordination: no Focal numbness/weakness: no Visual deficits/changes: no, does not feel like it has gotten worse, vision cut in right side as part of surgery, noticing it more now Confusion/Memory deficits: no  Painful bone metastases at present, if any: no  SAFETY ISSUES: Prior radiation? Yes, 2012 to Brain Pacemaker/ICD? no Possible current pregnancy? na Is the patient on methotrexate? no  Additional Complaints / other details: Pt had no major concerns or questions at this time. He familiar with this process with prior radiation.

## 2022-08-17 ENCOUNTER — Ambulatory Visit (HOSPITAL_COMMUNITY)
Admission: RE | Admit: 2022-08-17 | Discharge: 2022-08-17 | Disposition: A | Discharge status: Home / Self Care | Payer: Managed Care, Other (non HMO) | Source: Ambulatory Visit | Attending: Radiation Oncology | Admitting: Radiation Oncology

## 2022-08-17 ENCOUNTER — Other Ambulatory Visit (HOSPITAL_COMMUNITY): Payer: Self-pay

## 2022-08-17 DIAGNOSIS — C719 Malignant neoplasm of brain, unspecified: Secondary | ICD-10-CM | POA: Insufficient documentation

## 2022-08-17 MED ORDER — GADOBUTROL 1 MMOL/ML IV SOLN
7.0000 mL | Freq: Once | INTRAVENOUS | Status: AC | PRN
  Administered 2022-08-17: 7 mL via INTRAVENOUS

## 2022-08-18 ENCOUNTER — Telehealth: Payer: Self-pay

## 2022-08-18 ENCOUNTER — Other Ambulatory Visit (HOSPITAL_COMMUNITY): Payer: Self-pay

## 2022-08-18 ENCOUNTER — Encounter: Payer: Self-pay | Admitting: Radiation Oncology

## 2022-08-18 NOTE — Telephone Encounter (Signed)
Pt called for nurse eval and consult information. Pt was doing well overall with no needs or concerns. Note routed to Dr. Basilio Cairo.

## 2022-08-18 NOTE — Telephone Encounter (Signed)
Rn attempted to call pt to obtain nurse evaluation information without success. Voicemail was left for pt. Rn will attempt to call back this afternoon.

## 2022-08-19 NOTE — Progress Notes (Signed)
Radiation Oncology         [phone] ________________________________  Initial Outpatient Consultation  Name: Robert Watkins MRN: [id number]  Date: 08/21/2022  DOB: [birth year]  CC:Pcp, No  Vaslow, Georgeanna Lea, MD   REFERRING PHYSICIAN: Henreitta Leber, MD  DIAGNOSIS: C71.4   ICD-10-CM   1. 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 Now with local progression   CHIEF COMPLAINT: Here to discuss management of brain cancer  HISTORY OF PRESENT ILLNESS::Robert Watkins is a 37 y.o. male with a past medical history significant for anaplastic astrocytoma diagnosed in early 2012; s/p left parietoccipital craniotomy for resection of the mass on 05/26/2010. A postoperative MRI of the brain was performed which showed a left occipital mass with increased FLAIR signal and edema. He subsequently underwent post-operative radiation therapy under the care of Dr. Michell Heinrich along with concurrent chemotherapy with temodar from 06/13/10 through 08/01/10. This was followed by maintenance temodar which he completed in April of 2013.   In 2019, the patient was found to have evidence of disease progression characterized by an increase size of the left occipital mass. He was subsequently started on daily temodar. His disease however progressed further in October of 2021 and he was started on Tibsovo. Unfortunately, his disease again progressed in June of 2022 and oral CCNU (q 6 weeks) and Ivosidernib were added to his regimen. Through out his disease history, the patient presented for routine surveillance imaging and followed with Dr. Barbaraann Cao. His disease remained relatively stable at that time while undergoing treatment with CCNU + Tibsovo. Dr. Barbaraann Cao discontinued CCNU on 11/17/21 following 8 cycles of treatment in light of new thrombocytopenia and the  prolonged (year long) duration of his treatment. He did however continue on Ivosidenib.   In more recent history, the patient presented for a follow up brain MRI on 02/17/22 which showed evidence of disease progression, demonstrated by: new flair signal abnormality and enhancement in the left thalamus, along with nonenhancing flair signal abnormality extending more posteriorly along the ependymal lining of the left lateral ventricle, suspicious for recurrent tumor.  Subsequently, Dr. Barbaraann Cao resumed him on CCNU with tibsovo on 03/02/22. MRI of the brain on 04/12/22 showed further progression of the nodular enhancement in the left thalamus since his prior MRI in October. The enhancement was noted to appear as almost inseparable from the adjacent left choroid plexus, but without evidence of ependymal dissemination of tumor. MRI also showed stable post treatment changes in the left occipital lobe.   Dr. Barbaraann Cao reviewed his MRI and noted that although an increase in size/cross sectional diameter was seen in the left thalamus enhancement, the enhancement otherwise demonstrated stable FLAIR signaling, and appeared stable coronally and sagittally. Dr. Barbaraann Cao ultimately recommended proceeding with an additional cycle of CCNU and repeat imaging.   Follow-up MRI of the brain on 06/01/22 showed minimal interval progression of the enhancement at the inferior and lateral margins of the enhancing left thalamic lesion. No change was otherwise seen in the infiltrating T2 signal around the resection site. In light of MRI findings, Dr. Barbaraann Cao again recommended proceeding with an additional cycle of CCNU followed by repeat imaging.   Follow-up MRI of the brain on 08/03/22 unfortunately demonstrated a significant increase in size of the enhancing lesion extending superiorly from the posterior left thalamus, measuring 28 mm  in the greatest extent. Interval progression of the associated T2 signal changes were also appreciated.  The previously treated tumor in the left occipital lobe otherwise appeared stable, and no other intracranial abnormalities were appreciated.   Due to evidence of local disease progression, the patient was seen [facility] with recommendations for  salvage radiosurgery to the progressive disease.  The assessment was that the patient's area of progression was predominantly corresponding to the enhancing tumor in the periventricular region on his MRI while the FLAIR abnormality was relatively stable.  This assessment was shared by our tumor board when his MRI from April 11 was reviewed earlier this month.  The patient was referred to me for consultation and I asked our navigator to order another MRI so that we had an up-to-date image of his tumor to account for any disease progression in the 2 week interval between his previous MRI and treatment planning  Brain MRI on 08/17/2022 was reviewed at our CNS tumor board this morning.  It demonstrates progressive increase in contrast enhancement and T2/FLAIR hyperintense signal abnormality in the region of the left thalamus with extension of tumor across midline and increased contrast enhancement along the ependymal margin of the left lateral ventricular system.  This is a substantial change from his MRI 2 weeks ago  Based on tumor board consensus, stereotactic radiosurgery is no longer a good option for him.  The recommendation is that he undergo IMRT at conventional fractionation to account for the progressive FLAIR abnormality and enhancing tumor, which will be a larger field than originally anticipated.  Dr. Barbaraann Cao is currently reaching out to the team at [facility] to optimize his systemic therapy with possibility of concurrent systemic therapy (such as Avastin and possibly temozolomide) while he undergoes radiation treatment.  It is unclear whether he will continue Tibsovo  Dose of Decadron, if applicable: no  Recent neurologic symptoms, if any:   Seizures: no, some auras in past but no activity Headaches: no Nausea: no Dizziness/ataxia: no Difficulty with hand coordination: no Focal numbness/weakness: no Visual deficits/changes: no, does not feel like it has gotten worse, vision cut in right side as part of surgery, noticing it more now Confusion/Memory deficits: no  Painful bone metastases at present, if any: no  SAFETY ISSUES: Prior radiation? Yes, 2012 to Brain Pacemaker/ICD? no Possible current pregnancy? na Is the patient on methotrexate? no  Additional Complaints / other details: Pt had no major concerns or questions at this time. He familiar with this process with prior radiation.   PREVIOUS RADIATION THERAPY: Yes - Dr. 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    PAST SURGICAL HISTORY: Past Surgical History:  Procedure Laterality Date   CRANIECTOMY / CRANIOTOMY FOR EXCISION OF BRAIN TUMOR Left 05/26/2010   Left parietooccipital craniotomy with tumor resection by Dr. Lavonna Monarch. Pathology confirms anaplastic astrocytoma. ?   CRANIOTOMY FOR STEREOTACTIC GUIDED SURGERY Left 05/12/2010   Stereotactic biopsy of left occipital lesion by Dr. Barnett Abu in [place]. Pathology demonstrates anaplastic astrocytoma (WHO III)?    FAMILY HISTORY: family history is not on file.  SOCIAL HISTORY:  reports that he has quit smoking. He has never used smokeless tobacco. He reports current alcohol use of about 4.0 standard drinks of alcohol per week. He reports current drug use. Drug:  Marijuana.  ALLERGIES: Patient has no active allergies.  MEDICATIONS:  Current Outpatient Medications  Medication Sig Dispense Refill    ivosidenib (TIBSOVO) 250 MG tablet Take 2 tablets (500 mg total) by mouth daily. 60 tablet 0   lamoTRIgine (LAMICTAL) 100 MG tablet Take 1 tablet (100 mg total) by mouth daily. 60 tablet 1   ondansetron (ZOFRAN) 8 MG tablet Take 1 tablet by mouth 2 times daily as needed. Start on the third day after chemotherapy. 30 tablet 1   amoxicillin (AMOXIL) 500 MG tablet Take 1 tablet (500 mg total) by mouth 2 (two) times daily. (Patient not taking: Reported on 08/08/2022) 20 tablet 0   diazepam (VALIUM) 5 MG tablet Take 1 tablet (5 mg total) by mouth every 6 (six) hours as needed for anxiety. (Patient not taking: Reported on 08/08/2022) 10 tablet 0   No current facility-administered medications for this encounter.    REVIEW OF SYSTEMS:  Notable for that above.   PHYSICAL EXAM:  vitals were not taken for this visit.   General: Alert and oriented, in no acute distress  HEENT: Head is normocephalic. Extraocular movements are intact. Oropharynx is clear.  There is some alopecia over the posterior scalp.  Slight peripheral visual cut in right fields Heart: Regular in rate and rhythm with no murmurs, rubs, or gallops. Chest: Clear to auscultation bilaterally, with no rhonchi, wheezes, or rales. Extremities: No cyanosis or edema. Lymphatics: see Neck Exam Skin: No concerning lesions. Musculoskeletal: symmetric strength and muscle tone throughout. Neurologic: Cranial nerves II through XII are grossly intact. No obvious focalities. Speech is fluent. Coordination is intact. Slight peripheral visual cut in right fields Psychiatric: Judgment and insight are intact. Affect is appropriate.  KPS = 90  100 - Normal; no complaints; no evidence of disease. 90   - Able to carry on normal activity; minor signs or symptoms of disease. 80   - Normal activity with effort; some signs or symptoms of disease. 37   - Cares for self; unable to carry on normal activity or to do active work. 60   - Requires occasional  assistance, but is able to care for most of his personal needs. 50   - Requires considerable assistance and frequent medical care. 40   - Disabled; requires special care and assistance. 30   - Severely disabled; hospital admission is indicated although death not imminent. 20   - Very sick; hospital admission necessary; active supportive treatment necessary. 10   - Moribund; fatal processes progressing rapidly. 0     - Dead  Karnofsky DA, Abelmann WH, Craver LS and Burchenal JH [phone]) The use of the nitrogen mustards in the palliative treatment of carcinoma: with particular reference to bronchogenic carcinoma Cancer 1 [id number]    LABORATORY DATA:  Lab Results  Component Value Date   WBC 3.0 (L) 08/08/2022   HGB 13.2 08/08/2022   HCT RESULTS UNAVAILABLE DUE TO INTERFERING SUBSTANCE 08/08/2022   MCV RESULTS UNAVAILABLE DUE TO INTERFERING SUBSTANCE 08/08/2022   PLT 117 (L) 08/08/2022   CMP     Component Value Date/Time   NA 141 08/08/2022 1053   K 4.2 08/08/2022 1053   CL 108 08/08/2022 1053   CO2 29 08/08/2022 1053   GLUCOSE 98 08/08/2022 1053   BUN 20 08/08/2022 1053   CREATININE 1.09 08/08/2022 1053   CALCIUM 9.4 08/08/2022 1053   PROT 6.4 (L) 08/08/2022 1053   ALBUMIN 4.3 08/08/2022 1053   AST 55 (H) 08/08/2022 1053   ALT  47 (H) 08/08/2022 1053   ALKPHOS 53 08/08/2022 1053   BILITOT 0.9 08/08/2022 1053   GFRNONAA >60 08/08/2022 1053   GFRAA >60 08/19/2018 0854         RADIOGRAPHY: MR BRAIN W WO CONTRAST  Result Date: 08/06/2022 CLINICAL DATA:  Anaplastic astrocytoma progressive signal abnormality in the left thalamus concerning for recent tumor recurrence. Restaging. EXAM: MRI HEAD WITHOUT AND WITH CONTRAST TECHNIQUE: Multiplanar, multiecho pulse sequences of the brain and surrounding structures were obtained without and with intravenous contrast. CONTRAST:  8 mL Vueway COMPARISON:  Multiple prior MRI of the head without and with contrast including 06/01/2022, 04/12/2022  and 02/17/2022 FINDINGS: Brain: The enhancing lesion in extending superiorly from the posterior left thalamus demonstrates significant increase in size since the prior exam. It now measures 28 x 21 x 14 mm (AP x CC x TR). The associated T2 signal changes have also progressed. Early extension of the posterior limb of the internal capsule is noted. Previously treated tumor in the left occipital lobe is stable. No distal lesions are present. No acute infarct is present. Ventricles are of normal size. No significant extra-axial fluid collection is present. The brainstem and cerebellum are within normal limits. The internal auditory canals are within normal limits. Midline structures are within normal limits. Vascular: Flow is present in the major intracranial arteries. Skull and upper cervical spine: The craniocervical junction is normal. Upper cervical spine is within normal limits. Marrow signal is unremarkable. Sinuses/Orbits: The paranasal sinuses and mastoid air cells are clear. The globes and orbits are within normal limits. Other: IMPRESSION: 1. Significant increase in size of enhancing lesion extending superiorly from the posterior left thalamus. It now measures 28 x 21 x 14 mm. The associated T2 signal changes have also progressed. Findings are consistent with progression of recurrent tumor. 2. Previously treated tumor in the left occipital lobe is stable. 3. No acute intracranial abnormality. Electronically Signed   By: Marin Roberts M.D.   On: 08/06/2022 21:09      IMPRESSION/PLAN: This is a wonderful 37 year old gentleman with locally progressive anaplastic astrocytoma, status post chemoradiation approximately 12 years ago.  There has been rapid progression of his disease in a 2-week period.  Today, I talked to Mr. Wessell and his supportive parents about the findings and work-up thus far.  We discussed his diagnosis of progressive anaplastic astrocytoma and general treatment for this, highlighting  the role of palliative salvage radiotherapy in the management.  We discussed the available radiation techniques, and focused on the details of logistics and delivery.     He has an excellent performance status and wants to be aggressive in his care.  We talked about my recommendations for an aggressive treatment regimen to give him the best chance at local control while balancing that goal with preservation of his quality of life.  I recommend that he undergo a treatment regimen over approximately 6 weeks in which he would receive conventional fractionated IMRT at 1.8Gy/ day to the progressive disease, 5 days a week.  He understands that Dr. Barbaraann Cao will be in touch with him about systemic therapy options.  There is a possibility that he will receive Avastin concurrently and maybe temozolomide.  Dr. Barbaraann Cao is in touch with the [facility] oncology team to optimize treatment recommendations.  I have also personally reached out to a colleague who is a nationally recognized expert in CNS radiation oncology who concurs with my recommendations given Mr. Cuyler's challenging case.  We discussed the risks benefits  and side effects of reirradiation to the brain.  Side effects may include but not necessarily be limited to hair loss , skin irritation, fatigue, headache, nausea, vomiting, injury to the brain, radionecrosis of the brain, cognitive decline, progressive visual disturbance.  No guarantees of treatment were given.  Consent form was signed and placed in his medical record.  He and his family were encouraged to ask questions that I answered to the best my ability.  Simulation will take place today and we will start his treatment in 1 week, coordinating care with medical/neuro-oncology.  His mother was understandably tearful today and I gave the whole family emotional support, letting them know that we will be there for them in this challenging time.  They have a very resilient attitude and were very receptive to the  emotional support.   On date of service, in total, I spent 65 minutes on this encounter. Patient was seen in person.  Note was signed after date of service.  Minutes pertaining to date of service only  __________________________________________   Lonie Peak, MD  This document serves as a record of services personally performed by Lonie Peak, MD. It was created on her behalf by Neena Rhymes, a trained medical scribe. The creation of this record is based on the scribe's personal observations and the provider's statements to them. This document has been checked and approved by the attending provider.

## 2022-08-21 ENCOUNTER — Ambulatory Visit
Admission: RE | Admit: 2022-08-21 | Discharge: 2022-08-21 | Disposition: A | Discharge status: Home / Self Care | Payer: Managed Care, Other (non HMO) | Source: Ambulatory Visit | Attending: Radiation Oncology | Admitting: Radiation Oncology

## 2022-08-21 ENCOUNTER — Ambulatory Visit: Payer: Managed Care, Other (non HMO) | Admitting: Radiation Oncology

## 2022-08-21 ENCOUNTER — Encounter: Payer: Self-pay | Admitting: Internal Medicine

## 2022-08-21 ENCOUNTER — Other Ambulatory Visit: Payer: Self-pay | Admitting: Internal Medicine

## 2022-08-21 VITALS — BP 108/60 | HR 50 | Temp 97.9°F | Resp 18 | Ht 71.0 in | Wt 172.4 lb

## 2022-08-21 DIAGNOSIS — Z79899 Other long term (current) drug therapy: Secondary | ICD-10-CM | POA: Insufficient documentation

## 2022-08-21 DIAGNOSIS — Z510 Encounter for antineoplastic radiation therapy: Secondary | ICD-10-CM | POA: Diagnosis not present

## 2022-08-21 DIAGNOSIS — Z9221 Personal history of antineoplastic chemotherapy: Secondary | ICD-10-CM | POA: Insufficient documentation

## 2022-08-21 DIAGNOSIS — G40909 Epilepsy, unspecified, not intractable, without status epilepticus: Secondary | ICD-10-CM | POA: Insufficient documentation

## 2022-08-21 DIAGNOSIS — Z923 Personal history of irradiation: Secondary | ICD-10-CM | POA: Insufficient documentation

## 2022-08-21 DIAGNOSIS — C714 Malignant neoplasm of occipital lobe: Secondary | ICD-10-CM

## 2022-08-21 DIAGNOSIS — C719 Malignant neoplasm of brain, unspecified: Secondary | ICD-10-CM

## 2022-08-22 ENCOUNTER — Other Ambulatory Visit: Payer: Self-pay

## 2022-08-22 ENCOUNTER — Other Ambulatory Visit (HOSPITAL_COMMUNITY): Payer: Self-pay

## 2022-08-22 ENCOUNTER — Encounter: Payer: Self-pay | Admitting: Radiation Oncology

## 2022-08-23 ENCOUNTER — Other Ambulatory Visit: Payer: Self-pay | Admitting: *Deleted

## 2022-08-23 DIAGNOSIS — G40909 Epilepsy, unspecified, not intractable, without status epilepticus: Secondary | ICD-10-CM

## 2022-08-23 MED ORDER — LAMOTRIGINE 100 MG PO TABS
100.0000 mg | ORAL_TABLET | Freq: Every day | ORAL | 1 refills | Status: DC
Start: 2022-08-23 — End: 2022-12-18

## 2022-08-24 ENCOUNTER — Telehealth: Payer: Self-pay | Admitting: Internal Medicine

## 2022-08-24 NOTE — Telephone Encounter (Signed)
Scheduled per 04/29 los, patient has been called and voicemail was left.

## 2022-08-25 ENCOUNTER — Ambulatory Visit: Payer: Managed Care, Other (non HMO) | Admitting: Radiation Oncology

## 2022-08-25 DIAGNOSIS — C714 Malignant neoplasm of occipital lobe: Secondary | ICD-10-CM | POA: Insufficient documentation

## 2022-08-25 DIAGNOSIS — Z79899 Other long term (current) drug therapy: Secondary | ICD-10-CM | POA: Diagnosis not present

## 2022-08-25 DIAGNOSIS — Z923 Personal history of irradiation: Secondary | ICD-10-CM | POA: Diagnosis not present

## 2022-08-25 DIAGNOSIS — R5383 Other fatigue: Secondary | ICD-10-CM | POA: Diagnosis not present

## 2022-08-25 DIAGNOSIS — Z510 Encounter for antineoplastic radiation therapy: Secondary | ICD-10-CM | POA: Insufficient documentation

## 2022-08-25 DIAGNOSIS — Z5112 Encounter for antineoplastic immunotherapy: Secondary | ICD-10-CM | POA: Diagnosis present

## 2022-08-25 DIAGNOSIS — Z87891 Personal history of nicotine dependence: Secondary | ICD-10-CM | POA: Diagnosis not present

## 2022-08-28 ENCOUNTER — Ambulatory Visit
Admission: RE | Admit: 2022-08-28 | Discharge: 2022-08-28 | Disposition: A | Discharge status: Home / Self Care | Payer: Managed Care, Other (non HMO) | Source: Ambulatory Visit | Attending: Radiation Oncology | Admitting: Radiation Oncology

## 2022-08-28 ENCOUNTER — Other Ambulatory Visit: Payer: Self-pay

## 2022-08-28 ENCOUNTER — Ambulatory Visit: Payer: Managed Care, Other (non HMO) | Admitting: Radiation Oncology

## 2022-08-28 DIAGNOSIS — C714 Malignant neoplasm of occipital lobe: Secondary | ICD-10-CM

## 2022-08-28 DIAGNOSIS — Z5112 Encounter for antineoplastic immunotherapy: Secondary | ICD-10-CM | POA: Diagnosis not present

## 2022-08-28 LAB — RAD ONC ARIA SESSION SUMMARY
Course Elapsed Days: 0
Plan Fractions Treated to Date: 1
Plan Prescribed Dose Per Fraction: 1.8 Gy
Plan Total Fractions Prescribed: 29
Plan Total Prescribed Dose: 52.2 Gy
Reference Point Dosage Given to Date: 1.8 Gy
Reference Point Session Dosage Given: 1.8 Gy
Session Number: 1

## 2022-08-28 MED ORDER — SONAFINE EX EMUL
1.0000 | Freq: Two times a day (BID) | CUTANEOUS | Status: DC
  Administered 2022-08-28: 1 via TOPICAL

## 2022-08-29 ENCOUNTER — Other Ambulatory Visit: Payer: Managed Care, Other (non HMO)

## 2022-08-29 ENCOUNTER — Inpatient Hospital Stay: Payer: Managed Care, Other (non HMO) | Attending: Internal Medicine

## 2022-08-29 ENCOUNTER — Other Ambulatory Visit: Payer: Self-pay

## 2022-08-29 ENCOUNTER — Inpatient Hospital Stay (HOSPITAL_BASED_OUTPATIENT_CLINIC_OR_DEPARTMENT_OTHER): Payer: Managed Care, Other (non HMO) | Admitting: Internal Medicine

## 2022-08-29 ENCOUNTER — Inpatient Hospital Stay: Payer: Managed Care, Other (non HMO)

## 2022-08-29 ENCOUNTER — Ambulatory Visit
Admission: RE | Admit: 2022-08-29 | Discharge: 2022-08-29 | Disposition: A | Discharge status: Home / Self Care | Payer: Managed Care, Other (non HMO) | Source: Ambulatory Visit | Attending: Radiation Oncology | Admitting: Radiation Oncology

## 2022-08-29 VITALS — BP 118/68 | HR 50 | Resp 16

## 2022-08-29 DIAGNOSIS — Z87891 Personal history of nicotine dependence: Secondary | ICD-10-CM | POA: Insufficient documentation

## 2022-08-29 DIAGNOSIS — Z79899 Other long term (current) drug therapy: Secondary | ICD-10-CM | POA: Insufficient documentation

## 2022-08-29 DIAGNOSIS — Z5112 Encounter for antineoplastic immunotherapy: Secondary | ICD-10-CM | POA: Diagnosis not present

## 2022-08-29 DIAGNOSIS — C719 Malignant neoplasm of brain, unspecified: Secondary | ICD-10-CM | POA: Diagnosis not present

## 2022-08-29 DIAGNOSIS — Z510 Encounter for antineoplastic radiation therapy: Secondary | ICD-10-CM | POA: Insufficient documentation

## 2022-08-29 DIAGNOSIS — R5383 Other fatigue: Secondary | ICD-10-CM | POA: Insufficient documentation

## 2022-08-29 DIAGNOSIS — Z923 Personal history of irradiation: Secondary | ICD-10-CM | POA: Insufficient documentation

## 2022-08-29 DIAGNOSIS — C714 Malignant neoplasm of occipital lobe: Secondary | ICD-10-CM | POA: Insufficient documentation

## 2022-08-29 LAB — CBC WITH DIFFERENTIAL (CANCER CENTER ONLY)
Abs Immature Granulocytes: 0.01 10*3/uL (ref 0.00–0.07)
Basophils Absolute: 0 10*3/uL (ref 0.0–0.1)
Basophils Relative: 0 %
Eosinophils Absolute: 0.1 10*3/uL (ref 0.0–0.5)
Eosinophils Relative: 2 %
HCT: 36.7 % — ABNORMAL LOW (ref 39.0–52.0)
Hemoglobin: 13.7 g/dL (ref 13.0–17.0)
Immature Granulocytes: 0 %
Lymphocytes Relative: 13 %
Lymphs Abs: 0.4 10*3/uL — ABNORMAL LOW (ref 0.7–4.0)
MCH: 37.3 pg — ABNORMAL HIGH (ref 26.0–34.0)
MCHC: 37.3 g/dL — ABNORMAL HIGH (ref 30.0–36.0)
MCV: 100 fL (ref 80.0–100.0)
Monocytes Absolute: 0.2 10*3/uL (ref 0.1–1.0)
Monocytes Relative: 6 %
Neutro Abs: 2.4 10*3/uL (ref 1.7–7.7)
Neutrophils Relative %: 79 %
Platelet Count: 98 10*3/uL — ABNORMAL LOW (ref 150–400)
RBC: 3.67 MIL/uL — ABNORMAL LOW (ref 4.22–5.81)
RDW: 11.5 % (ref 11.5–15.5)
WBC Count: 3.1 10*3/uL — ABNORMAL LOW (ref 4.0–10.5)
nRBC: 0 % (ref 0.0–0.2)

## 2022-08-29 LAB — RAD ONC ARIA SESSION SUMMARY
Course Elapsed Days: 1
Plan Fractions Treated to Date: 2
Plan Prescribed Dose Per Fraction: 1.8 Gy
Plan Total Fractions Prescribed: 29
Plan Total Prescribed Dose: 52.2 Gy
Reference Point Dosage Given to Date: 3.6 Gy
Reference Point Session Dosage Given: 1.8 Gy
Session Number: 2

## 2022-08-29 LAB — TOTAL PROTEIN, URINE DIPSTICK: Protein, ur: NEGATIVE mg/dL

## 2022-08-29 MED ORDER — SODIUM CHLORIDE 0.9 % IV SOLN: Freq: Once | INTRAVENOUS | Status: AC

## 2022-08-29 MED ORDER — SODIUM CHLORIDE 0.9 % IV SOLN
10.0000 mg/kg | Freq: Once | INTRAVENOUS | Status: AC
  Administered 2022-08-29: 800 mg via INTRAVENOUS
  Filled 2022-08-29: qty 32

## 2022-08-29 NOTE — Progress Notes (Signed)
Per Elissa Hefty, MD okay to treat with platelets 98.

## 2022-08-29 NOTE — Patient Instructions (Signed)
[facility]  Discharge Instructions: Thank you for choosing [facility] to provide your oncology and hematology care.   If you have a lab appointment with the Cancer Center, please go directly to the Cancer Center and check in at the registration area.   Wear comfortable clothing and clothing appropriate for easy access to any Portacath or PICC line.   We strive to give you quality time with your provider. You may need to reschedule your appointment if you arrive late (15 or more minutes).  Arriving late affects you and other patients whose appointments are after yours.  Also, if you miss three or more appointments without notifying the office, you may be dismissed from the clinic at the provider's discretion.      For prescription refill requests, have your pharmacy contact our office and allow 72 hours for refills to be completed.    Today you received the following chemotherapy and/or immunotherapy agents Bevacizumab (MVASI)      To help prevent nausea and vomiting after your treatment, we encourage you to take your nausea medication as directed.  BELOW ARE SYMPTOMS THAT SHOULD BE REPORTED IMMEDIATELY: *FEVER GREATER THAN 100.4 F (38 C) OR HIGHER *CHILLS OR SWEATING *NAUSEA AND VOMITING THAT IS NOT CONTROLLED WITH YOUR NAUSEA MEDICATION *UNUSUAL SHORTNESS OF BREATH *UNUSUAL BRUISING OR BLEEDING *URINARY PROBLEMS (pain or burning when urinating, or frequent urination) *BOWEL PROBLEMS (unusual diarrhea, constipation, pain near the anus) TENDERNESS IN MOUTH AND THROAT WITH OR WITHOUT PRESENCE OF ULCERS (sore throat, sores in mouth, or a toothache) UNUSUAL RASH, SWELLING OR PAIN  UNUSUAL VAGINAL DISCHARGE OR ITCHING   Items with * indicate a potential emergency and should be followed up as soon as possible or go to the Emergency Department if any problems should occur.  Please show the CHEMOTHERAPY ALERT CARD or IMMUNOTHERAPY ALERT CARD  at check-in to the Emergency Department and triage nurse.  Should you have questions after your visit or need to cancel or reschedule your appointment, please contact [facility]  Dept: [phone]  and follow the prompts.  Office hours are 8:00 a.m. to 4:30 p.m. Monday - Friday. Please note that voicemails left after 4:00 p.m. may not be returned until the following business day.  We are closed weekends and major holidays. You have access to a nurse at all times for urgent questions. Please call the main number to the clinic Dept: [phone] and follow the prompts.   For any non-urgent questions, you may also contact your provider using MyChart. We now offer e-Visits for anyone 15 and older to request care online for non-urgent symptoms. For details visit [email or web address].   Also download the MyChart app! Go to the app store, search "MyChart", open the app, select [place], and log in with your MyChart username and password.  Bevacizumab Injection What is this medication? BEVACIZUMAB (be va SIZ yoo mab) treats some types of cancer. It works by blocking a protein that causes cancer cells to grow and multiply. This helps to slow or stop the spread of cancer cells. It is a monoclonal antibody. This medicine may be used for other purposes; ask your health care provider or pharmacist if you have questions. COMMON BRAND NAME(S): Alymsys, Avastin, MVASI, Omer Jack What should I tell my care team before I take this medication? They need to know if you have any of these conditions: Blood clots Coughing up blood Having or recent surgery  Heart failure High blood pressure History of a connection between 2 or more body parts that do not usually connect (fistula) History of a tear in your stomach or intestines Protein in your urine An unusual or allergic reaction to bevacizumab, other medications, foods, dyes, or preservatives Pregnant or trying to get  pregnant Breast-feeding How should I use this medication? This medication is injected into a vein. It is given by your care team in a hospital or clinic setting. Talk to your care team the use of this medication in children. Special care may be needed. Overdosage: If you think you have taken too much of this medicine contact a poison control center or emergency room at once. NOTE: This medicine is only for you. Do not share this medicine with others. What if I miss a dose? Keep appointments for follow-up doses. It is important not to miss your dose. Call your care team if you are unable to keep an appointment. What may interact with this medication? Interactions are not expected. This list may not describe all possible interactions. Give your health care provider a list of all the medicines, herbs, non-prescription drugs, or dietary supplements you use. Also tell them if you smoke, drink alcohol, or use illegal drugs. Some items may interact with your medicine. What should I watch for while using this medication? Your condition will be monitored carefully while you are receiving this medication. You may need blood work while taking this medication. This medication may make you feel generally unwell. This is not uncommon as chemotherapy can affect healthy cells as well as cancer cells. Report any side effects. Continue your course of treatment even though you feel ill unless your care team tells you to stop. This medication may increase your risk to bruise or bleed. Call your care team if you notice any unusual bleeding. Before having surgery, talk to your care team to make sure it is ok. This medication can increase the risk of poor healing of your surgical site or wound. You will need to stop this medication for 28 days before surgery. After surgery, wait at least 28 days before restarting this medication. Make sure the surgical site or wound is healed enough before restarting this medication. Talk  to your care team if questions. Talk to your care team if you may be pregnant. Serious birth defects can occur if you take this medication during pregnancy and for 6 months after the last dose. Contraception is recommended while taking this medication and for 6 months after the last dose. Your care team can help you find the option that works for you. Do not breastfeed while taking this medication and for 6 months after the last dose. This medication can cause infertility. Talk to your care team if you are concerned about your fertility. What side effects may I notice from receiving this medication? Side effects that you should report to your care team as soon as possible: Allergic reactions--skin rash, itching, hives, swelling of the face, lips, tongue, or throat Bleeding--bloody or black, tar-like stools, vomiting blood or brown material that looks like coffee grounds, red or dark brown urine, small red or purple spots on skin, unusual bruising or bleeding Blood clot--pain, swelling, or warmth in the leg, shortness of breath, chest pain Heart attack--pain or tightness in the chest, shoulders, arms, or jaw, nausea, shortness of breath, cold or clammy skin, feeling faint or lightheaded Heart failure--shortness of breath, swelling of the ankles, feet, or hands, sudden weight gain, unusual weakness  or fatigue Increase in blood pressure Infection--fever, chills, cough, sore throat, wounds that don't heal, pain or trouble when passing urine, general feeling of discomfort or being unwell Infusion reactions--chest pain, shortness of breath or trouble breathing, feeling faint or lightheaded Kidney injury--decrease in the amount of urine, swelling of the ankles, hands, or feet Stomach pain that is severe, does not go away, or gets worse Stroke--sudden numbness or weakness of the face, arm, or leg, trouble speaking, confusion, trouble walking, loss of balance or coordination, dizziness, severe headache,  change in vision Sudden and severe headache, confusion, change in vision, seizures, which may be signs of posterior reversible encephalopathy syndrome (PRES) Side effects that usually do not require medical attention (report to your care team if they continue or are bothersome): Back pain Change in taste Diarrhea Dry skin Increased tears Nosebleed This list may not describe all possible side effects. Call your doctor for medical advice about side effects. You may report side effects to FDA at 1-800-FDA-1088. Where should I keep my medication? This medication is given in a hospital or clinic. It will not be stored at home. NOTE: This sheet is a summary. It may not cover all possible information. If you have questions about this medicine, talk to your doctor, pharmacist, or health care provider.  2023 Elsevier/Gold Standard (2021-08-12 00:00:00)

## 2022-08-29 NOTE — Progress Notes (Signed)
[facility] [address]  [place], [place] [address] [phone]   Interval Evaluation  Date of Service: 08/29/22 Patient Name: Robert Watkins Patient MRN: [id number] Patient DOB: 12-30-1985 Provider: Henreitta Leber, MD  Identifying Statement:  Robert Watkins is a 37 y.o. 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 Barnett Abu in [place]. 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 Lavonna Monarch. Pathology confirms anaplastic astrocytoma.   06/01/2010 Clinical Event-Other  New patient evaluation at [facility]. Recommend radiation therapy with concurrent Temozolomide followed by twelve cycles of five-day Temozolomide.   06/13/2010 - 08/01/2010 Radiation  Radiation with Temodar.  06/13/2010 - 08/01/2010 Chemotherapy  Temodar 75 mg/m2 with radiation.  08/16/2010 - 08/07/2011 Chemotherapy  5 day Temodar 200 mg/m2  02/19/2018 Progression  Progression of non-enhancing tumor compared to 2013. Recommend initiating metronomic daily Temodar 50 mg/m2/day.  02/12/20 Progression Initiates Tibsovo 500mg  daily  10/21/20 Progression Adds oral CCNU 90mg /m2 q6 weeks to daily Tibsovo  08/06/22 Progression Repeat IMRT with concurrent Avastin 10mg /kg IV q2 weeks  Interval History: Robert Watkins presents today for initiation of avastin.  Radiation with Dr. Basilio Cairo started yesterday, tolerated it well.  No clinical changes today.  Burden of fatigue is stable.  No headaches or seizures.  He remains active and functionally independent. Continues to work full time, may be adjusting work schedule to accommodate radiation.  Medications: Current Outpatient  Medications on File Prior to Visit  Medication Sig Dispense Refill   ivosidenib (TIBSOVO) 250 MG tablet Take 2 tablets (500 mg total) by mouth daily. 60 tablet 0   lamoTRIgine (LAMICTAL) 100 MG tablet Take 1 tablet (100 mg total) by mouth daily. 60 tablet 1   No current facility-administered medications on file prior to visit.    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 Recommend radiation therapy with concurrent Temozolomide followed by twelve cycles of five-day Temozolomide. ?   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 Lavonna Monarch. Pathology confirms anaplastic astrocytoma. ?   CRANIOTOMY FOR STEREOTACTIC GUIDED SURGERY Left 05/12/2010   Stereotactic biopsy of left occipital lesion by Dr. Barnett Abu in [place]. Pathology demonstrates anaplastic astrocytoma (WHO III)?   Social History:  Social History   Socioeconomic History   Marital status: Single    Spouse name: Not on file   Number of children: Not on file   Years of education: Not on file   Highest education level: Not on file  Occupational History   Not on file  Tobacco Use   Smoking status: Former   Smokeless tobacco: Never  Substance and Sexual Activity   Alcohol use: Yes    Alcohol/week: 4.0  standard drinks of alcohol    Types: 4 Shots of liquor per week   Drug use: Yes    Types: Marijuana   Sexual  activity: Yes  Other Topics Concern   Not on file  Social History Narrative   Not on file   Social Determinants of Health   Financial Resource Strain: Not on file  Food Insecurity: No Food Insecurity (08/21/2022)   Hunger Vital Sign    Worried About Running Out of Food in the Last Year: Never true    Ran Out of Food in the Last Year: Never true  Transportation Needs: No Transportation Needs (08/21/2022)   PRAPARE - Administrator, Civil Service (Medical): No    Lack of Transportation (Non-Medical): No  Physical Activity: Not on file  Stress: Not on file  Social Connections: Not on file  Intimate Partner Violence: Not At Risk (08/21/2022)   Humiliation, Afraid, Rape, and Kick questionnaire    Fear of Current or Ex-Partner: No    Emotionally Abused: No    Physically Abused: No    Sexually Abused: No   Family History: No family history on file.  Review of Systems: Constitutional: Denies fevers, chills or abnormal weight loss Eyes: Denies blurriness of vision Ears, nose, mouth, throat, and face: Denies mucositis or sore throat Respiratory: Denies cough, dyspnea or wheezes Cardiovascular: Denies palpitation, chest discomfort or lower extremity swelling Gastrointestinal:  Denies nausea, constipation, diarrhea GU: Denies dysuria or incontinence Skin: Denies abnormal skin rashes Neurological: Per HPI Musculoskeletal: Denies joint pain, back or neck discomfort. No decrease in ROM Behavioral/Psych: Denies anxiety, disturbance in thought content, and mood instability  Physical Exam: Vitals:   08/29/22 0923  BP: 115/63  Pulse: (!) 45  Resp: 16  Temp: 97.9 F (36.6 C)  SpO2: 100%    KPS: 90. General: Alert, cooperative, pleasant, in no acute distress Head: Normal EENT: No conjunctival injection or scleral icterus. Oral mucosa moist Lungs: Resp effort normal Cardiac: Regular rate and rhythm Abdomen: Soft, non-distended abdomen Skin: No rashes cyanosis or  petechiae. Extremities: No clubbing or edema  Neurologic Exam: Mental Status: Awake, alert, attentive to examiner. Oriented to self and environment. Language is fluent with intact comprehension.  Cranial Nerves: Visual acuity is grossly normal. Visual fields are full. Extra-ocular movements intact. No ptosis. Face is symmetric, tongue midline. Motor: Tone and bulk are normal. Power is full in both arms and legs. Reflexes are symmetric, no pathologic reflexes present. Intact finger to nose bilaterally Sensory: Intact to light touch and temperature Gait: Normal and tandem gait is normal.   Labs: I have reviewed the data as listed    Component Value Date/Time   NA 141 08/08/2022 1053   K 4.2 08/08/2022 1053   CL 108 08/08/2022 1053   CO2 29 08/08/2022 1053   GLUCOSE 98 08/08/2022 1053   BUN 20 08/08/2022 1053   CREATININE 1.09 08/08/2022 1053   CALCIUM 9.4 08/08/2022 1053   PROT 6.4 (L) 08/08/2022 1053   ALBUMIN 4.3 08/08/2022 1053   AST 55 (H) 08/08/2022 1053   ALT 47 (H) 08/08/2022 1053   ALKPHOS 53 08/08/2022 1053   BILITOT 0.9 08/08/2022 1053   GFRNONAA >60 08/08/2022 1053   GFRAA >60 08/19/2018 0854   Lab Results  Component Value Date   WBC 3.1 (L) 08/29/2022   NEUTROABS 2.4 08/29/2022   HGB 13.7 08/29/2022   HCT 36.7 (L) 08/29/2022   MCV 100.0 08/29/2022   PLT 98 (L) 08/29/2022  Imaging:  [facility] Clinician Interpretation: I have personally reviewed the CNS images as listed.  My interpretation, in the context of the patient's clinical presentation, is stable disease  MR Brain W Wo Contrast  Result Date: 08/20/2022 CLINICAL DATA:  Radiation treatment planning scan for progressive disease EXAM: MRI HEAD WITHOUT AND WITH CONTRAST TECHNIQUE: Multiplanar, multiecho pulse sequences of the brain and surrounding structures were obtained without and with intravenous contrast. CONTRAST:  7mL GADAVIST GADOBUTROL 1 MMOL/ML IV SOLN COMPARISON:  Brain MRI 08/03/2022, 06/01/22,  04/12/22 FINDINGS: Brain: Negative for an acute infarct. Small punctate focus microhemorrhage in the left cerebellar hemisphere. There are innumerable foci of susceptibility artifact left parietal, occipital, and posterior temporal lobes. These are unchanged compared to 08/03/2022, but new compared to 04/12/2022.These are likely related to prior radiation. There is progressive increase the degree of T2/FLAIR hyperintense signal abnormality in the region of the left thalamus. This is best seen extending anteriorly along region of the left external capsule. Contrast enhancement in this region has also markedly increased compared to the most recent prior exam dated 08/03/2022, now with new contrast enhancement extending across midline. There is also increased contrast enhancement along the ependymal margin of the left lateral ventricular system. The exact size of the tumor is difficult to measure due to its orientation, but measures up to 3.3 x 2.1 cm in the greatest axial dimension, previously 2.0 x 1.6 cm, when measured in a similar orientation. Redemonstrated is a previously treated lesion in the left occipital lobe with a faint focus of enhancement along the anterior margin, unchanged compared to 12/12/2022. Vascular: Normal flow voids.  Right frontal DVA. Skull and upper cervical spine: Status post left parieto-occipital craniotomy. Sinuses/Orbits: No middle ear or mastoid effusion. Paranasal sinuses are clear. Orbits are unremarkable. Other: None. IMPRESSION: 1. Progressive increase in contrast enhancement and T2/FLAIR hyperintense signal abnormality in the region of the left thalamus, as above. Of note, there is now extension of tumor across midline and increased contrast enhancement along the ependymal margin of the left lateral ventricular system. 2. Unchanged previously treated lesion in the left occipital lobe with a faint focus of enhancement along the anterior margin, unchanged compared to 12/12/2022.  Electronically Signed   By: Lorenza Cambridge M.D.   On: 08/20/2022 20:50   MR BRAIN W WO CONTRAST  Result Date: 08/06/2022 CLINICAL DATA:  Anaplastic astrocytoma progressive signal abnormality in the left thalamus concerning for recent tumor recurrence. Restaging. EXAM: MRI HEAD WITHOUT AND WITH CONTRAST TECHNIQUE: Multiplanar, multiecho pulse sequences of the brain and surrounding structures were obtained without and with intravenous contrast. CONTRAST:  8 mL Vueway COMPARISON:  Multiple prior MRI of the head without and with contrast including 06/01/2022, 04/12/2022 and 02/17/2022 FINDINGS: Brain: The enhancing lesion in extending superiorly from the posterior left thalamus demonstrates significant increase in size since the prior exam. It now measures 28 x 21 x 14 mm (AP x CC x TR). The associated T2 signal changes have also progressed. Early extension of the posterior limb of the internal capsule is noted. Previously treated tumor in the left occipital lobe is stable. No distal lesions are present. No acute infarct is present. Ventricles are of normal size. No significant extra-axial fluid collection is present. The brainstem and cerebellum are within normal limits. The internal auditory canals are within normal limits. Midline structures are within normal limits. Vascular: Flow is present in the major intracranial arteries. Skull and upper cervical spine: The craniocervical junction is normal. Upper cervical spine is  within normal limits. Marrow signal is unremarkable. Sinuses/Orbits: The paranasal sinuses and mastoid air cells are clear. The globes and orbits are within normal limits. Other: IMPRESSION: 1. Significant increase in size of enhancing lesion extending superiorly from the posterior left thalamus. It now measures 28 x 21 x 14 mm. The associated T2 signal changes have also progressed. Findings are consistent with progression of recurrent tumor. 2. Previously treated tumor in the left occipital lobe  is stable. 3. No acute intracranial abnormality. Electronically Signed   By: Marin Roberts M.D.   On: 08/06/2022 21:09     Assessment/Plan 1. Anaplastic astrocytoma [facility])  Mr. Sivers is clinically stable today.  For progressive astrocytoma, fourth line therapy will consist of repeat IMRT.  Avastin will be administered given high risk of radio-inflammatory syndrome.  Tibsovo will be held during radiation.  We ultimately recommended proceeding with course of intensity modulated radiation therapy and concurrent IV avastin 10mg /kg q2 weeks.  Radiation will be administered Mon-Fri over 6 weeks.  We reviewed side effects of avastin, including hypertension, bleeding/clotting events, wound healing impairment, and cytopenias.  Informed consent was verbally obtained at bedside to proceed with avastin.  Avastin should be held for the following:  ANC less than 500  Platelets less than 50,000  LFT or creatinine greater than 2x ULN  If clinical concerns/contraindications develop  Every 2 weeks during radiation, labs will be checked accompanied by a clinical evaluation in the brain tumor clinic on infusion days.  Will con't Lamictal 100mg  daily.  We appreciate the opportunity to participate in the care of Robert Watkins.    All questions were answered. The patient knows to call the clinic with any problems, questions or concerns. No barriers to learning were detected.  I have spent a total of 40 minutes of face-to-face and non-face-to-face time, excluding clinical staff time, preparing to see patient, ordering tests and/or medications, counseling the patient, and independently interpreting results and communicating results to the patient/family/caregiver    Henreitta Leber, MD Medical Director of Neuro-Oncology [facility] at [place] 08/29/22 9:18 AM

## 2022-08-30 ENCOUNTER — Ambulatory Visit: Payer: Managed Care, Other (non HMO) | Admitting: Radiation Oncology

## 2022-08-30 ENCOUNTER — Ambulatory Visit: Payer: Managed Care, Other (non HMO)

## 2022-08-30 ENCOUNTER — Ambulatory Visit
Admission: RE | Admit: 2022-08-30 | Discharge: 2022-08-30 | Disposition: A | Discharge status: Home / Self Care | Payer: Managed Care, Other (non HMO) | Source: Ambulatory Visit | Attending: Radiation Oncology | Admitting: Radiation Oncology

## 2022-08-30 ENCOUNTER — Other Ambulatory Visit: Payer: Self-pay

## 2022-08-30 ENCOUNTER — Telehealth: Payer: Self-pay | Admitting: *Deleted

## 2022-08-30 DIAGNOSIS — Z5112 Encounter for antineoplastic immunotherapy: Secondary | ICD-10-CM | POA: Diagnosis not present

## 2022-08-30 LAB — RAD ONC ARIA SESSION SUMMARY
Course Elapsed Days: 2
Plan Fractions Treated to Date: 3
Plan Prescribed Dose Per Fraction: 1.8 Gy
Plan Total Fractions Prescribed: 29
Plan Total Prescribed Dose: 52.2 Gy
Reference Point Dosage Given to Date: 5.4 Gy
Reference Point Session Dosage Given: 1.8 Gy
Session Number: 3

## 2022-08-30 NOTE — Telephone Encounter (Signed)
LM for [facility] regarding chemo follow up . To call back if he has any questions or concerns.

## 2022-08-30 NOTE — Telephone Encounter (Signed)
-----   Message from Joella Prince, RN sent at 08/29/2022 11:56 AM EDT ----- Regarding: first time MVASI. Pt of Dr Barbaraann Cao Received first time MVASI today. PT of Dr Barbaraann Cao. Tolerated well with no adverse symptoms. Please follow up with him, thank you!

## 2022-08-31 ENCOUNTER — Other Ambulatory Visit: Payer: Self-pay

## 2022-08-31 ENCOUNTER — Ambulatory Visit
Admission: RE | Admit: 2022-08-31 | Discharge: 2022-08-31 | Disposition: A | Discharge status: Home / Self Care | Payer: Managed Care, Other (non HMO) | Source: Ambulatory Visit | Attending: Radiation Oncology | Admitting: Radiation Oncology

## 2022-08-31 DIAGNOSIS — Z5112 Encounter for antineoplastic immunotherapy: Secondary | ICD-10-CM | POA: Diagnosis not present

## 2022-08-31 LAB — RAD ONC ARIA SESSION SUMMARY
Course Elapsed Days: 3
Plan Fractions Treated to Date: 4
Plan Prescribed Dose Per Fraction: 1.8 Gy
Plan Total Fractions Prescribed: 29
Plan Total Prescribed Dose: 52.2 Gy
Reference Point Dosage Given to Date: 7.2 Gy
Reference Point Session Dosage Given: 1.8 Gy
Session Number: 4

## 2022-09-01 ENCOUNTER — Other Ambulatory Visit: Payer: Self-pay

## 2022-09-01 ENCOUNTER — Other Ambulatory Visit: Payer: Self-pay | Admitting: *Deleted

## 2022-09-01 ENCOUNTER — Telehealth: Payer: Self-pay | Admitting: Internal Medicine

## 2022-09-01 ENCOUNTER — Ambulatory Visit
Admission: RE | Admit: 2022-09-01 | Discharge: 2022-09-01 | Disposition: A | Discharge status: Home / Self Care | Payer: Managed Care, Other (non HMO) | Source: Ambulatory Visit | Attending: Radiation Oncology | Admitting: Radiation Oncology

## 2022-09-01 ENCOUNTER — Ambulatory Visit: Payer: Managed Care, Other (non HMO) | Admitting: Radiation Oncology

## 2022-09-01 DIAGNOSIS — Z5112 Encounter for antineoplastic immunotherapy: Secondary | ICD-10-CM | POA: Diagnosis not present

## 2022-09-01 DIAGNOSIS — C719 Malignant neoplasm of brain, unspecified: Secondary | ICD-10-CM

## 2022-09-01 LAB — RAD ONC ARIA SESSION SUMMARY
Course Elapsed Days: 4
Plan Fractions Treated to Date: 5
Plan Prescribed Dose Per Fraction: 1.8 Gy
Plan Total Fractions Prescribed: 29
Plan Total Prescribed Dose: 52.2 Gy
Reference Point Dosage Given to Date: 9 Gy
Reference Point Session Dosage Given: 1.8 Gy
Session Number: 5

## 2022-09-01 NOTE — Telephone Encounter (Signed)
Scheduled per 05/06 los, patient has been called and notified. 

## 2022-09-04 ENCOUNTER — Ambulatory Visit
Admission: RE | Admit: 2022-09-04 | Discharge: 2022-09-04 | Disposition: A | Discharge status: Home / Self Care | Payer: Managed Care, Other (non HMO) | Source: Ambulatory Visit | Attending: Radiation Oncology | Admitting: Radiation Oncology

## 2022-09-04 ENCOUNTER — Ambulatory Visit: Payer: Managed Care, Other (non HMO)

## 2022-09-04 ENCOUNTER — Other Ambulatory Visit: Payer: Self-pay

## 2022-09-04 ENCOUNTER — Ambulatory Visit: Payer: Managed Care, Other (non HMO) | Admitting: Radiation Oncology

## 2022-09-04 DIAGNOSIS — Z5112 Encounter for antineoplastic immunotherapy: Secondary | ICD-10-CM | POA: Diagnosis not present

## 2022-09-04 LAB — RAD ONC ARIA SESSION SUMMARY
Course Elapsed Days: 7
Plan Fractions Treated to Date: 6
Plan Prescribed Dose Per Fraction: 1.8 Gy
Plan Total Fractions Prescribed: 29
Plan Total Prescribed Dose: 52.2 Gy
Reference Point Dosage Given to Date: 10.8 Gy
Reference Point Session Dosage Given: 1.8 Gy
Session Number: 6

## 2022-09-05 ENCOUNTER — Inpatient Hospital Stay: Payer: Managed Care, Other (non HMO) | Admitting: Internal Medicine

## 2022-09-05 ENCOUNTER — Inpatient Hospital Stay: Payer: Managed Care, Other (non HMO)

## 2022-09-05 ENCOUNTER — Other Ambulatory Visit: Payer: Self-pay

## 2022-09-05 ENCOUNTER — Ambulatory Visit
Admission: RE | Admit: 2022-09-05 | Discharge: 2022-09-05 | Disposition: A | Discharge status: Home / Self Care | Payer: Managed Care, Other (non HMO) | Source: Ambulatory Visit | Attending: Radiation Oncology | Admitting: Radiation Oncology

## 2022-09-05 DIAGNOSIS — Z5112 Encounter for antineoplastic immunotherapy: Secondary | ICD-10-CM | POA: Diagnosis not present

## 2022-09-05 LAB — RAD ONC ARIA SESSION SUMMARY
Course Elapsed Days: 8
Plan Fractions Treated to Date: 7
Plan Prescribed Dose Per Fraction: 1.8 Gy
Plan Total Fractions Prescribed: 29
Plan Total Prescribed Dose: 52.2 Gy
Reference Point Dosage Given to Date: 12.6 Gy
Reference Point Session Dosage Given: 1.8 Gy
Session Number: 7

## 2022-09-06 ENCOUNTER — Ambulatory Visit
Admission: RE | Admit: 2022-09-06 | Discharge: 2022-09-06 | Disposition: A | Discharge status: Home / Self Care | Payer: Managed Care, Other (non HMO) | Source: Ambulatory Visit | Attending: Radiation Oncology | Admitting: Radiation Oncology

## 2022-09-06 ENCOUNTER — Ambulatory Visit: Payer: Managed Care, Other (non HMO) | Admitting: Radiation Oncology

## 2022-09-06 ENCOUNTER — Other Ambulatory Visit: Payer: Self-pay

## 2022-09-06 DIAGNOSIS — Z5112 Encounter for antineoplastic immunotherapy: Secondary | ICD-10-CM | POA: Diagnosis not present

## 2022-09-06 LAB — RAD ONC ARIA SESSION SUMMARY
Course Elapsed Days: 9
Plan Fractions Treated to Date: 8
Plan Prescribed Dose Per Fraction: 1.8 Gy
Plan Total Fractions Prescribed: 29
Plan Total Prescribed Dose: 52.2 Gy
Reference Point Dosage Given to Date: 14.4 Gy
Reference Point Session Dosage Given: 1.8 Gy
Session Number: 8

## 2022-09-07 ENCOUNTER — Ambulatory Visit
Admission: RE | Admit: 2022-09-07 | Discharge: 2022-09-07 | Disposition: A | Discharge status: Home / Self Care | Payer: Managed Care, Other (non HMO) | Source: Ambulatory Visit | Attending: Radiation Oncology | Admitting: Radiation Oncology

## 2022-09-07 ENCOUNTER — Other Ambulatory Visit: Payer: Self-pay

## 2022-09-07 DIAGNOSIS — Z5112 Encounter for antineoplastic immunotherapy: Secondary | ICD-10-CM | POA: Diagnosis not present

## 2022-09-07 LAB — RAD ONC ARIA SESSION SUMMARY
Course Elapsed Days: 10
Plan Fractions Treated to Date: 9
Plan Prescribed Dose Per Fraction: 1.8 Gy
Plan Total Fractions Prescribed: 29
Plan Total Prescribed Dose: 52.2 Gy
Reference Point Dosage Given to Date: 16.2 Gy
Reference Point Session Dosage Given: 1.8 Gy
Session Number: 9

## 2022-09-08 ENCOUNTER — Other Ambulatory Visit: Payer: Self-pay

## 2022-09-08 ENCOUNTER — Ambulatory Visit
Admission: RE | Admit: 2022-09-08 | Discharge: 2022-09-08 | Disposition: A | Discharge status: Home / Self Care | Payer: Managed Care, Other (non HMO) | Source: Ambulatory Visit | Attending: Radiation Oncology | Admitting: Radiation Oncology

## 2022-09-08 ENCOUNTER — Ambulatory Visit: Payer: Managed Care, Other (non HMO) | Admitting: Radiation Oncology

## 2022-09-08 DIAGNOSIS — Z5112 Encounter for antineoplastic immunotherapy: Secondary | ICD-10-CM | POA: Diagnosis not present

## 2022-09-08 LAB — RAD ONC ARIA SESSION SUMMARY
Course Elapsed Days: 11
Plan Fractions Treated to Date: 10
Plan Prescribed Dose Per Fraction: 1.8 Gy
Plan Total Fractions Prescribed: 29
Plan Total Prescribed Dose: 52.2 Gy
Reference Point Dosage Given to Date: 18 Gy
Reference Point Session Dosage Given: 1.8 Gy
Session Number: 10

## 2022-09-11 ENCOUNTER — Other Ambulatory Visit: Payer: Self-pay

## 2022-09-11 ENCOUNTER — Ambulatory Visit
Admission: RE | Admit: 2022-09-11 | Discharge: 2022-09-11 | Disposition: A | Discharge status: Home / Self Care | Payer: Managed Care, Other (non HMO) | Source: Ambulatory Visit | Attending: Radiation Oncology | Admitting: Radiation Oncology

## 2022-09-11 DIAGNOSIS — Z5112 Encounter for antineoplastic immunotherapy: Secondary | ICD-10-CM | POA: Diagnosis not present

## 2022-09-11 LAB — RAD ONC ARIA SESSION SUMMARY
Course Elapsed Days: 14
Plan Fractions Treated to Date: 11
Plan Prescribed Dose Per Fraction: 1.8 Gy
Plan Total Fractions Prescribed: 29
Plan Total Prescribed Dose: 52.2 Gy
Reference Point Dosage Given to Date: 19.8 Gy
Reference Point Session Dosage Given: 1.8 Gy
Session Number: 11

## 2022-09-12 ENCOUNTER — Inpatient Hospital Stay: Payer: Managed Care, Other (non HMO)

## 2022-09-12 ENCOUNTER — Inpatient Hospital Stay (HOSPITAL_BASED_OUTPATIENT_CLINIC_OR_DEPARTMENT_OTHER): Payer: Managed Care, Other (non HMO) | Admitting: Internal Medicine

## 2022-09-12 ENCOUNTER — Other Ambulatory Visit: Payer: Self-pay

## 2022-09-12 ENCOUNTER — Ambulatory Visit
Admission: RE | Admit: 2022-09-12 | Discharge: 2022-09-12 | Disposition: A | Discharge status: Home / Self Care | Payer: Managed Care, Other (non HMO) | Source: Ambulatory Visit | Attending: Radiation Oncology | Admitting: Radiation Oncology

## 2022-09-12 VITALS — BP 122/73 | HR 47 | Temp 97.7°F | Resp 18 | Ht 71.0 in | Wt 166.3 lb

## 2022-09-12 VITALS — BP 117/68 | HR 49 | Resp 16

## 2022-09-12 DIAGNOSIS — C719 Malignant neoplasm of brain, unspecified: Secondary | ICD-10-CM

## 2022-09-12 DIAGNOSIS — G40909 Epilepsy, unspecified, not intractable, without status epilepticus: Secondary | ICD-10-CM | POA: Diagnosis not present

## 2022-09-12 DIAGNOSIS — Z5112 Encounter for antineoplastic immunotherapy: Secondary | ICD-10-CM | POA: Diagnosis not present

## 2022-09-12 LAB — RAD ONC ARIA SESSION SUMMARY
Course Elapsed Days: 15
Plan Fractions Treated to Date: 12
Plan Prescribed Dose Per Fraction: 1.8 Gy
Plan Total Fractions Prescribed: 29
Plan Total Prescribed Dose: 52.2 Gy
Reference Point Dosage Given to Date: 21.6 Gy
Reference Point Session Dosage Given: 1.8 Gy
Session Number: 12

## 2022-09-12 LAB — CMP (CANCER CENTER ONLY)
ALT: 36 U/L (ref 0–44)
AST: 24 U/L (ref 15–41)
Albumin: 4.6 g/dL (ref 3.5–5.0)
Alkaline Phosphatase: 64 U/L (ref 38–126)
Anion gap: 5 (ref 5–15)
BUN: 20 mg/dL (ref 6–20)
CO2: 29 mmol/L (ref 22–32)
Calcium: 9.5 mg/dL (ref 8.9–10.3)
Chloride: 105 mmol/L (ref 98–111)
Creatinine: 1.19 mg/dL (ref 0.61–1.24)
GFR, Estimated: >60 mL/min (ref >60)
Glucose, Bld: 121 mg/dL — ABNORMAL HIGH (ref 70–99)
Potassium: 4.4 mmol/L (ref 3.5–5.1)
Sodium: 139 mmol/L (ref 135–145)
Total Bilirubin: 1 mg/dL (ref 0.3–1.2)
Total Protein: 6.7 g/dL (ref 6.5–8.1)

## 2022-09-12 LAB — CBC WITH DIFFERENTIAL (CANCER CENTER ONLY)
Abs Immature Granulocytes: 0.01 10*3/uL (ref 0.00–0.07)
Basophils Absolute: 0 10*3/uL (ref 0.0–0.1)
Basophils Relative: 1 %
Eosinophils Absolute: 0.1 10*3/uL (ref 0.0–0.5)
Eosinophils Relative: 1 %
Hemoglobin: 15.4 g/dL (ref 13.0–17.0)
Immature Granulocytes: 0 %
Lymphocytes Relative: 12 %
Lymphs Abs: 0.5 10*3/uL — ABNORMAL LOW (ref 0.7–4.0)
Monocytes Absolute: 0.2 10*3/uL (ref 0.1–1.0)
Monocytes Relative: 5 %
Neutro Abs: 3.2 10*3/uL (ref 1.7–7.7)
Neutrophils Relative %: 81 %
Platelet Count: 117 10*3/uL — ABNORMAL LOW (ref 150–400)
WBC Count: 3.9 10*3/uL — ABNORMAL LOW (ref 4.0–10.5)

## 2022-09-12 MED ORDER — SODIUM CHLORIDE 0.9 % IV SOLN: Freq: Once | INTRAVENOUS | Status: AC

## 2022-09-12 MED ORDER — SODIUM CHLORIDE 0.9 % IV SOLN
10.0000 mg/kg | Freq: Once | INTRAVENOUS | Status: AC
  Administered 2022-09-12: 800 mg via INTRAVENOUS
  Filled 2022-09-12: qty 32

## 2022-09-12 NOTE — Progress Notes (Signed)
Per Dr Barbaraann Cao ok to proceed without Urine Protein today for Avastin infusion 09/12/2022.

## 2022-09-12 NOTE — Patient Instructions (Signed)
[facility]  Discharge Instructions: Thank you for choosing [facility] to provide your oncology and hematology care.   If you have a lab appointment with the Cancer Center, please go directly to the Cancer Center and check in at the registration area.   Wear comfortable clothing and clothing appropriate for easy access to any Portacath or PICC line.   We strive to give you quality time with your provider. You may need to reschedule your appointment if you arrive late (15 or more minutes).  Arriving late affects you and other patients whose appointments are after yours.  Also, if you miss three or more appointments without notifying the office, you may be dismissed from the clinic at the provider's discretion.      For prescription refill requests, have your pharmacy contact our office and allow 72 hours for refills to be completed.    Today you received the following chemotherapy and/or immunotherapy agent: Bevacizumab (MVASI)   To help prevent nausea and vomiting after your treatment, we encourage you to take your nausea medication as directed.  BELOW ARE SYMPTOMS THAT SHOULD BE REPORTED IMMEDIATELY: *FEVER GREATER THAN 100.4 F (38 C) OR HIGHER *CHILLS OR SWEATING *NAUSEA AND VOMITING THAT IS NOT CONTROLLED WITH YOUR NAUSEA MEDICATION *UNUSUAL SHORTNESS OF BREATH *UNUSUAL BRUISING OR BLEEDING *URINARY PROBLEMS (pain or burning when urinating, or frequent urination) *BOWEL PROBLEMS (unusual diarrhea, constipation, pain near the anus) TENDERNESS IN MOUTH AND THROAT WITH OR WITHOUT PRESENCE OF ULCERS (sore throat, sores in mouth, or a toothache) UNUSUAL RASH, SWELLING OR PAIN  UNUSUAL VAGINAL DISCHARGE OR ITCHING   Items with * indicate a potential emergency and should be followed up as soon as possible or go to the Emergency Department if any problems should occur.  Please show the CHEMOTHERAPY ALERT CARD or IMMUNOTHERAPY ALERT CARD at  check-in to the Emergency Department and triage nurse.  Should you have questions after your visit or need to cancel or reschedule your appointment, please contact [facility]  Dept: [phone]  and follow the prompts.  Office hours are 8:00 a.m. to 4:30 p.m. Monday - Friday. Please note that voicemails left after 4:00 p.m. may not be returned until the following business day.  We are closed weekends and major holidays. You have access to a nurse at all times for urgent questions. Please call the main number to the clinic Dept: [phone] and follow the prompts.   For any non-urgent questions, you may also contact your provider using MyChart. We now offer e-Visits for anyone 18 and older to request care online for non-urgent symptoms. For details visit [email or web address].   Also download the MyChart app! Go to the app store, search "MyChart", open the app, select [place], and log in with your MyChart username and password.  Bevacizumab Injection What is this medication? BEVACIZUMAB (be va SIZ yoo mab) treats some types of cancer. It works by blocking a protein that causes cancer cells to grow and multiply. This helps to slow or stop the spread of cancer cells. It is a monoclonal antibody. This medicine may be used for other purposes; ask your health care provider or pharmacist if you have questions. COMMON BRAND NAME(S): Alymsys, Avastin, MVASI, Zirabev What should I tell my care team before I take this medication? They need to know if you have any of these conditions: Blood clots Coughing up blood Having or recent surgery Heart failure High   blood pressure History of a connection between 2 or more body parts that do not usually connect (fistula) History of a tear in your stomach or intestines Protein in your urine An unusual or allergic reaction to bevacizumab, other medications, foods, dyes, or preservatives Pregnant or trying to get  pregnant Breast-feeding How should I use this medication? This medication is injected into a vein. It is given by your care team in a hospital or clinic setting. Talk to your care team the use of this medication in children. Special care may be needed. Overdosage: If you think you have taken too much of this medicine contact a poison control center or emergency room at once. NOTE: This medicine is only for you. Do not share this medicine with others. What if I miss a dose? Keep appointments for follow-up doses. It is important not to miss your dose. Call your care team if you are unable to keep an appointment. What may interact with this medication? Interactions are not expected. This list may not describe all possible interactions. Give your health care provider a list of all the medicines, herbs, non-prescription drugs, or dietary supplements you use. Also tell them if you smoke, drink alcohol, or use illegal drugs. Some items may interact with your medicine. What should I watch for while using this medication? Your condition will be monitored carefully while you are receiving this medication. You may need blood work while taking this medication. This medication may make you feel generally unwell. This is not uncommon as chemotherapy can affect healthy cells as well as cancer cells. Report any side effects. Continue your course of treatment even though you feel ill unless your care team tells you to stop. This medication may increase your risk to bruise or bleed. Call your care team if you notice any unusual bleeding. Before having surgery, talk to your care team to make sure it is ok. This medication can increase the risk of poor healing of your surgical site or wound. You will need to stop this medication for 28 days before surgery. After surgery, wait at least 28 days before restarting this medication. Make sure the surgical site or wound is healed enough before restarting this medication. Talk  to your care team if questions. Talk to your care team if you may be pregnant. Serious birth defects can occur if you take this medication during pregnancy and for 6 months after the last dose. Contraception is recommended while taking this medication and for 6 months after the last dose. Your care team can help you find the option that works for you. Do not breastfeed while taking this medication and for 6 months after the last dose. This medication can cause infertility. Talk to your care team if you are concerned about your fertility. What side effects may I notice from receiving this medication? Side effects that you should report to your care team as soon as possible: Allergic reactions--skin rash, itching, hives, swelling of the face, lips, tongue, or throat Bleeding--bloody or black, tar-like stools, vomiting blood or brown material that looks like coffee grounds, red or dark brown urine, small red or purple spots on skin, unusual bruising or bleeding Blood clot--pain, swelling, or warmth in the leg, shortness of breath, chest pain Heart attack--pain or tightness in the chest, shoulders, arms, or jaw, nausea, shortness of breath, cold or clammy skin, feeling faint or lightheaded Heart failure--shortness of breath, swelling of the ankles, feet, or hands, sudden weight gain, unusual weakness or fatigue Increase   in blood pressure Infection--fever, chills, cough, sore throat, wounds that don't heal, pain or trouble when passing urine, general feeling of discomfort or being unwell Infusion reactions--chest pain, shortness of breath or trouble breathing, feeling faint or lightheaded Kidney injury--decrease in the amount of urine, swelling of the ankles, hands, or feet Stomach pain that is severe, does not go away, or gets worse Stroke--sudden numbness or weakness of the face, arm, or leg, trouble speaking, confusion, trouble walking, loss of balance or coordination, dizziness, severe headache,  change in vision Sudden and severe headache, confusion, change in vision, seizures, which may be signs of posterior reversible encephalopathy syndrome (PRES) Side effects that usually do not require medical attention (report to your care team if they continue or are bothersome): Back pain Change in taste Diarrhea Dry skin Increased tears Nosebleed This list may not describe all possible side effects. Call your doctor for medical advice about side effects. You may report side effects to FDA at 1-800-FDA-1088. Where should I keep my medication? This medication is given in a hospital or clinic. It will not be stored at home. NOTE: This sheet is a summary. It may not cover all possible information. If you have questions about this medicine, talk to your doctor, pharmacist, or health care provider.  2023 Elsevier/Gold Standard (2021-08-12 00:00:00)    

## 2022-09-12 NOTE — Progress Notes (Signed)
[facility] [address]  [place], [place] [address] [phone]   Interval Evaluation  Date of Service: 09/12/22 Patient Name: Robert Watkins Patient MRN: [id number] Patient DOB: 06-Dec-1985 Provider: Henreitta Leber, MD  Identifying Statement:  Robert Watkins is a 37 y.o. 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 Barnett Abu in [place]. 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 Lavonna Monarch. Pathology confirms anaplastic astrocytoma.   06/01/2010 Clinical Event-Other  New patient evaluation at [facility]. Recommend radiation therapy with concurrent Temozolomide followed by twelve cycles of five-day Temozolomide.   06/13/2010 - 08/01/2010 Radiation  Radiation with Temodar.  06/13/2010 - 08/01/2010 Chemotherapy  Temodar 75 mg/m2 with radiation.  08/16/2010 - 08/07/2011 Chemotherapy  5 day Temodar 200 mg/m2  02/19/2018 Progression  Progression of non-enhancing tumor compared to 2013. Recommend initiating metronomic daily Temodar 50 mg/m2/day.  02/12/20 Progression Initiates Tibsovo 500mg  daily  10/21/20 Progression Adds oral CCNU 90mg /m2 q6 weeks to daily Tibsovo  08/06/22 Progression Repeat IMRT with concurrent Avastin 10mg /kg IV q2 weeks  Interval History: Julie Hennion presents today for follow up, now having completed 2 weeks of IMRT and avastin.  Tolerating treatment well overall. No clinical changes today.  Burden of fatigue is stable.  No headaches or seizures.  He remains active and functionally independent. Continues to work full time as prior.  Medications: Current Outpatient Medications on File Prior to Visit   Medication Sig Dispense Refill   ivosidenib (TIBSOVO) 250 MG tablet Take 2 tablets (500 mg total) by mouth daily. 60 tablet 0   lamoTRIgine (LAMICTAL) 100 MG tablet Take 1 tablet (100 mg total) by mouth daily. 60 tablet 1   No current facility-administered medications on file prior to visit.    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 Recommend radiation therapy with concurrent Temozolomide followed by twelve cycles of five-day Temozolomide. ?   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 Lavonna Monarch. Pathology confirms anaplastic astrocytoma. ?   CRANIOTOMY FOR STEREOTACTIC GUIDED SURGERY Left 05/12/2010   Stereotactic biopsy of left occipital lesion by Dr. Barnett Abu in [place]. Pathology demonstrates anaplastic astrocytoma (WHO III)?   Social History:  Social History   Socioeconomic History   Marital status: Single    Spouse name: Not on file   Number of children: Not on file   Years of education: Not on file   Highest education level: Not on file  Occupational History   Not on file  Tobacco Use   Smoking status: Former   Smokeless tobacco: Never  Substance and Sexual Activity   Alcohol use: Yes    Alcohol/week: 4.0 standard drinks of alcohol  Types: 4 Shots of liquor per week   Drug use: Yes    Types: Marijuana   Sexual activity: Yes  Other Topics Concern    Not on file  Social History Narrative   Not on file   Social Determinants of Health   Financial Resource Strain: Not on file  Food Insecurity: No Food Insecurity (08/21/2022)   Hunger Vital Sign    Worried About Running Out of Food in the Last Year: Never true    Ran Out of Food in the Last Year: Never true  Transportation Needs: No Transportation Needs (08/21/2022)   PRAPARE - Administrator, Civil Service (Medical): No    Lack of Transportation (Non-Medical): No  Physical Activity: Not on file  Stress: Not on file  Social Connections: Not on file  Intimate Partner Violence: Not At Risk (08/21/2022)   Humiliation, Afraid, Rape, and Kick questionnaire    Fear of Current or Ex-Partner: No    Emotionally Abused: No    Physically Abused: No    Sexually Abused: No   Family History: No family history on file.  Review of Systems: Constitutional: Denies fevers, chills or abnormal weight loss Eyes: Denies blurriness of vision Ears, nose, mouth, throat, and face: Denies mucositis or sore throat Respiratory: Denies cough, dyspnea or wheezes Cardiovascular: Denies palpitation, chest discomfort or lower extremity swelling Gastrointestinal:  Denies nausea, constipation, diarrhea GU: Denies dysuria or incontinence Skin: Denies abnormal skin rashes Neurological: Per HPI Musculoskeletal: Denies joint pain, back or neck discomfort. No decrease in ROM Behavioral/Psych: Denies anxiety, disturbance in thought content, and mood instability  Physical Exam: Vitals:   09/12/22 1403  BP: 122/73  Pulse: (!) 47  Resp: 18  Temp: 97.7 F (36.5 C)  SpO2: 100%   KPS: 90. General: Alert, cooperative, pleasant, in no acute distress Head: Normal EENT: No conjunctival injection or scleral icterus. Oral mucosa moist Lungs: Resp effort normal Cardiac: Regular rate and rhythm Abdomen: Soft, non-distended abdomen Skin: No rashes cyanosis or petechiae. Extremities: No clubbing or  edema  Neurologic Exam: Mental Status: Awake, alert, attentive to examiner. Oriented to self and environment. Language is fluent with intact comprehension.  Cranial Nerves: Visual acuity is grossly normal. Visual fields are full. Extra-ocular movements intact. No ptosis. Face is symmetric, tongue midline. Motor: Tone and bulk are normal. Power is full in both arms and legs. Reflexes are symmetric, no pathologic reflexes present. Intact finger to nose bilaterally Sensory: Intact to light touch and temperature Gait: Normal and tandem gait is normal.   Labs: I have reviewed the data as listed    Component Value Date/Time   NA 141 08/08/2022 1053   K 4.2 08/08/2022 1053   CL 108 08/08/2022 1053   CO2 29 08/08/2022 1053   GLUCOSE 98 08/08/2022 1053   BUN 20 08/08/2022 1053   CREATININE 1.09 08/08/2022 1053   CALCIUM 9.4 08/08/2022 1053   PROT 6.4 (L) 08/08/2022 1053   ALBUMIN 4.3 08/08/2022 1053   AST 55 (H) 08/08/2022 1053   ALT 47 (H) 08/08/2022 1053   ALKPHOS 53 08/08/2022 1053   BILITOT 0.9 08/08/2022 1053   GFRNONAA >60 08/08/2022 1053   GFRAA >60 08/19/2018 0854   Lab Results  Component Value Date   WBC 3.1 (L) 08/29/2022   NEUTROABS 2.4 08/29/2022   HGB 13.7 08/29/2022   HCT 36.7 (L) 08/29/2022   MCV 100.0 08/29/2022   PLT 98 (L) 08/29/2022    Assessment/Plan 1. Anaplastic astrocytoma (HCC)  Mr. Wittman is clinically stable today.  He has completed 2 weeks of repeat IMRT.  Avastin will be administered given high risk of radio-inflammatory syndrome, second infusion today.  Tibsovo will continue to be held during radiation.  We ultimately recommended continuing with course of intensity modulated radiation therapy and concurrent IV avastin 10mg /kg q2 weeks.  Radiation will be administered Mon-Fri over 6 weeks.  We reviewed side effects of avastin, including hypertension, bleeding/clotting events, wound healing impairment, and cytopenias.  Avastin should be held for the  following:  ANC less than 500  Platelets less than 50,000  LFT or creatinine greater than 2x ULN  If clinical concerns/contraindications develop  Every 2 weeks during radiation, labs will be checked accompanied by a clinical evaluation in the brain tumor clinic on infusion days.  Will con't Lamictal 100mg  daily.  We appreciate the opportunity to participate in the care of Romulus Crafts.    All questions were answered. The patient knows to call the clinic with any problems, questions or concerns. No barriers to learning were detected.  I have spent a total of 30 minutes of face-to-face and non-face-to-face time, excluding clinical staff time, preparing to see patient, ordering tests and/or medications, counseling the patient, and independently interpreting results and communicating results to the patient/family/caregiver    Henreitta Leber, MD Medical Director of Neuro-Oncology [facility] 09/12/22 1:59 PM

## 2022-09-12 NOTE — Progress Notes (Signed)
Per Dr. Barbaraann Cao, ok to proceed with treatment without complete CBC results.

## 2022-09-13 ENCOUNTER — Other Ambulatory Visit: Payer: Self-pay

## 2022-09-13 ENCOUNTER — Ambulatory Visit
Admission: RE | Admit: 2022-09-13 | Discharge: 2022-09-13 | Disposition: A | Discharge status: Home / Self Care | Payer: Managed Care, Other (non HMO) | Source: Ambulatory Visit | Attending: Radiation Oncology | Admitting: Radiation Oncology

## 2022-09-13 DIAGNOSIS — Z5112 Encounter for antineoplastic immunotherapy: Secondary | ICD-10-CM | POA: Diagnosis not present

## 2022-09-13 LAB — RAD ONC ARIA SESSION SUMMARY
Course Elapsed Days: 16
Plan Fractions Treated to Date: 13
Plan Prescribed Dose Per Fraction: 1.8 Gy
Plan Total Fractions Prescribed: 29
Plan Total Prescribed Dose: 52.2 Gy
Reference Point Dosage Given to Date: 23.4 Gy
Reference Point Session Dosage Given: 1.8 Gy
Session Number: 13

## 2022-09-14 ENCOUNTER — Other Ambulatory Visit: Payer: Self-pay

## 2022-09-14 ENCOUNTER — Ambulatory Visit
Admission: RE | Admit: 2022-09-14 | Discharge: 2022-09-14 | Disposition: A | Discharge status: Home / Self Care | Payer: Managed Care, Other (non HMO) | Source: Ambulatory Visit | Attending: Radiation Oncology | Admitting: Radiation Oncology

## 2022-09-14 DIAGNOSIS — Z5112 Encounter for antineoplastic immunotherapy: Secondary | ICD-10-CM | POA: Diagnosis not present

## 2022-09-14 LAB — RAD ONC ARIA SESSION SUMMARY
Course Elapsed Days: 17
Plan Fractions Treated to Date: 14
Plan Prescribed Dose Per Fraction: 1.8 Gy
Plan Total Fractions Prescribed: 29
Plan Total Prescribed Dose: 52.2 Gy
Reference Point Dosage Given to Date: 25.2 Gy
Reference Point Session Dosage Given: 1.8 Gy
Session Number: 14

## 2022-09-15 ENCOUNTER — Other Ambulatory Visit: Payer: Self-pay

## 2022-09-15 ENCOUNTER — Ambulatory Visit
Admission: RE | Admit: 2022-09-15 | Discharge: 2022-09-15 | Disposition: A | Discharge status: Home / Self Care | Payer: Managed Care, Other (non HMO) | Source: Ambulatory Visit | Attending: Radiation Oncology | Admitting: Radiation Oncology

## 2022-09-15 DIAGNOSIS — Z5112 Encounter for antineoplastic immunotherapy: Secondary | ICD-10-CM | POA: Diagnosis not present

## 2022-09-15 LAB — RAD ONC ARIA SESSION SUMMARY
Course Elapsed Days: 18
Plan Fractions Treated to Date: 15
Plan Prescribed Dose Per Fraction: 1.8 Gy
Plan Total Fractions Prescribed: 29
Plan Total Prescribed Dose: 52.2 Gy
Reference Point Dosage Given to Date: 27 Gy
Reference Point Session Dosage Given: 1.8 Gy
Session Number: 15

## 2022-09-19 ENCOUNTER — Other Ambulatory Visit: Payer: Self-pay

## 2022-09-19 ENCOUNTER — Ambulatory Visit
Admission: RE | Admit: 2022-09-19 | Discharge: 2022-09-19 | Disposition: A | Discharge status: Home / Self Care | Payer: Managed Care, Other (non HMO) | Source: Ambulatory Visit | Attending: Radiation Oncology | Admitting: Radiation Oncology

## 2022-09-19 DIAGNOSIS — Z5112 Encounter for antineoplastic immunotherapy: Secondary | ICD-10-CM | POA: Diagnosis not present

## 2022-09-19 LAB — RAD ONC ARIA SESSION SUMMARY
Course Elapsed Days: 22
Plan Fractions Treated to Date: 16
Plan Prescribed Dose Per Fraction: 1.8 Gy
Plan Total Fractions Prescribed: 29
Plan Total Prescribed Dose: 52.2 Gy
Reference Point Dosage Given to Date: 28.8 Gy
Reference Point Session Dosage Given: 1.8 Gy
Session Number: 16

## 2022-09-20 ENCOUNTER — Ambulatory Visit
Admission: RE | Admit: 2022-09-20 | Discharge: 2022-09-20 | Disposition: A | Discharge status: Home / Self Care | Payer: Managed Care, Other (non HMO) | Source: Ambulatory Visit | Attending: Radiation Oncology | Admitting: Radiation Oncology

## 2022-09-20 ENCOUNTER — Other Ambulatory Visit: Payer: Self-pay

## 2022-09-20 DIAGNOSIS — Z5112 Encounter for antineoplastic immunotherapy: Secondary | ICD-10-CM | POA: Diagnosis not present

## 2022-09-20 LAB — RAD ONC ARIA SESSION SUMMARY
Course Elapsed Days: 23
Plan Fractions Treated to Date: 17
Plan Prescribed Dose Per Fraction: 1.8 Gy
Plan Total Fractions Prescribed: 29
Plan Total Prescribed Dose: 52.2 Gy
Reference Point Dosage Given to Date: 30.6 Gy
Reference Point Session Dosage Given: 1.8 Gy
Session Number: 17

## 2022-09-21 ENCOUNTER — Other Ambulatory Visit: Payer: Self-pay

## 2022-09-21 ENCOUNTER — Ambulatory Visit
Admission: RE | Admit: 2022-09-21 | Discharge: 2022-09-21 | Disposition: A | Discharge status: Home / Self Care | Payer: Managed Care, Other (non HMO) | Source: Ambulatory Visit | Attending: Radiation Oncology | Admitting: Radiation Oncology

## 2022-09-21 DIAGNOSIS — Z5112 Encounter for antineoplastic immunotherapy: Secondary | ICD-10-CM | POA: Diagnosis not present

## 2022-09-21 LAB — RAD ONC ARIA SESSION SUMMARY
Course Elapsed Days: 24
Plan Fractions Treated to Date: 18
Plan Prescribed Dose Per Fraction: 1.8 Gy
Plan Total Fractions Prescribed: 29
Plan Total Prescribed Dose: 52.2 Gy
Reference Point Dosage Given to Date: 32.4 Gy
Reference Point Session Dosage Given: 1.8 Gy
Session Number: 18

## 2022-09-22 ENCOUNTER — Other Ambulatory Visit: Payer: Self-pay

## 2022-09-22 ENCOUNTER — Telehealth: Payer: Self-pay

## 2022-09-22 ENCOUNTER — Encounter: Payer: Self-pay | Admitting: General Practice

## 2022-09-22 ENCOUNTER — Ambulatory Visit
Admission: RE | Admit: 2022-09-22 | Discharge: 2022-09-22 | Disposition: A | Discharge status: Home / Self Care | Payer: Managed Care, Other (non HMO) | Source: Ambulatory Visit | Attending: Radiation Oncology | Admitting: Radiation Oncology

## 2022-09-22 DIAGNOSIS — Z5112 Encounter for antineoplastic immunotherapy: Secondary | ICD-10-CM | POA: Diagnosis not present

## 2022-09-22 LAB — RAD ONC ARIA SESSION SUMMARY
Course Elapsed Days: 25
Plan Fractions Treated to Date: 19
Plan Prescribed Dose Per Fraction: 1.8 Gy
Plan Total Fractions Prescribed: 29
Plan Total Prescribed Dose: 52.2 Gy
Reference Point Dosage Given to Date: 34.2 Gy
Reference Point Session Dosage Given: 1.8 Gy
Session Number: 19

## 2022-09-22 NOTE — Progress Notes (Signed)
[facility] Spiritual Care Note  Referred by radiation oncology for additional layer of support. Left voicemail of introduction with direct dial number and encouragement to return call. Plan to follow up by phone or in person during treatment if needed.   [address] [place], [place], [facility] Pager [phone] Voicemail [phone]

## 2022-09-22 NOTE — Progress Notes (Signed)
[facility] Spiritual Care Note  Received return call from [facility], providing introduction to Spiritual Care and support programming. Set up an appointment in Spiritual Care office at 12:30 on Monday.   [address] [place], [place], [facility] Pager [phone] Voicemail [phone]

## 2022-09-22 NOTE — Telephone Encounter (Signed)
Called and spoke with patient to let him know that Dr. Basilio Cairo will see him for his weekly check in (PUT) on Wednesday 6/5 instead of the Monday 6/3. Patient verbalized understanding and agreement of plan. Denied any other needs at this time.

## 2022-09-25 ENCOUNTER — Telehealth: Payer: Self-pay

## 2022-09-25 ENCOUNTER — Other Ambulatory Visit: Payer: Self-pay

## 2022-09-25 ENCOUNTER — Ambulatory Visit: Payer: Managed Care, Other (non HMO)

## 2022-09-25 ENCOUNTER — Ambulatory Visit
Admission: RE | Admit: 2022-09-25 | Discharge: 2022-09-25 | Disposition: A | Discharge status: Home / Self Care | Payer: Managed Care, Other (non HMO) | Source: Ambulatory Visit | Attending: Radiation Oncology | Admitting: Radiation Oncology

## 2022-09-25 ENCOUNTER — Encounter: Payer: Self-pay | Admitting: General Practice

## 2022-09-25 DIAGNOSIS — Z9221 Personal history of antineoplastic chemotherapy: Secondary | ICD-10-CM | POA: Insufficient documentation

## 2022-09-25 DIAGNOSIS — Z510 Encounter for antineoplastic radiation therapy: Secondary | ICD-10-CM | POA: Insufficient documentation

## 2022-09-25 DIAGNOSIS — Z923 Personal history of irradiation: Secondary | ICD-10-CM | POA: Insufficient documentation

## 2022-09-25 DIAGNOSIS — Z79899 Other long term (current) drug therapy: Secondary | ICD-10-CM | POA: Insufficient documentation

## 2022-09-25 DIAGNOSIS — Z5112 Encounter for antineoplastic immunotherapy: Secondary | ICD-10-CM | POA: Diagnosis not present

## 2022-09-25 DIAGNOSIS — R5383 Other fatigue: Secondary | ICD-10-CM | POA: Diagnosis not present

## 2022-09-25 DIAGNOSIS — C714 Malignant neoplasm of occipital lobe: Secondary | ICD-10-CM | POA: Insufficient documentation

## 2022-09-25 DIAGNOSIS — G40909 Epilepsy, unspecified, not intractable, without status epilepticus: Secondary | ICD-10-CM | POA: Insufficient documentation

## 2022-09-25 DIAGNOSIS — Z87891 Personal history of nicotine dependence: Secondary | ICD-10-CM | POA: Diagnosis not present

## 2022-09-25 LAB — RAD ONC ARIA SESSION SUMMARY
Course Elapsed Days: 28
Plan Fractions Treated to Date: 20
Plan Prescribed Dose Per Fraction: 1.8 Gy
Plan Total Fractions Prescribed: 29
Plan Total Prescribed Dose: 52.2 Gy
Reference Point Dosage Given to Date: 36 Gy
Reference Point Session Dosage Given: 1.8 Gy
Session Number: 20

## 2022-09-25 NOTE — Telephone Encounter (Signed)
Notified Patient by voicemail of completion of FMLA/Disability Forms. Fax transmission confirmation received. Copy of forms placed for pick-up as requested by Patient.

## 2022-09-25 NOTE — Progress Notes (Signed)
[facility] Spiritual Care Note  Met with Robert Watkins in Spiritual Care office as scheduled, providing opportunity for him to share and process some of his story. In particular, he is wrestling with job discernment because work is such a big part of his life, but his current position is not a strong source of meaning or purpose. This feels even more significant to him because of the likelihood that cancer may limit his lifespan.   Provided reflective listening, normalization of feelings, and emotional support. We plan to follow up by phone in order to set another time to meet.   [address] [place], [place], [facility] Pager [phone] Voicemail [phone]

## 2022-09-26 ENCOUNTER — Ambulatory Visit
Admission: RE | Admit: 2022-09-26 | Discharge: 2022-09-26 | Disposition: A | Discharge status: Home / Self Care | Payer: Managed Care, Other (non HMO) | Source: Ambulatory Visit | Attending: Radiation Oncology | Admitting: Radiation Oncology

## 2022-09-26 ENCOUNTER — Inpatient Hospital Stay: Payer: Managed Care, Other (non HMO) | Attending: Internal Medicine

## 2022-09-26 ENCOUNTER — Inpatient Hospital Stay (HOSPITAL_BASED_OUTPATIENT_CLINIC_OR_DEPARTMENT_OTHER): Payer: Managed Care, Other (non HMO) | Admitting: Internal Medicine

## 2022-09-26 ENCOUNTER — Other Ambulatory Visit: Payer: Self-pay

## 2022-09-26 ENCOUNTER — Inpatient Hospital Stay: Payer: Managed Care, Other (non HMO)

## 2022-09-26 VITALS — BP 116/68 | HR 53 | Temp 97.8°F | Resp 18 | Ht 71.0 in | Wt 167.1 lb

## 2022-09-26 VITALS — BP 138/71 | HR 56 | Resp 16

## 2022-09-26 DIAGNOSIS — C719 Malignant neoplasm of brain, unspecified: Secondary | ICD-10-CM | POA: Diagnosis not present

## 2022-09-26 DIAGNOSIS — Z87891 Personal history of nicotine dependence: Secondary | ICD-10-CM | POA: Insufficient documentation

## 2022-09-26 DIAGNOSIS — R5383 Other fatigue: Secondary | ICD-10-CM | POA: Insufficient documentation

## 2022-09-26 DIAGNOSIS — G40909 Epilepsy, unspecified, not intractable, without status epilepticus: Secondary | ICD-10-CM | POA: Diagnosis not present

## 2022-09-26 DIAGNOSIS — Z5112 Encounter for antineoplastic immunotherapy: Secondary | ICD-10-CM | POA: Insufficient documentation

## 2022-09-26 DIAGNOSIS — Z9221 Personal history of antineoplastic chemotherapy: Secondary | ICD-10-CM | POA: Insufficient documentation

## 2022-09-26 DIAGNOSIS — C714 Malignant neoplasm of occipital lobe: Secondary | ICD-10-CM | POA: Insufficient documentation

## 2022-09-26 DIAGNOSIS — Z510 Encounter for antineoplastic radiation therapy: Secondary | ICD-10-CM | POA: Diagnosis not present

## 2022-09-26 DIAGNOSIS — Z923 Personal history of irradiation: Secondary | ICD-10-CM | POA: Insufficient documentation

## 2022-09-26 DIAGNOSIS — Z79899 Other long term (current) drug therapy: Secondary | ICD-10-CM | POA: Insufficient documentation

## 2022-09-26 LAB — RAD ONC ARIA SESSION SUMMARY
Course Elapsed Days: 29
Plan Fractions Treated to Date: 21
Plan Prescribed Dose Per Fraction: 1.8 Gy
Plan Total Fractions Prescribed: 29
Plan Total Prescribed Dose: 52.2 Gy
Reference Point Dosage Given to Date: 37.8 Gy
Reference Point Session Dosage Given: 1.8 Gy
Session Number: 21

## 2022-09-26 LAB — CBC WITH DIFFERENTIAL (CANCER CENTER ONLY)
Abs Immature Granulocytes: 0.01 10*3/uL (ref 0.00–0.07)
Basophils Absolute: 0 10*3/uL (ref 0.0–0.1)
Basophils Relative: 1 %
Eosinophils Absolute: 0.1 10*3/uL (ref 0.0–0.5)
Eosinophils Relative: 2 %
HCT: 43.1 % (ref 39.0–52.0)
Hemoglobin: 16 g/dL (ref 13.0–17.0)
Immature Granulocytes: 0 %
Lymphocytes Relative: 16 %
Lymphs Abs: 0.5 10*3/uL — ABNORMAL LOW (ref 0.7–4.0)
MCH: 36.7 pg — ABNORMAL HIGH (ref 26.0–34.0)
MCHC: 37.1 g/dL — ABNORMAL HIGH (ref 30.0–36.0)
MCV: 98.9 fL (ref 80.0–100.0)
Monocytes Absolute: 0.2 10*3/uL (ref 0.1–1.0)
Monocytes Relative: 6 %
Neutro Abs: 2.3 10*3/uL (ref 1.7–7.7)
Neutrophils Relative %: 75 %
Platelet Count: 119 10*3/uL — ABNORMAL LOW (ref 150–400)
RBC: 4.36 MIL/uL (ref 4.22–5.81)
RDW: 11.6 % (ref 11.5–15.5)
WBC Count: 3 10*3/uL — ABNORMAL LOW (ref 4.0–10.5)
nRBC: 0 % (ref 0.0–0.2)

## 2022-09-26 LAB — TOTAL PROTEIN, URINE DIPSTICK: Protein, ur: NEGATIVE mg/dL

## 2022-09-26 MED ORDER — SODIUM CHLORIDE 0.9 % IV SOLN: Freq: Once | INTRAVENOUS | Status: AC

## 2022-09-26 MED ORDER — SODIUM CHLORIDE 0.9 % IV SOLN
10.0000 mg/kg | Freq: Once | INTRAVENOUS | Status: AC
  Administered 2022-09-26: 800 mg via INTRAVENOUS
  Filled 2022-09-26: qty 32

## 2022-09-26 MED ORDER — CLONAZEPAM 0.5 MG PO TABS: 0.5000 mg | ORAL_TABLET | Freq: Two times a day (BID) | ORAL | 0 refills | Status: DC | PRN

## 2022-09-26 NOTE — Progress Notes (Signed)
[facility] [address]  [place], [place] [address] [phone]   Interval Evaluation  Date of Service: 09/26/22 Patient Name: Robert Watkins Patient MRN: [id number] Patient DOB: 1985-09-26 Provider: Henreitta Leber, MD  Identifying Statement:  Robert Watkins is a 37 y.o. 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 Barnett Abu in [place]. 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 Lavonna Monarch. Pathology confirms anaplastic astrocytoma.   06/01/2010 Clinical Event-Other  New patient evaluation at [facility]. Recommend radiation therapy with concurrent Temozolomide followed by twelve cycles of five-day Temozolomide.   06/13/2010 - 08/01/2010 Radiation  Radiation with Temodar.  06/13/2010 - 08/01/2010 Chemotherapy  Temodar 75 mg/m2 with radiation.  08/16/2010 - 08/07/2011 Chemotherapy  5 day Temodar 200 mg/m2  02/19/2018 Progression  Progression of non-enhancing tumor compared to 2013. Recommend initiating metronomic daily Temodar 50 mg/m2/day.  02/12/20 Progression Initiates Tibsovo 500mg  daily  10/21/20 Progression Adds oral CCNU 90mg /m2 q6 weeks to daily Tibsovo  08/06/22 Progression Repeat IMRT with concurrent Avastin 10mg /kg IV q2 weeks  Interval History: Robert Watkins presents today for follow up, now having completed 4 weeks of IMRT and avastin.  Tolerating treatment well overall. No clinical changes today.  Burden of fatigue is stable.  No headaches or seizures.  He remains active and functionally independent.  Anxiety continues to be a problem, no counselor yet.  Medications: Current Outpatient Medications on File Prior  to Visit  Medication Sig Dispense Refill   ivosidenib (TIBSOVO) 250 MG tablet Take 2 tablets (500 mg total) by mouth daily. (Patient not taking: Reported on 09/12/2022) 60 tablet 0   lamoTRIgine (LAMICTAL) 100 MG tablet Take 1 tablet (100 mg total) by mouth daily. 60 tablet 1   No current facility-administered medications on file prior to visit.    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 Recommend radiation therapy with concurrent Temozolomide followed by twelve cycles of five-day Temozolomide. ?   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 Lavonna Monarch. Pathology confirms anaplastic astrocytoma. ?   CRANIOTOMY FOR STEREOTACTIC GUIDED SURGERY Left 05/12/2010   Stereotactic biopsy of left occipital lesion by Dr. Barnett Abu in [place]. Pathology demonstrates anaplastic astrocytoma (WHO III)?   Social History:  Social History   Socioeconomic History   Marital status: Single    Spouse name: Not on file   Number of children: Not on file   Years of education: Not on file   Highest education level: Not on file  Occupational History   Not on file  Tobacco Use   Smoking status: Former   Smokeless tobacco: Never  Substance and Sexual Activity   Alcohol use: Yes  Alcohol/week: 4.0 standard drinks of alcohol    Types: 4 Shots of liquor per week   Drug use: Yes    Types:  Marijuana   Sexual activity: Yes  Other Topics Concern   Not on file  Social History Narrative   Not on file   Social Determinants of Health   Financial Resource Strain: Not on file  Food Insecurity: No Food Insecurity (08/21/2022)   Hunger Vital Sign    Worried About Running Out of Food in the Last Year: Never true    Ran Out of Food in the Last Year: Never true  Transportation Needs: No Transportation Needs (08/21/2022)   PRAPARE - Administrator, Civil Service (Medical): No    Lack of Transportation (Non-Medical): No  Physical Activity: Not on file  Stress: Not on file  Social Connections: Not on file  Intimate Partner Violence: Not At Risk (08/21/2022)   Humiliation, Afraid, Rape, and Kick questionnaire    Fear of Current or Ex-Partner: No    Emotionally Abused: No    Physically Abused: No    Sexually Abused: No   Family History: No family history on file.  Review of Systems: Constitutional: Denies fevers, chills or abnormal weight loss Eyes: Denies blurriness of vision Ears, nose, mouth, throat, and face: Denies mucositis or sore throat Respiratory: Denies cough, dyspnea or wheezes Cardiovascular: Denies palpitation, chest discomfort or lower extremity swelling Gastrointestinal:  Denies nausea, constipation, diarrhea GU: Denies dysuria or incontinence Skin: Denies abnormal skin rashes Neurological: Per HPI Musculoskeletal: Denies joint pain, back or neck discomfort. No decrease in ROM Behavioral/Psych: Denies anxiety, disturbance in thought content, and mood instability  Physical Exam: Vitals:   09/26/22 1341  BP: 116/68  Pulse: (!) 53  Resp: 18  Temp: 97.8 F (36.6 C)  SpO2: 100%   KPS: 90. General: Alert, cooperative, pleasant, in no acute distress Head: Normal EENT: No conjunctival injection or scleral icterus. Oral mucosa moist Lungs: Resp effort normal Cardiac: Regular rate and rhythm Abdomen: Soft, non-distended abdomen Skin: No rashes  cyanosis or petechiae. Extremities: No clubbing or edema  Neurologic Exam: Mental Status: Awake, alert, attentive to examiner. Oriented to self and environment. Language is fluent with intact comprehension.  Cranial Nerves: Visual acuity is grossly normal. Visual fields are full. Extra-ocular movements intact. No ptosis. Face is symmetric, tongue midline. Motor: Tone and bulk are normal. Power is full in both arms and legs. Reflexes are symmetric, no pathologic reflexes present. Intact finger to nose bilaterally Sensory: Intact to light touch and temperature Gait: Normal and tandem gait is normal.   Labs: I have reviewed the data as listed    Component Value Date/Time   NA 139 09/12/2022 1341   K 4.4 09/12/2022 1341   CL 105 09/12/2022 1341   CO2 29 09/12/2022 1341   GLUCOSE 121 (H) 09/12/2022 1341   BUN 20 09/12/2022 1341   CREATININE 1.19 09/12/2022 1341   CALCIUM 9.5 09/12/2022 1341   PROT 6.7 09/12/2022 1341   ALBUMIN 4.6 09/12/2022 1341   AST 24 09/12/2022 1341   ALT 36 09/12/2022 1341   ALKPHOS 64 09/12/2022 1341   BILITOT 1.0 09/12/2022 1341   GFRNONAA >60 09/12/2022 1341   GFRAA >60 08/19/2018 0854   Lab Results  Component Value Date   WBC 3.0 (L) 09/26/2022   NEUTROABS 2.3 09/26/2022   HGB 16.0 09/26/2022   HCT 43.1 09/26/2022   MCV 98.9 09/26/2022   PLT 119 (L) 09/26/2022  Assessment/Plan 1. Anaplastic astrocytoma [facility])  Mr. Campbel is clinically stable today.  He has completed 4 weeks of repeat IMRT.  Avastin will be administered given high risk of radio-inflammatory syndrome, second infusion today.  Tibsovo will continue to be held during radiation.  We ultimately recommended continuing with course of intensity modulated radiation therapy and concurrent IV avastin 10mg /kg q2 weeks.  Radiation will be administered Mon-Fri over 6 weeks.  We reviewed side effects of avastin, including hypertension, bleeding/clotting events, wound healing impairment, and  cytopenias.  Avastin should be held for the following:  ANC less than 500  Platelets less than 50,000  LFT or creatinine greater than 2x ULN  If clinical concerns/contraindications develop  Every 2 weeks during radiation, labs will be checked accompanied by a clinical evaluation in the brain tumor clinic on infusion days.  Will con't Lamictal 100mg  daily.  Ok with Klonopin 0.5mg  BID for anxiety.  We will have LCSW reach out to him for counseling as well.  We appreciate the opportunity to participate in the care of Robert Watkins.    All questions were answered. The patient knows to call the clinic with any problems, questions or concerns. No barriers to learning were detected.  I have spent a total of 30 minutes of face-to-face and non-face-to-face time, excluding clinical staff time, preparing to see patient, ordering tests and/or medications, counseling the patient, and independently interpreting results and communicating results to the patient/family/caregiver    Henreitta Leber, MD Medical Director of Neuro-Oncology [facility] 09/26/22 1:53 PM

## 2022-09-26 NOTE — Patient Instructions (Signed)
[facility]  Discharge Instructions: Thank you for choosing [facility] to provide your oncology and hematology care.   If you have a lab appointment with the Cancer Center, please go directly to the Cancer Center and check in at the registration area.   Wear comfortable clothing and clothing appropriate for easy access to any Portacath or PICC line.   We strive to give you quality time with your provider. You may need to reschedule your appointment if you arrive late (15 or more minutes).  Arriving late affects you and other patients whose appointments are after yours.  Also, if you miss three or more appointments without notifying the office, you may be dismissed from the clinic at the provider's discretion.      For prescription refill requests, have your pharmacy contact our office and allow 72 hours for refills to be completed.    Today you received the following chemotherapy and/or immunotherapy agent: Bevacizumab (MVASI)   To help prevent nausea and vomiting after your treatment, we encourage you to take your nausea medication as directed.  BELOW ARE SYMPTOMS THAT SHOULD BE REPORTED IMMEDIATELY: *FEVER GREATER THAN 100.4 F (38 C) OR HIGHER *CHILLS OR SWEATING *NAUSEA AND VOMITING THAT IS NOT CONTROLLED WITH YOUR NAUSEA MEDICATION *UNUSUAL SHORTNESS OF BREATH *UNUSUAL BRUISING OR BLEEDING *URINARY PROBLEMS (pain or burning when urinating, or frequent urination) *BOWEL PROBLEMS (unusual diarrhea, constipation, pain near the anus) TENDERNESS IN MOUTH AND THROAT WITH OR WITHOUT PRESENCE OF ULCERS (sore throat, sores in mouth, or a toothache) UNUSUAL RASH, SWELLING OR PAIN  UNUSUAL VAGINAL DISCHARGE OR ITCHING   Items with * indicate a potential emergency and should be followed up as soon as possible or go to the Emergency Department if any problems should occur.  Please show the CHEMOTHERAPY ALERT CARD or IMMUNOTHERAPY ALERT CARD at  check-in to the Emergency Department and triage nurse.  Should you have questions after your visit or need to cancel or reschedule your appointment, please contact [facility]  Dept: [phone]  and follow the prompts.  Office hours are 8:00 a.m. to 4:30 p.m. Monday - Friday. Please note that voicemails left after 4:00 p.m. may not be returned until the following business day.  We are closed weekends and major holidays. You have access to a nurse at all times for urgent questions. Please call the main number to the clinic Dept: [phone] and follow the prompts.   For any non-urgent questions, you may also contact your provider using MyChart. We now offer e-Visits for anyone 18 and older to request care online for non-urgent symptoms. For details visit [email or web address].   Also download the MyChart app! Go to the app store, search "MyChart", open the app, select [place], and log in with your MyChart username and password.  Bevacizumab Injection What is this medication? BEVACIZUMAB (be va SIZ yoo mab) treats some types of cancer. It works by blocking a protein that causes cancer cells to grow and multiply. This helps to slow or stop the spread of cancer cells. It is a monoclonal antibody. This medicine may be used for other purposes; ask your health care provider or pharmacist if you have questions. COMMON BRAND NAME(S): Alymsys, Avastin, MVASI, Zirabev What should I tell my care team before I take this medication? They need to know if you have any of these conditions: Blood clots Coughing up blood Having or recent surgery Heart failure High   blood pressure History of a connection between 2 or more body parts that do not usually connect (fistula) History of a tear in your stomach or intestines Protein in your urine An unusual or allergic reaction to bevacizumab, other medications, foods, dyes, or preservatives Pregnant or trying to get  pregnant Breast-feeding How should I use this medication? This medication is injected into a vein. It is given by your care team in a hospital or clinic setting. Talk to your care team the use of this medication in children. Special care may be needed. Overdosage: If you think you have taken too much of this medicine contact a poison control center or emergency room at once. NOTE: This medicine is only for you. Do not share this medicine with others. What if I miss a dose? Keep appointments for follow-up doses. It is important not to miss your dose. Call your care team if you are unable to keep an appointment. What may interact with this medication? Interactions are not expected. This list may not describe all possible interactions. Give your health care provider a list of all the medicines, herbs, non-prescription drugs, or dietary supplements you use. Also tell them if you smoke, drink alcohol, or use illegal drugs. Some items may interact with your medicine. What should I watch for while using this medication? Your condition will be monitored carefully while you are receiving this medication. You may need blood work while taking this medication. This medication may make you feel generally unwell. This is not uncommon as chemotherapy can affect healthy cells as well as cancer cells. Report any side effects. Continue your course of treatment even though you feel ill unless your care team tells you to stop. This medication may increase your risk to bruise or bleed. Call your care team if you notice any unusual bleeding. Before having surgery, talk to your care team to make sure it is ok. This medication can increase the risk of poor healing of your surgical site or wound. You will need to stop this medication for 28 days before surgery. After surgery, wait at least 28 days before restarting this medication. Make sure the surgical site or wound is healed enough before restarting this medication. Talk  to your care team if questions. Talk to your care team if you may be pregnant. Serious birth defects can occur if you take this medication during pregnancy and for 6 months after the last dose. Contraception is recommended while taking this medication and for 6 months after the last dose. Your care team can help you find the option that works for you. Do not breastfeed while taking this medication and for 6 months after the last dose. This medication can cause infertility. Talk to your care team if you are concerned about your fertility. What side effects may I notice from receiving this medication? Side effects that you should report to your care team as soon as possible: Allergic reactions--skin rash, itching, hives, swelling of the face, lips, tongue, or throat Bleeding--bloody or black, tar-like stools, vomiting blood or brown material that looks like coffee grounds, red or dark brown urine, small red or purple spots on skin, unusual bruising or bleeding Blood clot--pain, swelling, or warmth in the leg, shortness of breath, chest pain Heart attack--pain or tightness in the chest, shoulders, arms, or jaw, nausea, shortness of breath, cold or clammy skin, feeling faint or lightheaded Heart failure--shortness of breath, swelling of the ankles, feet, or hands, sudden weight gain, unusual weakness or fatigue Increase   in blood pressure Infection--fever, chills, cough, sore throat, wounds that don't heal, pain or trouble when passing urine, general feeling of discomfort or being unwell Infusion reactions--chest pain, shortness of breath or trouble breathing, feeling faint or lightheaded Kidney injury--decrease in the amount of urine, swelling of the ankles, hands, or feet Stomach pain that is severe, does not go away, or gets worse Stroke--sudden numbness or weakness of the face, arm, or leg, trouble speaking, confusion, trouble walking, loss of balance or coordination, dizziness, severe headache,  change in vision Sudden and severe headache, confusion, change in vision, seizures, which may be signs of posterior reversible encephalopathy syndrome (PRES) Side effects that usually do not require medical attention (report to your care team if they continue or are bothersome): Back pain Change in taste Diarrhea Dry skin Increased tears Nosebleed This list may not describe all possible side effects. Call your doctor for medical advice about side effects. You may report side effects to FDA at 1-800-FDA-1088. Where should I keep my medication? This medication is given in a hospital or clinic. It will not be stored at home. NOTE: This sheet is a summary. It may not cover all possible information. If you have questions about this medicine, talk to your doctor, pharmacist, or health care provider.  2023 Elsevier/Gold Standard (2021-08-12 00:00:00)    

## 2022-09-27 ENCOUNTER — Other Ambulatory Visit: Payer: Self-pay

## 2022-09-27 ENCOUNTER — Ambulatory Visit
Admission: RE | Admit: 2022-09-27 | Discharge: 2022-09-27 | Disposition: A | Discharge status: Home / Self Care | Payer: Managed Care, Other (non HMO) | Source: Ambulatory Visit | Attending: Radiation Oncology | Admitting: Radiation Oncology

## 2022-09-27 ENCOUNTER — Ambulatory Visit: Payer: Managed Care, Other (non HMO)

## 2022-09-27 ENCOUNTER — Telehealth: Payer: Self-pay

## 2022-09-27 DIAGNOSIS — Z510 Encounter for antineoplastic radiation therapy: Secondary | ICD-10-CM | POA: Diagnosis not present

## 2022-09-27 LAB — RAD ONC ARIA SESSION SUMMARY
Course Elapsed Days: 30
Plan Fractions Treated to Date: 22
Plan Prescribed Dose Per Fraction: 1.8 Gy
Plan Total Fractions Prescribed: 29
Plan Total Prescribed Dose: 52.2 Gy
Reference Point Dosage Given to Date: 39.6 Gy
Reference Point Session Dosage Given: 1.8 Gy
Session Number: 22

## 2022-09-27 NOTE — Telephone Encounter (Signed)
CSW attempted to contact patient per the request of Dr. Vaslow.  Left vm. 

## 2022-09-28 ENCOUNTER — Ambulatory Visit
Admission: RE | Admit: 2022-09-28 | Discharge: 2022-09-28 | Disposition: A | Discharge status: Home / Self Care | Payer: Managed Care, Other (non HMO) | Source: Ambulatory Visit | Attending: Radiation Oncology | Admitting: Radiation Oncology

## 2022-09-28 ENCOUNTER — Other Ambulatory Visit: Payer: Self-pay

## 2022-09-28 ENCOUNTER — Telehealth: Payer: Self-pay | Admitting: Internal Medicine

## 2022-09-28 DIAGNOSIS — Z510 Encounter for antineoplastic radiation therapy: Secondary | ICD-10-CM | POA: Diagnosis not present

## 2022-09-28 LAB — RAD ONC ARIA SESSION SUMMARY
Course Elapsed Days: 31
Plan Fractions Treated to Date: 23
Plan Prescribed Dose Per Fraction: 1.8 Gy
Plan Total Fractions Prescribed: 29
Plan Total Prescribed Dose: 52.2 Gy
Reference Point Dosage Given to Date: 41.4 Gy
Reference Point Session Dosage Given: 1.8 Gy
Session Number: 23

## 2022-09-28 NOTE — Telephone Encounter (Signed)
Scheduled per 06/04 los, patient has been called and notified. 

## 2022-09-29 ENCOUNTER — Ambulatory Visit
Admission: RE | Admit: 2022-09-29 | Discharge: 2022-09-29 | Disposition: A | Discharge status: Home / Self Care | Payer: Managed Care, Other (non HMO) | Source: Ambulatory Visit | Attending: Radiation Oncology | Admitting: Radiation Oncology

## 2022-09-29 ENCOUNTER — Other Ambulatory Visit: Payer: Self-pay

## 2022-09-29 DIAGNOSIS — Z510 Encounter for antineoplastic radiation therapy: Secondary | ICD-10-CM | POA: Diagnosis not present

## 2022-09-29 LAB — RAD ONC ARIA SESSION SUMMARY
Course Elapsed Days: 32
Plan Fractions Treated to Date: 24
Plan Prescribed Dose Per Fraction: 1.8 Gy
Plan Total Fractions Prescribed: 29
Plan Total Prescribed Dose: 52.2 Gy
Reference Point Dosage Given to Date: 43.2 Gy
Reference Point Session Dosage Given: 1.8 Gy
Session Number: 24

## 2022-10-02 ENCOUNTER — Ambulatory Visit
Admission: RE | Admit: 2022-10-02 | Discharge: 2022-10-02 | Disposition: A | Discharge status: Home / Self Care | Payer: Managed Care, Other (non HMO) | Source: Ambulatory Visit | Attending: Radiation Oncology | Admitting: Radiation Oncology

## 2022-10-02 ENCOUNTER — Encounter: Payer: Self-pay | Admitting: General Practice

## 2022-10-02 ENCOUNTER — Other Ambulatory Visit: Payer: Self-pay

## 2022-10-02 DIAGNOSIS — Z510 Encounter for antineoplastic radiation therapy: Secondary | ICD-10-CM | POA: Diagnosis not present

## 2022-10-02 LAB — RAD ONC ARIA SESSION SUMMARY
Course Elapsed Days: 35
Plan Fractions Treated to Date: 25
Plan Prescribed Dose Per Fraction: 1.8 Gy
Plan Total Fractions Prescribed: 29
Plan Total Prescribed Dose: 52.2 Gy
Reference Point Dosage Given to Date: 45 Gy
Reference Point Session Dosage Given: 1.8 Gy
Session Number: 25

## 2022-10-02 NOTE — Progress Notes (Signed)
[facility] Spiritual Care Note  Attempted follow-up by phone, leaving voicemail with direct number and encouragement to return call in order to schedule follow-up as desired.   [address] [place], [place], [facility] Pager [phone] Voicemail [phone]

## 2022-10-03 ENCOUNTER — Other Ambulatory Visit: Payer: Self-pay

## 2022-10-03 ENCOUNTER — Ambulatory Visit
Admission: RE | Admit: 2022-10-03 | Discharge: 2022-10-03 | Disposition: A | Discharge status: Home / Self Care | Payer: Managed Care, Other (non HMO) | Source: Ambulatory Visit | Attending: Radiation Oncology | Admitting: Radiation Oncology

## 2022-10-03 DIAGNOSIS — Z510 Encounter for antineoplastic radiation therapy: Secondary | ICD-10-CM | POA: Diagnosis not present

## 2022-10-03 LAB — RAD ONC ARIA SESSION SUMMARY
Course Elapsed Days: 36
Plan Fractions Treated to Date: 26
Plan Prescribed Dose Per Fraction: 1.8 Gy
Plan Total Fractions Prescribed: 29
Plan Total Prescribed Dose: 52.2 Gy
Reference Point Dosage Given to Date: 46.8 Gy
Reference Point Session Dosage Given: 1.8 Gy
Session Number: 26

## 2022-10-04 ENCOUNTER — Other Ambulatory Visit: Payer: Self-pay

## 2022-10-04 ENCOUNTER — Ambulatory Visit
Admission: RE | Admit: 2022-10-04 | Discharge: 2022-10-04 | Disposition: A | Discharge status: Home / Self Care | Payer: Managed Care, Other (non HMO) | Source: Ambulatory Visit | Attending: Radiation Oncology | Admitting: Radiation Oncology

## 2022-10-04 DIAGNOSIS — Z510 Encounter for antineoplastic radiation therapy: Secondary | ICD-10-CM | POA: Diagnosis not present

## 2022-10-04 LAB — RAD ONC ARIA SESSION SUMMARY
Course Elapsed Days: 37
Plan Fractions Treated to Date: 27
Plan Prescribed Dose Per Fraction: 1.8 Gy
Plan Total Fractions Prescribed: 29
Plan Total Prescribed Dose: 52.2 Gy
Reference Point Dosage Given to Date: 48.6 Gy
Reference Point Session Dosage Given: 1.8 Gy
Session Number: 27

## 2022-10-05 ENCOUNTER — Ambulatory Visit
Admission: RE | Admit: 2022-10-05 | Discharge: 2022-10-05 | Disposition: A | Discharge status: Home / Self Care | Payer: Managed Care, Other (non HMO) | Source: Ambulatory Visit | Attending: Radiation Oncology | Admitting: Radiation Oncology

## 2022-10-05 ENCOUNTER — Other Ambulatory Visit: Payer: Self-pay

## 2022-10-05 DIAGNOSIS — Z510 Encounter for antineoplastic radiation therapy: Secondary | ICD-10-CM | POA: Diagnosis not present

## 2022-10-05 LAB — RAD ONC ARIA SESSION SUMMARY
Course Elapsed Days: 38
Plan Fractions Treated to Date: 28
Plan Prescribed Dose Per Fraction: 1.8 Gy
Plan Total Fractions Prescribed: 29
Plan Total Prescribed Dose: 52.2 Gy
Reference Point Dosage Given to Date: 50.4 Gy
Reference Point Session Dosage Given: 1.8 Gy
Session Number: 28

## 2022-10-06 ENCOUNTER — Ambulatory Visit
Admission: RE | Admit: 2022-10-06 | Discharge: 2022-10-06 | Disposition: A | Discharge status: Home / Self Care | Payer: Managed Care, Other (non HMO) | Source: Ambulatory Visit | Attending: Radiation Oncology | Admitting: Radiation Oncology

## 2022-10-06 ENCOUNTER — Telehealth: Payer: Self-pay | Admitting: *Deleted

## 2022-10-06 ENCOUNTER — Other Ambulatory Visit: Payer: Self-pay

## 2022-10-06 DIAGNOSIS — Z510 Encounter for antineoplastic radiation therapy: Secondary | ICD-10-CM | POA: Diagnosis not present

## 2022-10-06 LAB — RAD ONC ARIA SESSION SUMMARY
Course Elapsed Days: 39
Plan Fractions Treated to Date: 29
Plan Prescribed Dose Per Fraction: 1.8 Gy
Plan Total Fractions Prescribed: 29
Plan Total Prescribed Dose: 52.2 Gy
Reference Point Dosage Given to Date: 52.2 Gy
Reference Point Session Dosage Given: 1.8 Gy
Session Number: 29

## 2022-10-06 NOTE — Telephone Encounter (Signed)
Received call from [employer] asking for a refill for Tibsovo.  Advised that Tibsovo is currently on hold while the patient is receiving radiation.

## 2022-10-09 ENCOUNTER — Ambulatory Visit: Payer: Managed Care, Other (non HMO)

## 2022-10-10 ENCOUNTER — Ambulatory Visit: Payer: Managed Care, Other (non HMO)

## 2022-10-10 ENCOUNTER — Inpatient Hospital Stay (HOSPITAL_BASED_OUTPATIENT_CLINIC_OR_DEPARTMENT_OTHER): Payer: Managed Care, Other (non HMO) | Admitting: Internal Medicine

## 2022-10-10 ENCOUNTER — Inpatient Hospital Stay: Payer: Managed Care, Other (non HMO)

## 2022-10-10 VITALS — BP 110/71 | HR 42 | Temp 98.1°F | Resp 20 | Wt 169.0 lb

## 2022-10-10 VITALS — BP 112/72 | HR 46

## 2022-10-10 DIAGNOSIS — C719 Malignant neoplasm of brain, unspecified: Secondary | ICD-10-CM

## 2022-10-10 DIAGNOSIS — G40909 Epilepsy, unspecified, not intractable, without status epilepticus: Secondary | ICD-10-CM

## 2022-10-10 DIAGNOSIS — Z510 Encounter for antineoplastic radiation therapy: Secondary | ICD-10-CM | POA: Diagnosis not present

## 2022-10-10 LAB — CBC WITH DIFFERENTIAL (CANCER CENTER ONLY)
Abs Immature Granulocytes: 0.01 10*3/uL (ref 0.00–0.07)
Basophils Absolute: 0 10*3/uL (ref 0.0–0.1)
Basophils Relative: 0 %
Eosinophils Absolute: 0.1 10*3/uL (ref 0.0–0.5)
Eosinophils Relative: 2 %
HCT: 41.3 % (ref 39.0–52.0)
Hemoglobin: 15.2 g/dL (ref 13.0–17.0)
Immature Granulocytes: 0 %
Lymphocytes Relative: 12 %
Lymphs Abs: 0.3 10*3/uL — ABNORMAL LOW (ref 0.7–4.0)
MCH: 36.8 pg — ABNORMAL HIGH (ref 26.0–34.0)
MCHC: 36.8 g/dL — ABNORMAL HIGH (ref 30.0–36.0)
MCV: 100 fL (ref 80.0–100.0)
Monocytes Absolute: 0.2 10*3/uL (ref 0.1–1.0)
Monocytes Relative: 6 %
Neutro Abs: 2.2 10*3/uL (ref 1.7–7.7)
Neutrophils Relative %: 80 %
Platelet Count: 89 10*3/uL — ABNORMAL LOW (ref 150–400)
RBC: 4.13 MIL/uL — ABNORMAL LOW (ref 4.22–5.81)
RDW: 11.5 % (ref 11.5–15.5)
WBC Count: 2.8 10*3/uL — ABNORMAL LOW (ref 4.0–10.5)
nRBC: 0 % (ref 0.0–0.2)

## 2022-10-10 MED ORDER — SODIUM CHLORIDE 0.9 % IV SOLN: Freq: Once | INTRAVENOUS | Status: AC

## 2022-10-10 MED ORDER — SODIUM CHLORIDE 0.9 % IV SOLN
10.0000 mg/kg | Freq: Once | INTRAVENOUS | Status: AC
  Administered 2022-10-10: 800 mg via INTRAVENOUS
  Filled 2022-10-10: qty 32

## 2022-10-10 NOTE — Progress Notes (Signed)
Patient seen by Dr. Driscilla Grammes are within treatment parameters.  Labs reviewed: are not all within treatment parameters,  platelets 89  Per physician team, patient is ready for treatment and there are NO modifications to the treatment plan.

## 2022-10-10 NOTE — Patient Instructions (Signed)
[facility]  Discharge Instructions: Thank you for choosing [facility] to provide your oncology and hematology care.   If you have a lab appointment with the Cancer Center, please go directly to the Cancer Center and check in at the registration area.   Wear comfortable clothing and clothing appropriate for easy access to any Portacath or PICC line.   We strive to give you quality time with your provider. You may need to reschedule your appointment if you arrive late (15 or more minutes).  Arriving late affects you and other patients whose appointments are after yours.  Also, if you miss three or more appointments without notifying the office, you may be dismissed from the clinic at the provider's discretion.      For prescription refill requests, have your pharmacy contact our office and allow 72 hours for refills to be completed.    Today you received the following chemotherapy and/or immunotherapy agents: Bevacizumab      To help prevent nausea and vomiting after your treatment, we encourage you to take your nausea medication as directed.  BELOW ARE SYMPTOMS THAT SHOULD BE REPORTED IMMEDIATELY: *FEVER GREATER THAN 100.4 F (38 C) OR HIGHER *CHILLS OR SWEATING *NAUSEA AND VOMITING THAT IS NOT CONTROLLED WITH YOUR NAUSEA MEDICATION *UNUSUAL SHORTNESS OF BREATH *UNUSUAL BRUISING OR BLEEDING *URINARY PROBLEMS (pain or burning when urinating, or frequent urination) *BOWEL PROBLEMS (unusual diarrhea, constipation, pain near the anus) TENDERNESS IN MOUTH AND THROAT WITH OR WITHOUT PRESENCE OF ULCERS (sore throat, sores in mouth, or a toothache) UNUSUAL RASH, SWELLING OR PAIN  UNUSUAL VAGINAL DISCHARGE OR ITCHING   Items with * indicate a potential emergency and should be followed up as soon as possible or go to the Emergency Department if any problems should occur.  Please show the CHEMOTHERAPY ALERT CARD or IMMUNOTHERAPY ALERT CARD at  check-in to the Emergency Department and triage nurse.  Should you have questions after your visit or need to cancel or reschedule your appointment, please contact [facility]  Dept: [phone]  and follow the prompts.  Office hours are 8:00 a.m. to 4:30 p.m. Monday - Friday. Please note that voicemails left after 4:00 p.m. may not be returned until the following business day.  We are closed weekends and major holidays. You have access to a nurse at all times for urgent questions. Please call the main number to the clinic Dept: [phone] and follow the prompts.   For any non-urgent questions, you may also contact your provider using MyChart. We now offer e-Visits for anyone 18 and older to request care online for non-urgent symptoms. For details visit [email or web address].   Also download the MyChart app! Go to the app store, search "MyChart", open the app, select [facility] [facility], and log in with your MyChart username and password.   

## 2022-10-10 NOTE — Progress Notes (Signed)
[facility] at [facility] [address]  [place], [place] [address] [phone]   Interval Evaluation  Date of Service: 10/10/22 Patient Name: Robert Watkins Patient MRN: [id number] Patient DOB: 06-29-1985 Provider: Henreitta Leber, MD  Identifying Statement:  Robert Watkins is a 37 y.o. 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 Barnett Abu in [place]. 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 Lavonna Monarch. Pathology confirms anaplastic astrocytoma.   06/01/2010 Clinical Event-Other  New patient evaluation at [facility]. Recommend radiation therapy with concurrent Temozolomide followed by twelve cycles of five-day Temozolomide.   06/13/2010 - 08/01/2010 Radiation  Radiation with Temodar.  06/13/2010 - 08/01/2010 Chemotherapy  Temodar 75 mg/m2 with radiation.  08/16/2010 - 08/07/2011 Chemotherapy  5 day Temodar 200 mg/m2  02/19/2018 Progression  Progression of non-enhancing tumor compared to 2013. Recommend initiating metronomic daily Temodar 50 mg/m2/day.  02/12/20 Progression Initiates Tibsovo 500mg  daily  10/21/20 Progression Adds oral CCNU 90mg /m2 q6 weeks to daily Tibsovo  08/06/22 Progression Repeat IMRT with concurrent Avastin 10mg /kg IV q2 weeks  Interval History: Robert Watkins presents today for follow up, now having completed full 6 weeks of IMRT and avastin.  Tolerated treatment well overall. No clinical changes today.  Burden of fatigue is stable.  No headaches or seizures.  He remains active and functionally independent.  Anxiety continues to be a problem.  Medications: Current Outpatient Medications on File Prior to Visit   Medication Sig Dispense Refill   clonazePAM (KLONOPIN) 0.5 MG tablet Take 1 tablet (0.5 mg total) by mouth 2 (two) times daily as needed for anxiety. 60 tablet 0   ivosidenib (TIBSOVO) 250 MG tablet Take 2 tablets (500 mg total) by mouth daily. (Patient not taking: Reported on 09/12/2022) 60 tablet 0   lamoTRIgine (LAMICTAL) 100 MG tablet Take 1 tablet (100 mg total) by mouth daily. 60 tablet 1   No current facility-administered medications on file prior to visit.    Allergies:  No Active Allergies  Past Medical History:  Past Medical History:  Diagnosis Date   Cancer of brain (HCC) 04/07/2011   Depression, reactive 04/07/2011   History of chemotherapy 06/13/2010-08/01/2010   42 day Temodar with concurrent Radiation   History of chemotherapy 08/16/2010-08/07/2011   5 day Temodar 200 mg/m2   History of radiation therapy 06/13/2010-08/01/2010   [facility]. Recommend radiation therapy with concurrent Temozolomide followed by twelve cycles of five-day Temozolomide. ?   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 Lavonna Monarch. Pathology confirms anaplastic astrocytoma. ?   CRANIOTOMY FOR STEREOTACTIC GUIDED SURGERY Left 05/12/2010   Stereotactic biopsy of left occipital lesion by Dr. Barnett Abu in [place]. Pathology demonstrates anaplastic astrocytoma (WHO III)?   Social History:  Social History   Socioeconomic History   Marital status: Single    Spouse name: Not on file   Number of children: Not on file   Years of education: Not on file   Highest education level: Not on file  Occupational History   Not on  file  Tobacco Use   Smoking status: Former   Smokeless tobacco: Never  Substance and Sexual Activity   Alcohol use:  Yes    Alcohol/week: 4.0 standard drinks of alcohol    Types: 4 Shots of liquor per week   Drug use: Yes    Types: Marijuana   Sexual activity: Yes  Other Topics Concern   Not on file  Social History Narrative   Not on file   Social Determinants of Health   Financial Resource Strain: Not on file  Food Insecurity: No Food Insecurity (08/21/2022)   Hunger Vital Sign    Worried About Running Out of Food in the Last Year: Never true    Ran Out of Food in the Last Year: Never true  Transportation Needs: No Transportation Needs (08/21/2022)   PRAPARE - Administrator, Civil Service (Medical): No    Lack of Transportation (Non-Medical): No  Physical Activity: Not on file  Stress: Not on file  Social Connections: Not on file  Intimate Partner Violence: Not At Risk (08/21/2022)   Humiliation, Afraid, Rape, and Kick questionnaire    Fear of Current or Ex-Partner: No    Emotionally Abused: No    Physically Abused: No    Sexually Abused: No   Family History: No family history on file.  Review of Systems: Constitutional: Denies fevers, chills or abnormal weight loss Eyes: Denies blurriness of vision Ears, nose, mouth, throat, and face: Denies mucositis or sore throat Respiratory: Denies cough, dyspnea or wheezes Cardiovascular: Denies palpitation, chest discomfort or lower extremity swelling Gastrointestinal:  Denies nausea, constipation, diarrhea GU: Denies dysuria or incontinence Skin: Denies abnormal skin rashes Neurological: Per HPI Musculoskeletal: Denies joint pain, back or neck discomfort. No decrease in ROM Behavioral/Psych: Denies anxiety, disturbance in thought content, and mood instability  Physical Exam: Vitals:   10/10/22 1340  BP: 110/71  Pulse: (!) 42  Resp: 20  Temp: 98.1 F (36.7 C)  SpO2: 100%   KPS: 90. General: Alert, cooperative, pleasant, in no acute distress Head: Normal EENT: No conjunctival injection or scleral icterus. Oral mucosa  moist Lungs: Resp effort normal Cardiac: Regular rate and rhythm Abdomen: Soft, non-distended abdomen Skin: No rashes cyanosis or petechiae. Extremities: No clubbing or edema  Neurologic Exam: Mental Status: Awake, alert, attentive to examiner. Oriented to self and environment. Language is fluent with intact comprehension.  Cranial Nerves: Visual acuity is grossly normal. Visual fields are full. Extra-ocular movements intact. No ptosis. Face is symmetric, tongue midline. Motor: Tone and bulk are normal. Power is full in both arms and legs. Reflexes are symmetric, no pathologic reflexes present. Intact finger to nose bilaterally Sensory: Intact to light touch and temperature Gait: Normal and tandem gait is normal.   Labs: I have reviewed the data as listed    Component Value Date/Time   NA 139 09/12/2022 1341   K 4.4 09/12/2022 1341   CL 105 09/12/2022 1341   CO2 29 09/12/2022 1341   GLUCOSE 121 (H) 09/12/2022 1341   BUN 20 09/12/2022 1341   CREATININE 1.19 09/12/2022 1341   CALCIUM 9.5 09/12/2022 1341   PROT 6.7 09/12/2022 1341   ALBUMIN 4.6 09/12/2022 1341   AST 24 09/12/2022 1341   ALT 36 09/12/2022 1341   ALKPHOS 64 09/12/2022 1341   BILITOT 1.0 09/12/2022 1341   GFRNONAA >60 09/12/2022 1341   GFRAA >60 08/19/2018 0854   Lab Results  Component Value Date   WBC 2.8 (L) 10/10/2022  NEUTROABS 2.2 10/10/2022   HGB 15.2 10/10/2022   HCT 41.3 10/10/2022   MCV 100.0 10/10/2022   PLT 89 (L) 10/10/2022    Assessment/Plan 1. Anaplastic astrocytoma [facility])  Mr. Drennon is clinically stable today.  He has completed full 6 weeks of repeat IMRT.    Avastin will be administered given high risk of radio-inflammatory syndrome, second infusion today.    We ultimately recommended continuing IV avastin 10mg /kg q2 weeks.    Avastin should be held for the following:  ANC less than 500  Platelets less than 50,000  LFT or creatinine greater than 2x ULN  If clinical  concerns/contraindications develop  Should hold off on resuming Tibsovo pending repeat labs in 2 weeks.  Will con't Lamictal 100mg  daily.  Ok with Klonopin 0.5mg  BID for anxiety.  We will have LCSW reach out to him for counseling as well.  We appreciate the opportunity to participate in the care of Esau Wilkin.    We ask that Philmore Loerch return to clinic in 2 weeks following post-RT brain MRI, or sooner as needed.  All questions were answered. The patient knows to call the clinic with any problems, questions or concerns. No barriers to learning were detected.  I have spent a total of 40 minutes of face-to-face and non-face-to-face time, excluding clinical staff time, preparing to see patient, ordering tests and/or medications, counseling the patient, and independently interpreting results and communicating results to the patient/family/caregiver    Henreitta Leber, MD Medical Director of Neuro-Oncology [facility] 10/10/22 2:01 PM

## 2022-10-11 ENCOUNTER — Ambulatory Visit: Payer: Managed Care, Other (non HMO)

## 2022-10-11 NOTE — Radiation Completion Notes (Signed)
Patient Name: Robert Watkins, Robert Watkins MRN: [id number] Date of Birth: Jan 26, 1986 Referring Physician: Elissa Hefty, M.D. Date of Service: 2022-10-11 Radiation Oncologist: Lonie Peak, M.D. [facility]                             RADIATION ONCOLOGY END OF TREATMENT NOTE     Diagnosis: C71.4 Malignant neoplasm of occipital lobe Intent: Palliative     ==========DELIVERED PLANS==========  First Treatment Date: 2022-08-28 - Last Treatment Date: 2022-10-06   Plan Name: Brain_L_Occip Site: Occipital Lobe Technique: IMRT Mode: Photon Dose Per Fraction: 1.8 Gy Prescribed Dose (Delivered / Prescribed): 52.2 Gy / 52.2 Gy Prescribed Fxs (Delivered / Prescribed): 29 / 29     ==========ON TREATMENT VISIT DATES========== 2022-08-28, 2022-09-04, 2022-09-11, 2022-09-19, 2022-09-27, 2022-10-06     ==========UPCOMING VISITS==========       ==========APPENDIX - ON TREATMENT VISIT NOTES==========   See weekly On Treatment Notes in Epic for details.

## 2022-10-12 ENCOUNTER — Ambulatory Visit: Payer: Managed Care, Other (non HMO)

## 2022-10-19 ENCOUNTER — Telehealth: Payer: Self-pay | Admitting: Internal Medicine

## 2022-10-20 ENCOUNTER — Telehealth: Payer: Self-pay | Admitting: Medical Oncology

## 2022-10-20 NOTE — Telephone Encounter (Signed)
STD- Has anyone from [employer] or [facility] requested information on him ?   He needs to push out his return to work for 2 weeks after his appt with Dr Sol Passer at [facility].

## 2022-10-23 NOTE — Telephone Encounter (Signed)
Message forwarded to provider for review.

## 2022-10-27 ENCOUNTER — Ambulatory Visit
Admission: RE | Admit: 2022-10-27 | Discharge: 2022-10-27 | Disposition: A | Discharge status: Home / Self Care | Payer: Managed Care, Other (non HMO) | Source: Ambulatory Visit | Attending: Internal Medicine | Admitting: Internal Medicine

## 2022-10-27 DIAGNOSIS — C719 Malignant neoplasm of brain, unspecified: Secondary | ICD-10-CM

## 2022-10-27 MED ORDER — GADOPICLENOL 0.5 MMOL/ML IV SOLN
7.5000 mL | Freq: Once | INTRAVENOUS | Status: AC | PRN
  Administered 2022-10-27: 7.5 mL via INTRAVENOUS

## 2022-10-27 NOTE — Progress Notes (Signed)
Robert Watkins presents today for follow up after completing treatment to his brain on 10-06-22. Pt had MRI of this brain on 10-27-22.   MR BRAIN W WO CONTRAST  10/27/2022  --IMPRESSION: Considerable imaging improvement since the immediate prior study of 08/17/2022. Near complete return to the appearance seen on the next prior examination of 08/03/2022. With respect to the region of the tumor in the left hemisphere, T2 and FLAIR signal is diminished and abnormal contrast enhancement has returned to the appearance seen on the prior examination of 08/03/2022. There is no longer any enhancement seen crossing the midline in the splenium of the corpus callosum. Enhancement previously seen at the tip of the left occipital horn is diminished. Small focus of enhancement along the margin of the left occipital resection site does persist. Whereas the majority of the findings have returned to the appearance of 08/03/2022, there does continue to be an increase in enhancement along the anterior inferior extent when compared to the study of 08/03/2022. This area is marked with double arrows, for example see image 82 of series 19 .  Recent neurologic symptoms, if any:  Seizures: Denies Headaches: Reports occasional, mild headaches in the evenings. States they usually resolve on their own, otherwise he manages with OTC Tylenol Nausea: Denies but does report a decreased appetite Wt Readings from Last 3 Encounters:  11/10/22 164 lb 4 oz (74.5 kg)  11/02/22 166 lb 6.4 oz (75.5 kg)  10/10/22 169 lb (76.7 kg)   Dizziness/ataxia: Denies Difficulty with hand coordination: Denies Focal numbness/weakness: Denies Visual deficits/changes: Denies Confusion/Memory deficits: Reports occasional difficulty with short term memory or finding his words  Other issues of note: Continues to deal with fatigue. Reports he's sleeping about 8-10 hours in small breaks throughout the day. Trying to run more regularly. Last Saw Dr. Barbaraann Cao on  11/02/2022

## 2022-10-30 ENCOUNTER — Encounter: Payer: Self-pay | Admitting: *Deleted

## 2022-10-30 NOTE — Progress Notes (Signed)
Requested MRI Brain be pushed to [facility]

## 2022-10-31 ENCOUNTER — Ambulatory Visit: Payer: Managed Care, Other (non HMO) | Admitting: Internal Medicine

## 2022-10-31 ENCOUNTER — Telehealth: Payer: Self-pay | Admitting: *Deleted

## 2022-10-31 ENCOUNTER — Other Ambulatory Visit: Payer: Managed Care, Other (non HMO)

## 2022-10-31 ENCOUNTER — Ambulatory Visit: Payer: Managed Care, Other (non HMO)

## 2022-10-31 NOTE — Telephone Encounter (Signed)
Requested that all brain MRI images since 04/2021 be powershared to [facility] per Dr Liana Gerold request.

## 2022-11-02 ENCOUNTER — Inpatient Hospital Stay (HOSPITAL_BASED_OUTPATIENT_CLINIC_OR_DEPARTMENT_OTHER): Payer: Managed Care, Other (non HMO) | Admitting: Internal Medicine

## 2022-11-02 ENCOUNTER — Inpatient Hospital Stay: Payer: Managed Care, Other (non HMO) | Attending: Internal Medicine

## 2022-11-02 ENCOUNTER — Inpatient Hospital Stay: Payer: Managed Care, Other (non HMO)

## 2022-11-02 ENCOUNTER — Telehealth: Payer: Self-pay | Admitting: *Deleted

## 2022-11-02 VITALS — BP 116/69 | HR 58 | Temp 98.1°F | Resp 20 | Wt 166.4 lb

## 2022-11-02 DIAGNOSIS — Z923 Personal history of irradiation: Secondary | ICD-10-CM | POA: Insufficient documentation

## 2022-11-02 DIAGNOSIS — Z79899 Other long term (current) drug therapy: Secondary | ICD-10-CM | POA: Insufficient documentation

## 2022-11-02 DIAGNOSIS — Z5189 Encounter for other specified aftercare: Secondary | ICD-10-CM | POA: Diagnosis not present

## 2022-11-02 DIAGNOSIS — Z9221 Personal history of antineoplastic chemotherapy: Secondary | ICD-10-CM | POA: Insufficient documentation

## 2022-11-02 DIAGNOSIS — C719 Malignant neoplasm of brain, unspecified: Secondary | ICD-10-CM

## 2022-11-02 DIAGNOSIS — G40909 Epilepsy, unspecified, not intractable, without status epilepticus: Secondary | ICD-10-CM

## 2022-11-02 DIAGNOSIS — C714 Malignant neoplasm of occipital lobe: Secondary | ICD-10-CM | POA: Insufficient documentation

## 2022-11-02 LAB — CBC WITH DIFFERENTIAL (CANCER CENTER ONLY)
Abs Immature Granulocytes: 0.01 10*3/uL (ref 0.00–0.07)
Basophils Absolute: 0 10*3/uL (ref 0.0–0.1)
Basophils Relative: 0 %
Eosinophils Absolute: 0 10*3/uL (ref 0.0–0.5)
Eosinophils Relative: 1 %
HCT: 43.4 % (ref 39.0–52.0)
Hemoglobin: 16.1 g/dL (ref 13.0–17.0)
Immature Granulocytes: 0 %
Lymphocytes Relative: 11 %
Lymphs Abs: 0.4 10*3/uL — ABNORMAL LOW (ref 0.7–4.0)
MCH: 36.1 pg — ABNORMAL HIGH (ref 26.0–34.0)
MCHC: 37.1 g/dL — ABNORMAL HIGH (ref 30.0–36.0)
MCV: 97.3 fL (ref 80.0–100.0)
Monocytes Absolute: 0.2 10*3/uL (ref 0.1–1.0)
Monocytes Relative: 6 %
Neutro Abs: 3.3 10*3/uL (ref 1.7–7.7)
Neutrophils Relative %: 82 %
Platelet Count: 104 10*3/uL — ABNORMAL LOW (ref 150–400)
RBC: 4.46 MIL/uL (ref 4.22–5.81)
RDW: 11.5 % (ref 11.5–15.5)
WBC Count: 4.1 10*3/uL (ref 4.0–10.5)
nRBC: 0 % (ref 0.0–0.2)

## 2022-11-02 MED ORDER — SODIUM CHLORIDE 0.9 % IV SOLN: Freq: Once | INTRAVENOUS | Status: AC

## 2022-11-02 MED ORDER — SODIUM CHLORIDE 0.9 % IV SOLN
10.0000 mg/kg | Freq: Once | INTRAVENOUS | Status: AC
  Administered 2022-11-02: 800 mg via INTRAVENOUS
  Filled 2022-11-02: qty 32

## 2022-11-02 NOTE — Telephone Encounter (Signed)
Restricted Return to Work Statement faxed to [employer], fax #[phone].  Fax confirmation received.

## 2022-11-02 NOTE — Progress Notes (Signed)
[facility] [address]  [place], [place] [address] [phone]   Interval Evaluation  Date of Service: 11/02/22 Patient Name: Robert Watkins Patient MRN: [id number] Patient DOB: Jun 07, 1985 Provider: Henreitta Leber, MD  Identifying Statement:  Robert Watkins is a 37 y.o. 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 Barnett Abu in [place]. 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 Lavonna Monarch. Pathology confirms anaplastic astrocytoma.   06/01/2010 Clinical Event-Other  New patient evaluation at [facility]. Recommend radiation therapy with concurrent Temozolomide followed by twelve cycles of five-day Temozolomide.   06/13/2010 - 08/01/2010 Radiation  Radiation with Temodar.  06/13/2010 - 08/01/2010 Chemotherapy  Temodar 75 mg/m2 with radiation.  08/16/2010 - 08/07/2011 Chemotherapy  5 day Temodar 200 mg/m2  02/19/2018 Progression  Progression of non-enhancing tumor compared to 2013. Recommend initiating metronomic daily Temodar 50 mg/m2/day.  02/12/20 Progression Initiates Tibsovo 500mg  daily  10/21/20 Progression Adds oral CCNU 90mg /m2 q6 weeks to daily Tibsovo  08/06/22 Progression Repeat IMRT with concurrent Avastin 10mg /kg IV q2 weeks  Interval History: Robert Watkins presents today for follow up, now having completed full 6 weeks of IMRT and avastin.  Recently had post-RT MRI study, as well as visit with Dr. Sol Passer at [facility].  Tolerated treatment well overall. No clinical changes today.  Burden of fatigue is stable.  No headaches or seizures.  He remains active and functionally independent.  Anxiety continues to be a  problem.  Medications: Current Outpatient Medications on File Prior to Visit  Medication Sig Dispense Refill   clonazePAM (KLONOPIN) 0.5 MG tablet Take 1 tablet (0.5 mg total) by mouth 2 (two) times daily as needed for anxiety. 60 tablet 0   ivosidenib (TIBSOVO) 250 MG tablet Take 2 tablets (500 mg total) by mouth daily. (Patient not taking: Reported on 09/12/2022) 60 tablet 0   lamoTRIgine (LAMICTAL) 100 MG tablet Take 1 tablet (100 mg total) by mouth daily. 60 tablet 1   No current facility-administered medications on file prior to visit.    Allergies:  No Active Allergies  Past Medical History:  Past Medical History:  Diagnosis Date   Cancer of brain (HCC) 04/07/2011   Depression, reactive 04/07/2011   History of chemotherapy 06/13/2010-08/01/2010   42 day Temodar with concurrent Radiation   History of chemotherapy 08/16/2010-08/07/2011   5 day Temodar 200 mg/m2   History of radiation therapy 06/13/2010-08/01/2010   [facility]. Recommend radiation therapy with concurrent Temozolomide followed by twelve cycles of five-day Temozolomide. ?   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 Lavonna Monarch. Pathology confirms anaplastic astrocytoma. ?   CRANIOTOMY FOR STEREOTACTIC GUIDED SURGERY Left 05/12/2010   Stereotactic biopsy of left occipital lesion by Dr. Barnett Abu in [place]. Pathology demonstrates anaplastic astrocytoma (WHO III)?   Social History:  Social History   Socioeconomic History   Marital status: Single    Spouse name: Not on file   Number of children: Not on file   Years of education: Not on file  Highest education level: Not on file  Occupational History   Not on file  Tobacco Use   Smoking status:  Former   Smokeless tobacco: Never  Substance and Sexual Activity   Alcohol use: Yes    Alcohol/week: 4.0 standard drinks of alcohol    Types: 4 Shots of liquor per week   Drug use: Yes    Types: Marijuana   Sexual activity: Yes  Other Topics Concern   Not on file  Social History Narrative   Not on file   Social Determinants of Health   Financial Resource Strain: Not on file  Food Insecurity: No Food Insecurity (08/21/2022)   Hunger Vital Sign    Worried About Running Out of Food in the Last Year: Never true    Ran Out of Food in the Last Year: Never true  Transportation Needs: No Transportation Needs (08/21/2022)   PRAPARE - Administrator, Civil Service (Medical): No    Lack of Transportation (Non-Medical): No  Physical Activity: Not on file  Stress: Not on file  Social Connections: Not on file  Intimate Partner Violence: Not At Risk (08/21/2022)   Humiliation, Afraid, Rape, and Kick questionnaire    Fear of Current or Ex-Partner: No    Emotionally Abused: No    Physically Abused: No    Sexually Abused: No   Family History: No family history on file.  Review of Systems: Constitutional: Denies fevers, chills or abnormal weight loss Eyes: Denies blurriness of vision Ears, nose, mouth, throat, and face: Denies mucositis or sore throat Respiratory: Denies cough, dyspnea or wheezes Cardiovascular: Denies palpitation, chest discomfort or lower extremity swelling Gastrointestinal:  Denies nausea, constipation, diarrhea GU: Denies dysuria or incontinence Skin: Denies abnormal skin rashes Neurological: Per HPI Musculoskeletal: Denies joint pain, back or neck discomfort. No decrease in ROM Behavioral/Psych: Denies anxiety, disturbance in thought content, and mood instability  Physical Exam: Vitals:   11/02/22 1320  BP: 116/69  Pulse: (!) 58  Resp: 20  Temp: 98.1 F (36.7 C)  SpO2: 100%    KPS: 90. General: Alert, cooperative, pleasant, in no acute  distress Head: Normal EENT: No conjunctival injection or scleral icterus. Oral mucosa moist Lungs: Resp effort normal Cardiac: Regular rate and rhythm Abdomen: Soft, non-distended abdomen Skin: No rashes cyanosis or petechiae. Extremities: No clubbing or edema  Neurologic Exam: Mental Status: Awake, alert, attentive to examiner. Oriented to self and environment. Language is fluent with intact comprehension.  Cranial Nerves: Visual acuity is grossly normal. Visual fields are full. Extra-ocular movements intact. No ptosis. Face is symmetric, tongue midline. Motor: Tone and bulk are normal. Power is full in both arms and legs. Reflexes are symmetric, no pathologic reflexes present. Intact finger to nose bilaterally Sensory: Intact to light touch and temperature Gait: Normal and tandem gait is normal.   Labs: I have reviewed the data as listed    Component Value Date/Time   NA 139 09/12/2022 1341   K 4.4 09/12/2022 1341   CL 105 09/12/2022 1341   CO2 29 09/12/2022 1341   GLUCOSE 121 (H) 09/12/2022 1341   BUN 20 09/12/2022 1341   CREATININE 1.19 09/12/2022 1341   CALCIUM 9.5 09/12/2022 1341   PROT 6.7 09/12/2022 1341   ALBUMIN 4.6 09/12/2022 1341   AST 24 09/12/2022 1341   ALT 36 09/12/2022 1341   ALKPHOS 64 09/12/2022 1341   BILITOT 1.0 09/12/2022 1341   GFRNONAA >60 09/12/2022 1341   GFRAA >60 08/19/2018 0854  Lab Results  Component Value Date   WBC 2.8 (L) 10/10/2022   NEUTROABS 2.2 10/10/2022   HGB 15.2 10/10/2022   HCT 41.3 10/10/2022   MCV 100.0 10/10/2022   PLT 89 (L) 10/10/2022    Imaging:  [facility] Clinician Interpretation: I have personally reviewed the CNS images as listed.  My interpretation, in the context of the patient's clinical presentation, is stable disease  MR BRAIN W WO CONTRAST  Result Date: 10/31/2022 CLINICAL DATA:  Brain/CNS neoplasm. Assess treatment response. Anaplastic astrocytoma. EXAM: MRI HEAD WITHOUT AND WITH CONTRAST TECHNIQUE:  Multiplanar, multiecho pulse sequences of the brain and surrounding structures were obtained without and with intravenous contrast. CONTRAST:  7.5 cc Vueway COMPARISON:  08/17/2022.  08/03/2022.  06/01/2022. FINDINGS: Brain: Considerable imaging improvement since the study of 08/17/2022. Near complete return to the appearance seen on the prior examination of 08/03/2022. With respect to the region of the tumor in the left hemisphere, T2 and FLAIR signal is diminished and abnormal contrast enhancement has returned to the appearance showing abnormal enhancement in the white matter lateral and adjacent to the atrium of the left lateral ventricle. The enhancement is patchy and much less confluent than was seen on 08/17/2022. There is no longer any enhancement seen crossing the midline in the splenium of the corpus callosum. Enhancement previously seen at the tip of the left occipital horn is diminished. Small focus of enhancement along the margin of the left occipital resection site does persist. Whereas the majority of the findings have returned to the appearance of 08/03/2022, there does continue to be an increase in enhancement along the anterior inferior extent when compared to the study of 08/03/2022. this area is marked with double arrows, for example image 82 of series 19. Vascular: Major vessels at the base of the brain show flow. Skull and upper cervical spine: No acute or significant finding. Postoperative changes related to previous craniotomy in the left parieto-occipital region. Sinuses/Orbits: Clear/normal Other: None IMPRESSION: Considerable imaging improvement since the immediate prior study of 08/17/2022. Near complete return to the appearance seen on the next prior examination of 08/03/2022. With respect to the region of the tumor in the left hemisphere, T2 and FLAIR signal is diminished and abnormal contrast enhancement has returned to the appearance seen on the prior examination of 08/03/2022. There  is no longer any enhancement seen crossing the midline in the splenium of the corpus callosum. Enhancement previously seen at the tip of the left occipital horn is diminished. Small focus of enhancement along the margin of the left occipital resection site does persist. Whereas the majority of the findings have returned to the appearance of 08/03/2022, there does continue to be an increase in enhancement along the anterior inferior extent when compared to the study of 08/03/2022. This area is marked with double arrows, for example see image 82 of series 19 . Electronically Signed   By: Paulina Fusi M.D.   On: 10/31/2022 10:53     Assessment/Plan 1. Anaplastic astrocytoma [facility])  Mr. Cates is clinically stable today, now having completed full 6 weeks of repeat IMRT with concurrent avastin.  MRI brain demonstrates stable or improved findings at treatment site.    Avastin will be continue administered given high risk of radio-inflammatory syndrome.    We ultimately recommended continuing IV avastin 10mg /kg q2-3 weeks.    Avastin should be held for the following:  ANC less than 500  Platelets less than 50,000  LFT or creatinine greater than 2x ULN  If clinical concerns/contraindications develop  Should hold off on resuming Tibsovo for the time being, following discussion with [facility] team.  Could be good candidate for vorasidenib once it is approved.  Will con't Lamictal 100mg  daily.  Ok with Klonopin 0.5mg  BID for anxiety.  We will have LCSW reach out to him for counseling as well.  We appreciate the opportunity to participate in the care of Vihaan Gloss.    We ask that Krayton Wortley return to clinic in 2 weeks for avastin infusion, or sooner as needed.  All questions were answered. The patient knows to call the clinic with any problems, questions or concerns. No barriers to learning were detected.  I have spent a total of 40 minutes of face-to-face and non-face-to-face time, excluding  clinical staff time, preparing to see patient, ordering tests and/or medications, counseling the patient, and independently interpreting results and communicating results to the patient/family/caregiver    Henreitta Leber, MD Medical Director of Neuro-Oncology [facility] 11/02/22 1:14 PM

## 2022-11-02 NOTE — Patient Instructions (Signed)
[facility]  Discharge Instructions: Thank you for choosing [facility] to provide your oncology and hematology care.   If you have a lab appointment with the Cancer Center, please go directly to the Cancer Center and check in at the registration area.   Wear comfortable clothing and clothing appropriate for easy access to any Portacath or PICC line.   We strive to give you quality time with your provider. You may need to reschedule your appointment if you arrive late (15 or more minutes).  Arriving late affects you and other patients whose appointments are after yours.  Also, if you miss three or more appointments without notifying the office, you may be dismissed from the clinic at the provider's discretion.      For prescription refill requests, have your pharmacy contact our office and allow 72 hours for refills to be completed.    Today you received the following chemotherapy and/or immunotherapy agents avastin      To help prevent nausea and vomiting after your treatment, we encourage you to take your nausea medication as directed.  BELOW ARE SYMPTOMS THAT SHOULD BE REPORTED IMMEDIATELY: *FEVER GREATER THAN 100.4 F (38 C) OR HIGHER *CHILLS OR SWEATING *NAUSEA AND VOMITING THAT IS NOT CONTROLLED WITH YOUR NAUSEA MEDICATION *UNUSUAL SHORTNESS OF BREATH *UNUSUAL BRUISING OR BLEEDING *URINARY PROBLEMS (pain or burning when urinating, or frequent urination) *BOWEL PROBLEMS (unusual diarrhea, constipation, pain near the anus) TENDERNESS IN MOUTH AND THROAT WITH OR WITHOUT PRESENCE OF ULCERS (sore throat, sores in mouth, or a toothache) UNUSUAL RASH, SWELLING OR PAIN  UNUSUAL VAGINAL DISCHARGE OR ITCHING   Items with * indicate a potential emergency and should be followed up as soon as possible or go to the Emergency Department if any problems should occur.  Please show the CHEMOTHERAPY ALERT CARD or IMMUNOTHERAPY ALERT CARD at check-in  to the Emergency Department and triage nurse.  Should you have questions after your visit or need to cancel or reschedule your appointment, please contact [facility]  Dept: [phone]  and follow the prompts.  Office hours are 8:00 a.m. to 4:30 p.m. Monday - Friday. Please note that voicemails left after 4:00 p.m. may not be returned until the following business day.  We are closed weekends and major holidays. You have access to a nurse at all times for urgent questions. Please call the main number to the clinic Dept: [phone] and follow the prompts.   For any non-urgent questions, you may also contact your provider using MyChart. We now offer e-Visits for anyone 66 and older to request care online for non-urgent symptoms. For details visit [email or web address].   Also download the MyChart app! Go to the app store, search "MyChart", open the app, select [place], and log in with your MyChart username and password.

## 2022-11-04 ENCOUNTER — Other Ambulatory Visit: Payer: Self-pay

## 2022-11-06 ENCOUNTER — Telehealth: Payer: Self-pay | Admitting: Internal Medicine

## 2022-11-06 NOTE — Telephone Encounter (Signed)
Scheduled per 07/11 los, patient has been called and voicemail was left.

## 2022-11-10 ENCOUNTER — Encounter: Payer: Self-pay | Admitting: Radiation Oncology

## 2022-11-10 ENCOUNTER — Other Ambulatory Visit: Payer: Self-pay

## 2022-11-10 ENCOUNTER — Ambulatory Visit
Admission: RE | Admit: 2022-11-10 | Discharge: 2022-11-10 | Disposition: A | Discharge status: Home / Self Care | Payer: Managed Care, Other (non HMO) | Source: Ambulatory Visit | Attending: Radiation Oncology | Admitting: Radiation Oncology

## 2022-11-10 VITALS — BP 122/74 | HR 72 | Temp 98.0°F | Resp 18 | Ht 71.0 in | Wt 164.2 lb

## 2022-11-10 DIAGNOSIS — C714 Malignant neoplasm of occipital lobe: Secondary | ICD-10-CM | POA: Diagnosis not present

## 2022-11-10 DIAGNOSIS — Z79899 Other long term (current) drug therapy: Secondary | ICD-10-CM | POA: Insufficient documentation

## 2022-11-10 NOTE — Progress Notes (Signed)
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  Robert Watkins presents today for follow up after completing treatment to his brain on 10-06-22. Pt had MRI of his brain on 10-27-22 with improved tumor status  Recent neurologic symptoms, if any:  Seizures: Denies Headaches: Reports occasional, mild headaches in the evenings. States they usually resolve on their own, otherwise he manages with OTC Tylenol Nausea: Denies but does report a decreased appetite Wt Readings from Last 3 Encounters:  11/10/22 164 lb 4 oz (74.5 kg)  11/02/22 166 lb 6.4 oz (75.5 kg)  10/10/22 169 lb (76.7 kg)   Dizziness/ataxia: Denies Difficulty with hand coordination: Denies Focal numbness/weakness: Denies Visual deficits/changes: Denies Confusion/Memory deficits: Reports occasional difficulty with short term memory or finding his words  Other issues of note: Continues to deal with fatigue. Reports he's sleeping about 8-10 hours in small breaks throughout the day. Trying to run more regularly. Last Saw Dr. Barbaraann Cao on 11/02/2022                               ALLERGIES:  has no active allergies.  Meds: Current Outpatient Medications  Medication Sig Dispense Refill   atomoxetine (STRATTERA) 40 MG capsule Take 40 mg by mouth  daily.     clonazePAM (KLONOPIN) 0.5 MG tablet Take 1 tablet (0.5 mg total) by mouth 2 (two) times daily as needed for anxiety. 60 tablet 0   ivosidenib (TIBSOVO) 250 MG tablet Take 2 tablets (500 mg total) by mouth daily. (Patient not taking: Reported on 11/02/2022) 60 tablet 0   lamoTRIgine (LAMICTAL) 100 MG tablet Take 1 tablet (100 mg total) by mouth daily. 60 tablet 1   No current facility-administered medications for this encounter.    Physical Findings: The patient is in no acute distress. Patient is alert and oriented.  height is 5\' 11"  (1.803 m) and weight is 164 lb 4 oz (74.5 kg). His temporal temperature is 98 F (36.7 C). His blood pressure is 122/74 and his pulse is 72. His respiration is 18 and oxygen saturation is 100%. .    General: Alert and oriented, in no acute distress HEENT: Head is normocephalic. Extraocular movements are intact. Oropharynx is clear. Alopecia posteriorly Extremities: No cyanosis or edema. Lymphatics: see Neck Exam Skin: No concerning lesions. Musculoskeletal: symmetric strength and muscle tone throughout. Neurologic: Cranial nerves II through XII are grossly intact. No obvious focalities. Speech is fluent. Coordination is intact. Psychiatric: Judgment and insight are intact. Affect is appropriate.    Lab Findings: Lab Results  Component Value Date   WBC 4.1 11/02/2022   HGB 16.1 11/02/2022   HCT 43.4 11/02/2022   MCV 97.3 11/02/2022   PLT 104 (L) 11/02/2022    Radiographic  Findings: MR BRAIN W WO CONTRAST  Result Date: 10/31/2022 CLINICAL DATA:  Brain/CNS neoplasm. Assess treatment response. Anaplastic astrocytoma. EXAM: MRI HEAD WITHOUT AND WITH CONTRAST TECHNIQUE: Multiplanar, multiecho pulse sequences of the brain and surrounding structures were obtained without and with intravenous contrast. CONTRAST:  7.5 cc Vueway COMPARISON:  08/17/2022.  08/03/2022.  06/01/2022. FINDINGS: Brain: Considerable imaging improvement since the study of  08/17/2022. Near complete return to the appearance seen on the prior examination of 08/03/2022. With respect to the region of the tumor in the left hemisphere, T2 and FLAIR signal is diminished and abnormal contrast enhancement has returned to the appearance showing abnormal enhancement in the white matter lateral and adjacent to the atrium of the left lateral ventricle. The enhancement is patchy and much less confluent than was seen on 08/17/2022. There is no longer any enhancement seen crossing the midline in the splenium of the corpus callosum. Enhancement previously seen at the tip of the left occipital horn is diminished. Small focus of enhancement along the margin of the left occipital resection site does persist. Whereas the majority of the findings have returned to the appearance of 08/03/2022, there does continue to be an increase in enhancement along the anterior inferior extent when compared to the study of 08/03/2022. this area is marked with double arrows, for example image 82 of series 19. Vascular: Major vessels at the base of the brain show flow. Skull and upper cervical spine: No acute or significant finding. Postoperative changes related to previous craniotomy in the left parieto-occipital region. Sinuses/Orbits: Clear/normal Other: None IMPRESSION: Considerable imaging improvement since the immediate prior study of 08/17/2022. Near complete return to the appearance seen on the next prior examination of 08/03/2022. With respect to the region of the tumor in the left hemisphere, T2 and FLAIR signal is diminished and abnormal contrast enhancement has returned to the appearance seen on the prior examination of 08/03/2022. There is no longer any enhancement seen crossing the midline in the splenium of the corpus callosum. Enhancement previously seen at the tip of the left occipital horn is diminished. Small focus of enhancement along the margin of the left occipital resection site does persist. Whereas  the majority of the findings have returned to the appearance of 08/03/2022, there does continue to be an increase in enhancement along the anterior inferior extent when compared to the study of 08/03/2022. This area is marked with double arrows, for example see image 82 of series 19 . Electronically Signed   By: Paulina Fusi M.D.   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  I will see him back PRN.  We talked again about counseling and he would like a referral to [employer].  Will re-refer.  I wishes him and his parents the very best.  On date of service, in total, I spent 30 minutes on this encounter. Patient was seen in person.  _____________________________________   Lonie Peak, MD

## 2022-11-13 ENCOUNTER — Encounter: Payer: Self-pay | Admitting: General Practice

## 2022-11-13 NOTE — Progress Notes (Signed)
[facility] Spiritual Care Note  Followed up with Anette Riedel by phone. He is interested in seeking out a Veterinary surgeon. Suggested psychologytoday.[email or web address]'s Find a Visual merchandiser as a tool that would give him some agency in his search. Normalized seeking support and the helpfulness of the timing (that is, because he doesn't feel urgency, there's opportunity to build rapport so that a therapeutic relationship is already in place when hard things arise).  We plan to follow up by phone in two weeks to check in about progress.   [address] [place], [place], [facility] Pager [phone] Voicemail [phone]

## 2022-11-14 ENCOUNTER — Other Ambulatory Visit: Payer: Self-pay | Admitting: *Deleted

## 2022-11-14 DIAGNOSIS — C719 Malignant neoplasm of brain, unspecified: Secondary | ICD-10-CM

## 2022-11-16 ENCOUNTER — Inpatient Hospital Stay: Payer: Managed Care, Other (non HMO)

## 2022-11-16 ENCOUNTER — Inpatient Hospital Stay (HOSPITAL_BASED_OUTPATIENT_CLINIC_OR_DEPARTMENT_OTHER): Payer: Managed Care, Other (non HMO) | Admitting: Internal Medicine

## 2022-11-16 VITALS — BP 137/81 | HR 54 | Temp 97.8°F | Resp 16

## 2022-11-16 VITALS — BP 120/81 | HR 53 | Temp 97.5°F | Resp 18 | Wt 164.0 lb

## 2022-11-16 DIAGNOSIS — G40909 Epilepsy, unspecified, not intractable, without status epilepticus: Secondary | ICD-10-CM

## 2022-11-16 DIAGNOSIS — C714 Malignant neoplasm of occipital lobe: Secondary | ICD-10-CM | POA: Diagnosis not present

## 2022-11-16 DIAGNOSIS — C719 Malignant neoplasm of brain, unspecified: Secondary | ICD-10-CM | POA: Diagnosis not present

## 2022-11-16 LAB — CBC WITH DIFFERENTIAL (CANCER CENTER ONLY)
Abs Immature Granulocytes: 0.01 10*3/uL (ref 0.00–0.07)
Basophils Absolute: 0 10*3/uL (ref 0.0–0.1)
Basophils Relative: 0 %
Eosinophils Absolute: 0.1 10*3/uL (ref 0.0–0.5)
Eosinophils Relative: 2 %
Hemoglobin: 17.2 g/dL — ABNORMAL HIGH (ref 13.0–17.0)
Immature Granulocytes: 0 %
Lymphocytes Relative: 20 %
Lymphs Abs: 0.6 10*3/uL — ABNORMAL LOW (ref 0.7–4.0)
Monocytes Absolute: 0.2 10*3/uL (ref 0.1–1.0)
Monocytes Relative: 8 %
Neutro Abs: 2.1 10*3/uL (ref 1.7–7.7)
Neutrophils Relative %: 70 %
Platelet Count: 116 10*3/uL — ABNORMAL LOW (ref 150–400)
WBC Count: 3.1 10*3/uL — ABNORMAL LOW (ref 4.0–10.5)
nRBC: 0 % (ref 0.0–0.2)

## 2022-11-16 LAB — TOTAL PROTEIN, URINE DIPSTICK: Protein, ur: NEGATIVE mg/dL

## 2022-11-16 MED ORDER — SODIUM CHLORIDE 0.9 % IV SOLN: Freq: Once | INTRAVENOUS | Status: AC

## 2022-11-16 MED ORDER — SODIUM CHLORIDE 0.9 % IV SOLN
10.0000 mg/kg | Freq: Once | INTRAVENOUS | Status: AC
  Administered 2022-11-16: 800 mg via INTRAVENOUS
  Filled 2022-11-16: qty 32

## 2022-11-16 NOTE — Patient Instructions (Signed)
[facility]  Discharge Instructions: Thank you for choosing [facility] to provide your oncology and hematology care.   If you have a lab appointment with the Cancer Center, please go directly to the Cancer Center and check in at the registration area.   Wear comfortable clothing and clothing appropriate for easy access to any Portacath or PICC line.   We strive to give you quality time with your provider. You may need to reschedule your appointment if you arrive late (15 or more minutes).  Arriving late affects you and other patients whose appointments are after yours.  Also, if you miss three or more appointments without notifying the office, you may be dismissed from the clinic at the provider's discretion.      For prescription refill requests, have your pharmacy contact our office and allow 72 hours for refills to be completed.    Today you received the following chemotherapy and/or immunotherapy agent: Bevacizumab (Mvasi)   To help prevent nausea and vomiting after your treatment, we encourage you to take your nausea medication as directed.  BELOW ARE SYMPTOMS THAT SHOULD BE REPORTED IMMEDIATELY: *FEVER GREATER THAN 100.4 F (38 C) OR HIGHER *CHILLS OR SWEATING *NAUSEA AND VOMITING THAT IS NOT CONTROLLED WITH YOUR NAUSEA MEDICATION *UNUSUAL SHORTNESS OF BREATH *UNUSUAL BRUISING OR BLEEDING *URINARY PROBLEMS (pain or burning when urinating, or frequent urination) *BOWEL PROBLEMS (unusual diarrhea, constipation, pain near the anus) TENDERNESS IN MOUTH AND THROAT WITH OR WITHOUT PRESENCE OF ULCERS (sore throat, sores in mouth, or a toothache) UNUSUAL RASH, SWELLING OR PAIN  UNUSUAL VAGINAL DISCHARGE OR ITCHING   Items with * indicate a potential emergency and should be followed up as soon as possible or go to the Emergency Department if any problems should occur.  Please show the CHEMOTHERAPY ALERT CARD or IMMUNOTHERAPY ALERT CARD at  check-in to the Emergency Department and triage nurse.  Should you have questions after your visit or need to cancel or reschedule your appointment, please contact [facility]  Dept: [phone]  and follow the prompts.  Office hours are 8:00 a.m. to 4:30 p.m. Monday - Friday. Please note that voicemails left after 4:00 p.m. may not be returned until the following business day.  We are closed weekends and major holidays. You have access to a nurse at all times for urgent questions. Please call the main number to the clinic Dept: [phone] and follow the prompts.   For any non-urgent questions, you may also contact your provider using MyChart. We now offer e-Visits for anyone 72 and older to request care online for non-urgent symptoms. For details visit [email or web address].   Also download the MyChart app! Go to the app store, search "MyChart", open the app, select [place], and log in with your MyChart username and password. Bevacizumab Injection What is this medication? BEVACIZUMAB (be va SIZ yoo mab) treats some types of cancer. It works by blocking a protein that causes cancer cells to grow and multiply. This helps to slow or stop the spread of cancer cells. It is a monoclonal antibody. This medicine may be used for other purposes; ask your health care provider or pharmacist if you have questions. COMMON BRAND NAME(S): Alymsys, Avastin, MVASI, Omer Jack What should I tell my care team before I take this medication? They need to know if you have any of these conditions: Blood clots Coughing up blood Having or recent surgery Heart failure High blood  pressure History of a connection between 2 or more body parts that do not usually connect (fistula) History of a tear in your stomach or intestines Protein in your urine An unusual or allergic reaction to bevacizumab, other medications, foods, dyes, or preservatives Pregnant or trying to get  pregnant Breast-feeding How should I use this medication? This medication is injected into a vein. It is given by your care team in a hospital or clinic setting. Talk to your care team the use of this medication in children. Special care may be needed. Overdosage: If you think you have taken too much of this medicine contact a poison control center or emergency room at once. NOTE: This medicine is only for you. Do not share this medicine with others. What if I miss a dose? Keep appointments for follow-up doses. It is important not to miss your dose. Call your care team if you are unable to keep an appointment. What may interact with this medication? Interactions are not expected. This list may not describe all possible interactions. Give your health care provider a list of all the medicines, herbs, non-prescription drugs, or dietary supplements you use. Also tell them if you smoke, drink alcohol, or use illegal drugs. Some items may interact with your medicine. What should I watch for while using this medication? Your condition will be monitored carefully while you are receiving this medication. You may need blood work while taking this medication. This medication may make you feel generally unwell. This is not uncommon as chemotherapy can affect healthy cells as well as cancer cells. Report any side effects. Continue your course of treatment even though you feel ill unless your care team tells you to stop. This medication may increase your risk to bruise or bleed. Call your care team if you notice any unusual bleeding. Before having surgery, talk to your care team to make sure it is ok. This medication can increase the risk of poor healing of your surgical site or wound. You will need to stop this medication for 28 days before surgery. After surgery, wait at least 28 days before restarting this medication. Make sure the surgical site or wound is healed enough before restarting this medication. Talk  to your care team if questions. Talk to your care team if you may be pregnant. Serious birth defects can occur if you take this medication during pregnancy and for 6 months after the last dose. Contraception is recommended while taking this medication and for 6 months after the last dose. Your care team can help you find the option that works for you. Do not breastfeed while taking this medication and for 6 months after the last dose. This medication can cause infertility. Talk to your care team if you are concerned about your fertility. What side effects may I notice from receiving this medication? Side effects that you should report to your care team as soon as possible: Allergic reactions--skin rash, itching, hives, swelling of the face, lips, tongue, or throat Bleeding--bloody or black, tar-like stools, vomiting blood or brown material that looks like coffee grounds, red or dark brown urine, small red or purple spots on skin, unusual bruising or bleeding Blood clot--pain, swelling, or warmth in the leg, shortness of breath, chest pain Heart attack--pain or tightness in the chest, shoulders, arms, or jaw, nausea, shortness of breath, cold or clammy skin, feeling faint or lightheaded Heart failure--shortness of breath, swelling of the ankles, feet, or hands, sudden weight gain, unusual weakness or fatigue Increase in  blood pressure Infection--fever, chills, cough, sore throat, wounds that don't heal, pain or trouble when passing urine, general feeling of discomfort or being unwell Infusion reactions--chest pain, shortness of breath or trouble breathing, feeling faint or lightheaded Kidney injury--decrease in the amount of urine, swelling of the ankles, hands, or feet Stomach pain that is severe, does not go away, or gets worse Stroke--sudden numbness or weakness of the face, arm, or leg, trouble speaking, confusion, trouble walking, loss of balance or coordination, dizziness, severe headache,  change in vision Sudden and severe headache, confusion, change in vision, seizures, which may be signs of posterior reversible encephalopathy syndrome (PRES) Side effects that usually do not require medical attention (report to your care team if they continue or are bothersome): Back pain Change in taste Diarrhea Dry skin Increased tears Nosebleed This list may not describe all possible side effects. Call your doctor for medical advice about side effects. You may report side effects to FDA at 1-800-FDA-1088. Where should I keep my medication? This medication is given in a hospital or clinic. It will not be stored at home. NOTE: This sheet is a summary. It may not cover all possible information. If you have questions about this medicine, talk to your doctor, pharmacist, or health care provider.  2024 Elsevier/Gold Standard (2021-08-26 00:00:00)

## 2022-11-16 NOTE — Progress Notes (Signed)
Patient seen by Dr. Driscilla Grammes are within treatment parameters.  Labs reviewed: and are within treatment parameters.  Per physician team, patient is ready for treatment and there are NO modifications to the treatment plan.

## 2022-11-16 NOTE — Progress Notes (Signed)
[facility] [address]  [place], [place] [address] [phone]   Interval Evaluation  Date of Service: 11/16/22 Patient Name: Robert Watkins Patient MRN: [id number] Patient DOB: 30-Aug-1985 Provider: Henreitta Leber, MD  Identifying Statement:  Robert Watkins is a 37 y.o. 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 Barnett Abu in [place]. 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 Lavonna Monarch. Pathology confirms anaplastic astrocytoma.   06/01/2010 Clinical Event-Other  New patient evaluation at [facility]. Recommend radiation therapy with concurrent Temozolomide followed by twelve cycles of five-day Temozolomide.   06/13/2010 - 08/01/2010 Radiation  Radiation with Temodar.  06/13/2010 - 08/01/2010 Chemotherapy  Temodar 75 mg/m2 with radiation.  08/16/2010 - 08/07/2011 Chemotherapy  5 day Temodar 200 mg/m2  02/19/2018 Progression  Progression of non-enhancing tumor compared to 2013. Recommend initiating metronomic daily Temodar 50 mg/m2/day.  02/12/20 Progression Initiates Tibsovo 500mg  daily  10/21/20 Progression Adds oral CCNU 90mg /m2 q6 weeks to daily Tibsovo  08/06/22 Progression Repeat IMRT with concurrent Avastin 10mg /kg IV q2 weeks  11/02/22  Transitions to avastin monotherapy   Interval History: Robert Watkins presents today for follow up and avastin infusion.  No new or progressive changes.  Burden of fatigue is stable.  No headaches or seizures.  He remains active and functionally independent.  Anxiety continues to be a problem.  Medications: Current Outpatient Medications on File Prior to Visit  Medication Sig  Dispense Refill   atomoxetine (STRATTERA) 40 MG capsule Take 40 mg by mouth daily.     clonazePAM (KLONOPIN) 0.5 MG tablet Take 1 tablet (0.5 mg total) by mouth 2 (two) times daily as needed for anxiety. 60 tablet 0   ivosidenib (TIBSOVO) 250 MG tablet Take 2 tablets (500 mg total) by mouth daily. 60 tablet 0   lamoTRIgine (LAMICTAL) 100 MG tablet Take 1 tablet (100 mg total) by mouth daily. 60 tablet 1   No current facility-administered medications on file prior to visit.    Allergies:  No Active Allergies  Past Medical History:  Past Medical History:  Diagnosis Date   Cancer of brain (HCC) 04/07/2011   Depression, reactive 04/07/2011   History of chemotherapy 06/13/2010-08/01/2010   42 day Temodar with concurrent Radiation   History of chemotherapy 08/16/2010-08/07/2011   5 day Temodar 200 mg/m2   History of radiation therapy 06/13/2010-08/01/2010   [facility] Recommend radiation therapy with concurrent Temozolomide followed by twelve cycles of five-day Temozolomide. ?   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 Lavonna Monarch. Pathology confirms anaplastic astrocytoma. ?   CRANIOTOMY FOR STEREOTACTIC GUIDED SURGERY Left 05/12/2010   Stereotactic biopsy of left occipital lesion by Dr. Barnett Abu in [place]. Pathology demonstrates anaplastic astrocytoma (WHO III)?   Social History:  Social History   Socioeconomic History   Marital status: Single    Spouse name: Not on file   Number of children: Not on file   Years of education: Not on file   Highest education level: Not on file  Occupational History   Not on file  Tobacco Use   Smoking status: Former   Smokeless tobacco: Never  Substance and Sexual Activity    Alcohol use: Yes    Alcohol/week: 4.0 standard drinks of alcohol    Types: 4 Shots of liquor per week   Drug use: Yes    Types: Marijuana   Sexual activity: Yes  Other Topics Concern   Not on file  Social History Narrative   Not on file   Social Determinants of Health   Financial Resource Strain: Not on file  Food Insecurity: No Food Insecurity (08/21/2022)   Hunger Vital Sign    Worried About Running Out of Food in the Last Year: Never true    Ran Out of Food in the Last Year: Never true  Transportation Needs: No Transportation Needs (08/21/2022)   PRAPARE - Administrator, Civil Service (Medical): No    Lack of Transportation (Non-Medical): No  Physical Activity: Not on file  Stress: Not on file  Social Connections: Not on file  Intimate Partner Violence: Not At Risk (08/21/2022)   Humiliation, Afraid, Rape, and Kick questionnaire    Fear of Current or Ex-Partner: No    Emotionally Abused: No    Physically Abused: No    Sexually Abused: No   Family History: No family history on file.  Review of Systems: Constitutional: Denies fevers, chills or abnormal weight loss Eyes: Denies blurriness of vision Ears, nose, mouth, throat, and face: Denies mucositis or sore throat Respiratory: Denies cough, dyspnea or wheezes Cardiovascular: Denies palpitation, chest discomfort or lower extremity swelling Gastrointestinal:  Denies nausea, constipation, diarrhea GU: Denies dysuria or incontinence Skin: Denies abnormal skin rashes Neurological: Per HPI Musculoskeletal: Denies joint pain, back or neck discomfort. No decrease in ROM Behavioral/Psych: Denies anxiety, disturbance in thought content, and mood instability  Physical Exam: Vitals:   11/16/22 1355  BP: 120/81  Pulse: (!) 53  Resp: 18  Temp: (!) 97.5 F (36.4 C)  SpO2: 100%    KPS: 90. General: Alert, cooperative, pleasant, in no acute distress Head: Normal EENT: No conjunctival injection or scleral  icterus. Oral mucosa moist Lungs: Resp effort normal Cardiac: Regular rate and rhythm Abdomen: Soft, non-distended abdomen Skin: No rashes cyanosis or petechiae. Extremities: No clubbing or edema  Neurologic Exam: Mental Status: Awake, alert, attentive to examiner. Oriented to self and environment. Language is fluent with intact comprehension.  Cranial Nerves: Visual acuity is grossly normal. Visual fields are full. Extra-ocular movements intact. No ptosis. Face is symmetric, tongue midline. Motor: Tone and bulk are normal. Power is full in both arms and legs. Reflexes are symmetric, no pathologic reflexes present. Intact finger to nose bilaterally Sensory: Intact to light touch and temperature Gait: Normal and tandem gait is normal.   Labs: I have reviewed the data as listed    Component Value Date/Time   NA 139 09/12/2022 1341   K 4.4 09/12/2022 1341   CL 105 09/12/2022 1341   CO2 29 09/12/2022 1341   GLUCOSE 121 (H) 09/12/2022 1341   BUN 20 09/12/2022 1341   CREATININE 1.19 09/12/2022 1341   CALCIUM 9.5 09/12/2022 1341   PROT 6.7 09/12/2022 1341   ALBUMIN 4.6 09/12/2022 1341   AST 24 09/12/2022 1341   ALT 36 09/12/2022 1341   ALKPHOS 64 09/12/2022 1341   BILITOT 1.0 09/12/2022 1341   GFRNONAA >60 09/12/2022 1341   GFRAA >60 08/19/2018 0854   Lab Results  Component Value Date  WBC 3.1 (L) 11/16/2022   NEUTROABS 2.1 11/16/2022   HGB 17.2 (H) 11/16/2022   HCT RESULTS UNAVAILABLE DUE TO INTERFERING SUBSTANCE 11/16/2022   MCV RESULTS UNAVAILABLE DUE TO INTERFERING SUBSTANCE 11/16/2022   PLT 116 (L) 11/16/2022    Imaging:  [facility] Clinician Interpretation: I have personally reviewed the CNS images as listed.  My interpretation, in the context of the patient's clinical presentation, is stable disease  MR BRAIN W WO CONTRAST  Result Date: 10/31/2022 CLINICAL DATA:  Brain/CNS neoplasm. Assess treatment response. Anaplastic astrocytoma. EXAM: MRI HEAD WITHOUT AND WITH  CONTRAST TECHNIQUE: Multiplanar, multiecho pulse sequences of the brain and surrounding structures were obtained without and with intravenous contrast. CONTRAST:  7.5 cc Vueway COMPARISON:  08/17/2022.  08/03/2022.  06/01/2022. FINDINGS: Brain: Considerable imaging improvement since the study of 08/17/2022. Near complete return to the appearance seen on the prior examination of 08/03/2022. With respect to the region of the tumor in the left hemisphere, T2 and FLAIR signal is diminished and abnormal contrast enhancement has returned to the appearance showing abnormal enhancement in the white matter lateral and adjacent to the atrium of the left lateral ventricle. The enhancement is patchy and much less confluent than was seen on 08/17/2022. There is no longer any enhancement seen crossing the midline in the splenium of the corpus callosum. Enhancement previously seen at the tip of the left occipital horn is diminished. Small focus of enhancement along the margin of the left occipital resection site does persist. Whereas the majority of the findings have returned to the appearance of 08/03/2022, there does continue to be an increase in enhancement along the anterior inferior extent when compared to the study of 08/03/2022. this area is marked with double arrows, for example image 82 of series 19. Vascular: Major vessels at the base of the brain show flow. Skull and upper cervical spine: No acute or significant finding. Postoperative changes related to previous craniotomy in the left parieto-occipital region. Sinuses/Orbits: Clear/normal Other: None IMPRESSION: Considerable imaging improvement since the immediate prior study of 08/17/2022. Near complete return to the appearance seen on the next prior examination of 08/03/2022. With respect to the region of the tumor in the left hemisphere, T2 and FLAIR signal is diminished and abnormal contrast enhancement has returned to the appearance seen on the prior examination of  08/03/2022. There is no longer any enhancement seen crossing the midline in the splenium of the corpus callosum. Enhancement previously seen at the tip of the left occipital horn is diminished. Small focus of enhancement along the margin of the left occipital resection site does persist. Whereas the majority of the findings have returned to the appearance of 08/03/2022, there does continue to be an increase in enhancement along the anterior inferior extent when compared to the study of 08/03/2022. This area is marked with double arrows, for example see image 82 of series 19 . Electronically Signed   By: Paulina Fusi M.D.   On: 10/31/2022 10:53     Assessment/Plan 1. Anaplastic astrocytoma [facility])  Mr. Holzman is clinically stable today.  Labs are within normal limits.  Avastin will be continue administered given high risk of radio-inflammatory syndrome.    We ultimately recommended continuing IV avastin 10mg /kg q2-3 weeks.    Avastin should be held for the following:  ANC less than 500  Platelets less than 50,000  LFT or creatinine greater than 2x ULN  If clinical concerns/contraindications develop  Will remain off Tibsovo.  Will con't Lamictal 100mg  daily.  Ok with Klonopin 0.5mg  BID for anxiety.    We appreciate the opportunity to participate in the care of Robert Watkins.    We ask that Robert Watkins return to clinic in 2-3 weeks for avastin infusion, or sooner as needed.  All questions were answered. The patient knows to call the clinic with any problems, questions or concerns. No barriers to learning were detected.  I have spent a total of 30 minutes of face-to-face and non-face-to-face time, excluding clinical staff time, preparing to see patient, ordering tests and/or medications, counseling the patient, and independently interpreting results and communicating results to the patient/family/caregiver    Henreitta Leber, MD Medical Director of Neuro-Oncology [facility] [place] [facility] 11/16/22 3:45 PM

## 2022-11-19 ENCOUNTER — Other Ambulatory Visit: Payer: Self-pay

## 2022-11-20 ENCOUNTER — Telehealth: Payer: Self-pay | Admitting: Internal Medicine

## 2022-11-20 ENCOUNTER — Encounter: Payer: Self-pay | Admitting: Internal Medicine

## 2022-11-20 ENCOUNTER — Telehealth: Payer: Self-pay | Admitting: *Deleted

## 2022-11-20 NOTE — Telephone Encounter (Signed)
Robert Watkins plans to return to work on 8/12. States he needs a letter from our office. Feels he does not have any limitations to be indicated in the letter.

## 2022-11-20 NOTE — Telephone Encounter (Signed)
Scheduled per 07/25 los, patient has been called and notified of upcoming appointments.

## 2022-11-20 NOTE — Telephone Encounter (Signed)
Noag

## 2022-11-28 ENCOUNTER — Encounter: Payer: Self-pay | Admitting: General Practice

## 2022-11-28 NOTE — Progress Notes (Signed)
[facility] Spiritual Care Note  Attempted follow-up call to check in about counselor search as planned. Left voicemail encouraging return call.   [address] Rush B[place], [place], [facility] Pager [phone] Voicemail [phone]

## 2022-12-05 ENCOUNTER — Encounter: Payer: Self-pay | Admitting: *Deleted

## 2022-12-07 ENCOUNTER — Other Ambulatory Visit: Payer: Self-pay

## 2022-12-07 ENCOUNTER — Other Ambulatory Visit: Payer: Self-pay | Admitting: *Deleted

## 2022-12-07 ENCOUNTER — Inpatient Hospital Stay: Payer: Managed Care, Other (non HMO)

## 2022-12-07 ENCOUNTER — Inpatient Hospital Stay (HOSPITAL_BASED_OUTPATIENT_CLINIC_OR_DEPARTMENT_OTHER): Payer: Managed Care, Other (non HMO) | Admitting: Internal Medicine

## 2022-12-07 ENCOUNTER — Inpatient Hospital Stay: Payer: Managed Care, Other (non HMO) | Attending: Internal Medicine

## 2022-12-07 VITALS — BP 118/76 | HR 60 | Temp 97.9°F | Resp 13 | Wt 163.4 lb

## 2022-12-07 VITALS — BP 128/73 | HR 60 | Resp 15

## 2022-12-07 DIAGNOSIS — C719 Malignant neoplasm of brain, unspecified: Secondary | ICD-10-CM

## 2022-12-07 DIAGNOSIS — R5383 Other fatigue: Secondary | ICD-10-CM | POA: Insufficient documentation

## 2022-12-07 DIAGNOSIS — Z79899 Other long term (current) drug therapy: Secondary | ICD-10-CM | POA: Diagnosis not present

## 2022-12-07 DIAGNOSIS — G40909 Epilepsy, unspecified, not intractable, without status epilepticus: Secondary | ICD-10-CM

## 2022-12-07 DIAGNOSIS — Z923 Personal history of irradiation: Secondary | ICD-10-CM | POA: Insufficient documentation

## 2022-12-07 DIAGNOSIS — Z9221 Personal history of antineoplastic chemotherapy: Secondary | ICD-10-CM | POA: Insufficient documentation

## 2022-12-07 DIAGNOSIS — Z5189 Encounter for other specified aftercare: Secondary | ICD-10-CM | POA: Diagnosis not present

## 2022-12-07 DIAGNOSIS — C714 Malignant neoplasm of occipital lobe: Secondary | ICD-10-CM | POA: Insufficient documentation

## 2022-12-07 LAB — CBC WITH DIFFERENTIAL (CANCER CENTER ONLY)
Abs Immature Granulocytes: 0.01 10*3/uL (ref 0.00–0.07)
Basophils Absolute: 0 10*3/uL (ref 0.0–0.1)
Basophils Relative: 1 %
Eosinophils Absolute: 0.1 10*3/uL (ref 0.0–0.5)
Eosinophils Relative: 3 %
Hemoglobin: 15.6 g/dL (ref 13.0–17.0)
Immature Granulocytes: 0 %
Lymphocytes Relative: 23 %
Lymphs Abs: 0.7 10*3/uL (ref 0.7–4.0)
Monocytes Absolute: 0.2 10*3/uL (ref 0.1–1.0)
Monocytes Relative: 6 %
Neutro Abs: 2 10*3/uL (ref 1.7–7.7)
Neutrophils Relative %: 67 %
Platelet Count: 120 10*3/uL — ABNORMAL LOW (ref 150–400)
WBC Count: 2.9 10*3/uL — ABNORMAL LOW (ref 4.0–10.5)
nRBC: 0 % (ref 0.0–0.2)

## 2022-12-07 LAB — TOTAL PROTEIN, URINE DIPSTICK: Protein, ur: NEGATIVE mg/dL

## 2022-12-07 MED ORDER — SODIUM CHLORIDE 0.9 % IV SOLN: Freq: Once | INTRAVENOUS | Status: AC

## 2022-12-07 MED ORDER — SODIUM CHLORIDE 0.9 % IV SOLN
10.0000 mg/kg | Freq: Once | INTRAVENOUS | Status: AC
  Administered 2022-12-07: 800 mg via INTRAVENOUS
  Filled 2022-12-07: qty 32

## 2022-12-07 NOTE — Progress Notes (Signed)
[facility] [address]  [place], [place] [address] [phone]   Interval Evaluation  Date of Service: 12/07/22 Patient Name: Robert Watkins Patient MRN: [id number] Patient DOB: 05/22/1985 Provider: Henreitta Leber, MD  Identifying Statement:  Ghassan Nile is a 37 y.o. 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 Barnett Abu in [place]. 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 Lavonna Monarch. Pathology confirms anaplastic astrocytoma.   06/01/2010 Clinical Event-Other  New patient evaluation at [facility]. Recommend radiation therapy with concurrent Temozolomide followed by twelve cycles of five-day Temozolomide.   06/13/2010 - 08/01/2010 Radiation  Radiation with Temodar.  06/13/2010 - 08/01/2010 Chemotherapy  Temodar 75 mg/m2 with radiation.  08/16/2010 - 08/07/2011 Chemotherapy  5 day Temodar 200 mg/m2  02/19/2018 Progression  Progression of non-enhancing tumor compared to 2013. Recommend initiating metronomic daily Temodar 50 mg/m2/day.  02/12/20 Progression Initiates Tibsovo 500mg  daily  10/21/20 Progression Adds oral CCNU 90mg /m2 q6 weeks to daily Tibsovo  08/06/22 Progression Repeat IMRT with concurrent Avastin 10mg /kg IV q2 weeks  11/02/22  Transitions to avastin monotherapy   Interval History: Marchello Rohal presents today for follow up and avastin infusion.  No new complaints today.  Burden of fatigue is stable.  No headaches or seizures.  He remains active and functionally independent.  Anxiety continues to be a problem.  Medications: Current Outpatient Medications on File Prior to Visit  Medication Sig  Dispense Refill   atomoxetine (STRATTERA) 40 MG capsule Take 40 mg by mouth daily.     clonazePAM (KLONOPIN) 0.5 MG tablet Take 1 tablet (0.5 mg total) by mouth 2 (two) times daily as needed for anxiety. 60 tablet 0   ivosidenib (TIBSOVO) 250 MG tablet Take 2 tablets (500 mg total) by mouth daily. 60 tablet 0   lamoTRIgine (LAMICTAL) 100 MG tablet Take 1 tablet (100 mg total) by mouth daily. 60 tablet 1   No current facility-administered medications on file prior to visit.    Allergies:  No Active Allergies  Past Medical History:  Past Medical History:  Diagnosis Date   Cancer of brain (HCC) 04/07/2011   Depression, reactive 04/07/2011   History of chemotherapy 06/13/2010-08/01/2010   42 day Temodar with concurrent Radiation   History of chemotherapy 08/16/2010-08/07/2011   5 day Temodar 200 mg/m2   History of radiation therapy 06/13/2010-08/01/2010   [facility]. Recommend radiation therapy with concurrent Temozolomide followed by twelve cycles of five-day Temozolomide. ?   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 Lavonna Monarch. Pathology confirms anaplastic astrocytoma. ?   CRANIOTOMY FOR STEREOTACTIC GUIDED SURGERY Left 05/12/2010   Stereotactic biopsy of left occipital lesion by Dr. Barnett Abu in [place]. Pathology demonstrates anaplastic astrocytoma (WHO III)?   Social History:  Social History   Socioeconomic History   Marital status: Single    Spouse name: Not on file   Number of children: Not on file   Years of education: Not on file   Highest education level: Not on file  Occupational History   Not on file  Tobacco Use   Smoking status: Former   Smokeless tobacco: Never  Substance and Sexual Activity    Alcohol use: Yes    Alcohol/week: 4.0 standard drinks of alcohol    Types: 4 Shots of liquor per week   Drug use: Yes    Types: Marijuana   Sexual activity: Yes  Other Topics Concern   Not on file  Social History Narrative   Not on file   Social Determinants of Health   Financial Resource Strain: Not on file  Food Insecurity: No Food Insecurity (08/21/2022)   Hunger Vital Sign    Worried About Running Out of Food in the Last Year: Never true    Ran Out of Food in the Last Year: Never true  Transportation Needs: No Transportation Needs (08/21/2022)   PRAPARE - Administrator, Civil Service (Medical): No    Lack of Transportation (Non-Medical): No  Physical Activity: Not on file  Stress: Not on file  Social Connections: Not on file  Intimate Partner Violence: Not At Risk (08/21/2022)   Humiliation, Afraid, Rape, and Kick questionnaire    Fear of Current or Ex-Partner: No    Emotionally Abused: No    Physically Abused: No    Sexually Abused: No   Family History: No family history on file.  Review of Systems: Constitutional: Denies fevers, chills or abnormal weight loss Eyes: Denies blurriness of vision Ears, nose, mouth, throat, and face: Denies mucositis or sore throat Respiratory: Denies cough, dyspnea or wheezes Cardiovascular: Denies palpitation, chest discomfort or lower extremity swelling Gastrointestinal:  Denies nausea, constipation, diarrhea GU: Denies dysuria or incontinence Skin: Denies abnormal skin rashes Neurological: Per HPI Musculoskeletal: Denies joint pain, back or neck discomfort. No decrease in ROM Behavioral/Psych: Denies anxiety, disturbance in thought content, and mood instability  Physical Exam: Vitals:   12/07/22 0857  BP: 118/76  Pulse: 60  Resp: 13  Temp: 97.9 F (36.6 C)  SpO2: 100%    KPS: 90. General: Alert, cooperative, pleasant, in no acute distress Head: Normal EENT: No conjunctival injection or scleral icterus. Oral  mucosa moist Lungs: Resp effort normal Cardiac: Regular rate and rhythm Abdomen: Soft, non-distended abdomen Skin: No rashes cyanosis or petechiae. Extremities: No clubbing or edema  Neurologic Exam: Mental Status: Awake, alert, attentive to examiner. Oriented to self and environment. Language is fluent with intact comprehension.  Cranial Nerves: Visual acuity is grossly normal. Visual fields are full. Extra-ocular movements intact. No ptosis. Face is symmetric, tongue midline. Motor: Tone and bulk are normal. Power is full in both arms and legs. Reflexes are symmetric, no pathologic reflexes present. Intact finger to nose bilaterally Sensory: Intact to light touch and temperature Gait: Normal and tandem gait is normal.   Labs: I have reviewed the data as listed    Component Value Date/Time   NA 139 09/12/2022 1341   K 4.4 09/12/2022 1341   CL 105 09/12/2022 1341   CO2 29 09/12/2022 1341   GLUCOSE 121 (H) 09/12/2022 1341   BUN 20 09/12/2022 1341   CREATININE 1.19 09/12/2022 1341   CALCIUM 9.5 09/12/2022 1341   PROT 6.7 09/12/2022 1341   ALBUMIN 4.6 09/12/2022 1341   AST 24 09/12/2022 1341   ALT 36 09/12/2022 1341   ALKPHOS 64 09/12/2022 1341   BILITOT 1.0 09/12/2022 1341   GFRNONAA >60 09/12/2022 1341   GFRAA >60 08/19/2018 0854   Lab Results  Component Value Date  WBC 3.1 (L) 11/16/2022   NEUTROABS 2.1 11/16/2022   HGB 17.2 (H) 11/16/2022   HCT RESULTS UNAVAILABLE DUE TO INTERFERING SUBSTANCE 11/16/2022   MCV RESULTS UNAVAILABLE DUE TO INTERFERING SUBSTANCE 11/16/2022   PLT 116 (L) 11/16/2022     Assessment/Plan 1. Anaplastic astrocytoma [facility])  Mr. Rison is clinically stable today.  No new or progresive changes.  Avastin will be continue administered given high risk of radio-inflammatory syndrome.    We ultimately recommended continuing IV avastin 10mg /kg q2-3 weeks.    Avastin should be held for the following:  ANC less than 500  Platelets less than  50,000  LFT or creatinine greater than 2x ULN  If clinical concerns/contraindications develop  Will remain off Tibsovo.  Will con't Lamictal 100mg  daily.  Ok with Klonopin 0.5mg  BID for anxiety.    We appreciate the opportunity to participate in the care of Shady Ake.    We ask that Clearance Christe return to clinic in 3 weeks for MRI brain and planned avastin infusion, or sooner as needed.  All questions were answered. The patient knows to call the clinic with any problems, questions or concerns. No barriers to learning were detected.  I have spent a total of 30 minutes of face-to-face and non-face-to-face time, excluding clinical staff time, preparing to see patient, ordering tests and/or medications, counseling the patient, and independently interpreting results and communicating results to the patient/family/caregiver    Henreitta Leber, MD Medical Director of Neuro-Oncology [facility] 12/07/22 9:00 AM

## 2022-12-07 NOTE — Patient Instructions (Signed)
 [facility]  Discharge Instructions: Thank you for choosing [facility] to provide your oncology and hematology care.   If you have a lab appointment with the Cancer Center, please go directly to the Cancer Center and check in at the registration area.   Wear comfortable clothing and clothing appropriate for easy access to any Portacath or PICC line.   We strive to give you quality time with your provider. You may need to reschedule your appointment if you arrive late (15 or more minutes).  Arriving late affects you and other patients whose appointments are after yours.  Also, if you miss three or more appointments without notifying the office, you may be dismissed from the clinic at the provider's discretion.      For prescription refill requests, have your pharmacy contact our office and allow 72 hours for refills to be completed.    Today you received the following chemotherapy and/or immunotherapy agent: Bevacizumab (Mvasi)   To help prevent nausea and vomiting after your treatment, we encourage you to take your nausea medication as directed.  BELOW ARE SYMPTOMS THAT SHOULD BE REPORTED IMMEDIATELY: *FEVER GREATER THAN 100.4 F (38 C) OR HIGHER *CHILLS OR SWEATING *NAUSEA AND VOMITING THAT IS NOT CONTROLLED WITH YOUR NAUSEA MEDICATION *UNUSUAL SHORTNESS OF BREATH *UNUSUAL BRUISING OR BLEEDING *URINARY PROBLEMS (pain or burning when urinating, or frequent urination) *BOWEL PROBLEMS (unusual diarrhea, constipation, pain near the anus) TENDERNESS IN MOUTH AND THROAT WITH OR WITHOUT PRESENCE OF ULCERS (sore throat, sores in mouth, or a toothache) UNUSUAL RASH, SWELLING OR PAIN  UNUSUAL VAGINAL DISCHARGE OR ITCHING   Items with * indicate a potential emergency and should be followed up as soon as possible or go to the Emergency Department if any problems should occur.  Please show the CHEMOTHERAPY ALERT CARD or IMMUNOTHERAPY ALERT CARD at  check-in to the Emergency Department and triage nurse.  Should you have questions after your visit or need to cancel or reschedule your appointment, please contact [facility]  Dept: [phone]  and follow the prompts.  Office hours are 8:00 a.m. to 4:30 p.m. Monday - Friday. Please note that voicemails left after 4:00 p.m. may not be returned until the following business day.  We are closed weekends and major holidays. You have access to a nurse at all times for urgent questions. Please call the main number to the clinic Dept: [phone] and follow the prompts.   For any non-urgent questions, you may also contact your provider using MyChart. We now offer e-Visits for anyone 72 and older to request care online for non-urgent symptoms. For details visit [email or web address].   Also download the MyChart app! Go to the app store, search "MyChart", open the app, select [place], and log in with your MyChart username and password. Bevacizumab Injection What is this medication? BEVACIZUMAB (be va SIZ yoo mab) treats some types of cancer. It works by blocking a protein that causes cancer cells to grow and multiply. This helps to slow or stop the spread of cancer cells. It is a monoclonal antibody. This medicine may be used for other purposes; ask your health care provider or pharmacist if you have questions. COMMON BRAND NAME(S): Alymsys, Avastin, MVASI, Omer Jack What should I tell my care team before I take this medication? They need to know if you have any of these conditions: Blood clots Coughing up blood Having or recent surgery Heart failure High blood  pressure History of a connection between 2 or more body parts that do not usually connect (fistula) History of a tear in your stomach or intestines Protein in your urine An unusual or allergic reaction to bevacizumab, other medications, foods, dyes, or preservatives Pregnant or trying to get  pregnant Breast-feeding How should I use this medication? This medication is injected into a vein. It is given by your care team in a hospital or clinic setting. Talk to your care team the use of this medication in children. Special care may be needed. Overdosage: If you think you have taken too much of this medicine contact a poison control center or emergency room at once. NOTE: This medicine is only for you. Do not share this medicine with others. What if I miss a dose? Keep appointments for follow-up doses. It is important not to miss your dose. Call your care team if you are unable to keep an appointment. What may interact with this medication? Interactions are not expected. This list may not describe all possible interactions. Give your health care provider a list of all the medicines, herbs, non-prescription drugs, or dietary supplements you use. Also tell them if you smoke, drink alcohol, or use illegal drugs. Some items may interact with your medicine. What should I watch for while using this medication? Your condition will be monitored carefully while you are receiving this medication. You may need blood work while taking this medication. This medication may make you feel generally unwell. This is not uncommon as chemotherapy can affect healthy cells as well as cancer cells. Report any side effects. Continue your course of treatment even though you feel ill unless your care team tells you to stop. This medication may increase your risk to bruise or bleed. Call your care team if you notice any unusual bleeding. Before having surgery, talk to your care team to make sure it is ok. This medication can increase the risk of poor healing of your surgical site or wound. You will need to stop this medication for 28 days before surgery. After surgery, wait at least 28 days before restarting this medication. Make sure the surgical site or wound is healed enough before restarting this medication. Talk  to your care team if questions. Talk to your care team if you may be pregnant. Serious birth defects can occur if you take this medication during pregnancy and for 6 months after the last dose. Contraception is recommended while taking this medication and for 6 months after the last dose. Your care team can help you find the option that works for you. Do not breastfeed while taking this medication and for 6 months after the last dose. This medication can cause infertility. Talk to your care team if you are concerned about your fertility. What side effects may I notice from receiving this medication? Side effects that you should report to your care team as soon as possible: Allergic reactions--skin rash, itching, hives, swelling of the face, lips, tongue, or throat Bleeding--bloody or black, tar-like stools, vomiting blood or brown material that looks like coffee grounds, red or dark brown urine, small red or purple spots on skin, unusual bruising or bleeding Blood clot--pain, swelling, or warmth in the leg, shortness of breath, chest pain Heart attack--pain or tightness in the chest, shoulders, arms, or jaw, nausea, shortness of breath, cold or clammy skin, feeling faint or lightheaded Heart failure--shortness of breath, swelling of the ankles, feet, or hands, sudden weight gain, unusual weakness or fatigue Increase in  blood pressure Infection--fever, chills, cough, sore throat, wounds that don't heal, pain or trouble when passing urine, general feeling of discomfort or being unwell Infusion reactions--chest pain, shortness of breath or trouble breathing, feeling faint or lightheaded Kidney injury--decrease in the amount of urine, swelling of the ankles, hands, or feet Stomach pain that is severe, does not go away, or gets worse Stroke--sudden numbness or weakness of the face, arm, or leg, trouble speaking, confusion, trouble walking, loss of balance or coordination, dizziness, severe headache,  change in vision Sudden and severe headache, confusion, change in vision, seizures, which may be signs of posterior reversible encephalopathy syndrome (PRES) Side effects that usually do not require medical attention (report to your care team if they continue or are bothersome): Back pain Change in taste Diarrhea Dry skin Increased tears Nosebleed This list may not describe all possible side effects. Call your doctor for medical advice about side effects. You may report side effects to FDA at 1-800-FDA-1088. Where should I keep my medication? This medication is given in a hospital or clinic. It will not be stored at home. NOTE: This sheet is a summary. It may not cover all possible information. If you have questions about this medicine, talk to your doctor, pharmacist, or health care provider.  2024 Elsevier/Gold Standard (2021-08-26 00:00:00)

## 2022-12-09 ENCOUNTER — Other Ambulatory Visit: Payer: Self-pay

## 2022-12-18 ENCOUNTER — Other Ambulatory Visit: Payer: Self-pay | Admitting: Internal Medicine

## 2022-12-18 DIAGNOSIS — G40909 Epilepsy, unspecified, not intractable, without status epilepticus: Secondary | ICD-10-CM

## 2022-12-19 ENCOUNTER — Telehealth: Payer: Self-pay | Admitting: *Deleted

## 2022-12-19 ENCOUNTER — Encounter: Payer: Self-pay | Admitting: Internal Medicine

## 2022-12-19 ENCOUNTER — Telehealth: Payer: Self-pay | Admitting: Internal Medicine

## 2022-12-19 NOTE — Telephone Encounter (Signed)
Returned PC to patient, no answer, left VM - he called earlier stating that he has a MRI scheduled on 12/29/22.  He has lab/MD/infusion appointments on 12/28/22 & wants to know if this ok.  Per Dr Barbaraann Cao, he wants to see the patient the following week for these appointments to discuss MRI results.  Informed patient scheduling department will contact him to reschedule, scheduling message sent.

## 2022-12-19 NOTE — Telephone Encounter (Signed)
Called patient regarding September appointments, left a voicemail.

## 2022-12-22 ENCOUNTER — Other Ambulatory Visit: Payer: Self-pay

## 2022-12-25 ENCOUNTER — Other Ambulatory Visit: Payer: Self-pay

## 2022-12-28 ENCOUNTER — Other Ambulatory Visit: Payer: Managed Care, Other (non HMO)

## 2022-12-28 ENCOUNTER — Ambulatory Visit: Payer: Managed Care, Other (non HMO) | Admitting: Internal Medicine

## 2022-12-28 ENCOUNTER — Ambulatory Visit: Payer: Managed Care, Other (non HMO)

## 2022-12-29 ENCOUNTER — Encounter: Payer: Self-pay | Admitting: Internal Medicine

## 2022-12-29 ENCOUNTER — Ambulatory Visit
Admission: RE | Admit: 2022-12-29 | Discharge: 2022-12-29 | Disposition: A | Discharge status: Home / Self Care | Payer: Managed Care, Other (non HMO) | Source: Ambulatory Visit | Attending: Internal Medicine | Admitting: Internal Medicine

## 2022-12-29 DIAGNOSIS — C719 Malignant neoplasm of brain, unspecified: Secondary | ICD-10-CM

## 2022-12-29 MED ORDER — GADOPICLENOL 0.5 MMOL/ML IV SOLN
7.5000 mL | Freq: Once | INTRAVENOUS | Status: AC | PRN
  Administered 2022-12-29: 7 mL via INTRAVENOUS

## 2023-01-03 ENCOUNTER — Other Ambulatory Visit: Payer: Self-pay | Admitting: *Deleted

## 2023-01-03 DIAGNOSIS — C719 Malignant neoplasm of brain, unspecified: Secondary | ICD-10-CM

## 2023-01-04 ENCOUNTER — Inpatient Hospital Stay: Payer: Managed Care, Other (non HMO)

## 2023-01-04 ENCOUNTER — Inpatient Hospital Stay (HOSPITAL_BASED_OUTPATIENT_CLINIC_OR_DEPARTMENT_OTHER): Payer: Managed Care, Other (non HMO) | Admitting: Internal Medicine

## 2023-01-04 ENCOUNTER — Inpatient Hospital Stay: Payer: Managed Care, Other (non HMO) | Attending: Internal Medicine

## 2023-01-04 VITALS — BP 122/82 | HR 79 | Temp 98.1°F | Resp 20 | Wt 164.2 lb

## 2023-01-04 VITALS — BP 118/77 | HR 80 | Resp 16

## 2023-01-04 DIAGNOSIS — C714 Malignant neoplasm of occipital lobe: Secondary | ICD-10-CM | POA: Insufficient documentation

## 2023-01-04 DIAGNOSIS — C719 Malignant neoplasm of brain, unspecified: Secondary | ICD-10-CM | POA: Diagnosis not present

## 2023-01-04 DIAGNOSIS — Z5112 Encounter for antineoplastic immunotherapy: Secondary | ICD-10-CM | POA: Insufficient documentation

## 2023-01-04 LAB — CBC WITH DIFFERENTIAL (CANCER CENTER ONLY)
Abs Immature Granulocytes: 0.01 10*3/uL (ref 0.00–0.07)
Basophils Absolute: 0 10*3/uL (ref 0.0–0.1)
Basophils Relative: 0 %
Eosinophils Absolute: 0.1 10*3/uL (ref 0.0–0.5)
Eosinophils Relative: 2 %
HCT: 40.6 % (ref 39.0–52.0)
Hemoglobin: 15 g/dL (ref 13.0–17.0)
Immature Granulocytes: 0 %
Lymphocytes Relative: 20 %
Lymphs Abs: 0.6 10*3/uL — ABNORMAL LOW (ref 0.7–4.0)
MCH: 35.4 pg — ABNORMAL HIGH (ref 26.0–34.0)
MCHC: 36.9 g/dL — ABNORMAL HIGH (ref 30.0–36.0)
MCV: 95.8 fL (ref 80.0–100.0)
Monocytes Absolute: 0.2 10*3/uL (ref 0.1–1.0)
Monocytes Relative: 6 %
Neutro Abs: 2.3 10*3/uL (ref 1.7–7.7)
Neutrophils Relative %: 72 %
Platelet Count: 124 10*3/uL — ABNORMAL LOW (ref 150–400)
RBC: 4.24 MIL/uL (ref 4.22–5.81)
RDW: 11.7 % (ref 11.5–15.5)
WBC Count: 3.2 10*3/uL — ABNORMAL LOW (ref 4.0–10.5)
nRBC: 0 % (ref 0.0–0.2)

## 2023-01-04 LAB — TOTAL PROTEIN, URINE DIPSTICK: Protein, ur: NEGATIVE mg/dL

## 2023-01-04 MED ORDER — SODIUM CHLORIDE 0.9 % IV SOLN
10.0000 mg/kg | Freq: Once | INTRAVENOUS | Status: AC
  Administered 2023-01-04: 800 mg via INTRAVENOUS
  Filled 2023-01-04: qty 32

## 2023-01-04 MED ORDER — SODIUM CHLORIDE 0.9 % IV SOLN: Freq: Once | INTRAVENOUS | Status: AC

## 2023-01-04 NOTE — Patient Instructions (Signed)
 [facility]  Discharge Instructions: Thank you for choosing [facility] to provide your oncology and hematology care.   If you have a lab appointment with the Cancer Center, please go directly to the Cancer Center and check in at the registration area.   Wear comfortable clothing and clothing appropriate for easy access to any Portacath or PICC line.   We strive to give you quality time with your provider. You may need to reschedule your appointment if you arrive late (15 or more minutes).  Arriving late affects you and other patients whose appointments are after yours.  Also, if you miss three or more appointments without notifying the office, you may be dismissed from the clinic at the provider's discretion.      For prescription refill requests, have your pharmacy contact our office and allow 72 hours for refills to be completed.    Today you received the following chemotherapy and/or immunotherapy agent: Bevacizumab (Mvasi)   To help prevent nausea and vomiting after your treatment, we encourage you to take your nausea medication as directed.  BELOW ARE SYMPTOMS THAT SHOULD BE REPORTED IMMEDIATELY: *FEVER GREATER THAN 100.4 F (38 C) OR HIGHER *CHILLS OR SWEATING *NAUSEA AND VOMITING THAT IS NOT CONTROLLED WITH YOUR NAUSEA MEDICATION *UNUSUAL SHORTNESS OF BREATH *UNUSUAL BRUISING OR BLEEDING *URINARY PROBLEMS (pain or burning when urinating, or frequent urination) *BOWEL PROBLEMS (unusual diarrhea, constipation, pain near the anus) TENDERNESS IN MOUTH AND THROAT WITH OR WITHOUT PRESENCE OF ULCERS (sore throat, sores in mouth, or a toothache) UNUSUAL RASH, SWELLING OR PAIN  UNUSUAL VAGINAL DISCHARGE OR ITCHING   Items with * indicate a potential emergency and should be followed up as soon as possible or go to the Emergency Department if any problems should occur.  Please show the CHEMOTHERAPY ALERT CARD or IMMUNOTHERAPY ALERT CARD at  check-in to the Emergency Department and triage nurse.  Should you have questions after your visit or need to cancel or reschedule your appointment, please contact [facility]  Dept: [phone]  and follow the prompts.  Office hours are 8:00 a.m. to 4:30 p.m. Monday - Friday. Please note that voicemails left after 4:00 p.m. may not be returned until the following business day.  We are closed weekends and major holidays. You have access to a nurse at all times for urgent questions. Please call the main number to the clinic Dept: [phone] and follow the prompts.   For any non-urgent questions, you may also contact your provider using MyChart. We now offer e-Visits for anyone 72 and older to request care online for non-urgent symptoms. For details visit [email or web address].   Also download the MyChart app! Go to the app store, search "MyChart", open the app, select [place], and log in with your MyChart username and password. Bevacizumab Injection What is this medication? BEVACIZUMAB (be va SIZ yoo mab) treats some types of cancer. It works by blocking a protein that causes cancer cells to grow and multiply. This helps to slow or stop the spread of cancer cells. It is a monoclonal antibody. This medicine may be used for other purposes; ask your health care provider or pharmacist if you have questions. COMMON BRAND NAME(S): Alymsys, Avastin, MVASI, Omer Jack What should I tell my care team before I take this medication? They need to know if you have any of these conditions: Blood clots Coughing up blood Having or recent surgery Heart failure High blood  pressure History of a connection between 2 or more body parts that do not usually connect (fistula) History of a tear in your stomach or intestines Protein in your urine An unusual or allergic reaction to bevacizumab, other medications, foods, dyes, or preservatives Pregnant or trying to get  pregnant Breast-feeding How should I use this medication? This medication is injected into a vein. It is given by your care team in a hospital or clinic setting. Talk to your care team the use of this medication in children. Special care may be needed. Overdosage: If you think you have taken too much of this medicine contact a poison control center or emergency room at once. NOTE: This medicine is only for you. Do not share this medicine with others. What if I miss a dose? Keep appointments for follow-up doses. It is important not to miss your dose. Call your care team if you are unable to keep an appointment. What may interact with this medication? Interactions are not expected. This list may not describe all possible interactions. Give your health care provider a list of all the medicines, herbs, non-prescription drugs, or dietary supplements you use. Also tell them if you smoke, drink alcohol, or use illegal drugs. Some items may interact with your medicine. What should I watch for while using this medication? Your condition will be monitored carefully while you are receiving this medication. You may need blood work while taking this medication. This medication may make you feel generally unwell. This is not uncommon as chemotherapy can affect healthy cells as well as cancer cells. Report any side effects. Continue your course of treatment even though you feel ill unless your care team tells you to stop. This medication may increase your risk to bruise or bleed. Call your care team if you notice any unusual bleeding. Before having surgery, talk to your care team to make sure it is ok. This medication can increase the risk of poor healing of your surgical site or wound. You will need to stop this medication for 28 days before surgery. After surgery, wait at least 28 days before restarting this medication. Make sure the surgical site or wound is healed enough before restarting this medication. Talk  to your care team if questions. Talk to your care team if you may be pregnant. Serious birth defects can occur if you take this medication during pregnancy and for 6 months after the last dose. Contraception is recommended while taking this medication and for 6 months after the last dose. Your care team can help you find the option that works for you. Do not breastfeed while taking this medication and for 6 months after the last dose. This medication can cause infertility. Talk to your care team if you are concerned about your fertility. What side effects may I notice from receiving this medication? Side effects that you should report to your care team as soon as possible: Allergic reactions--skin rash, itching, hives, swelling of the face, lips, tongue, or throat Bleeding--bloody or black, tar-like stools, vomiting blood or brown material that looks like coffee grounds, red or dark brown urine, small red or purple spots on skin, unusual bruising or bleeding Blood clot--pain, swelling, or warmth in the leg, shortness of breath, chest pain Heart attack--pain or tightness in the chest, shoulders, arms, or jaw, nausea, shortness of breath, cold or clammy skin, feeling faint or lightheaded Heart failure--shortness of breath, swelling of the ankles, feet, or hands, sudden weight gain, unusual weakness or fatigue Increase in  blood pressure Infection--fever, chills, cough, sore throat, wounds that don't heal, pain or trouble when passing urine, general feeling of discomfort or being unwell Infusion reactions--chest pain, shortness of breath or trouble breathing, feeling faint or lightheaded Kidney injury--decrease in the amount of urine, swelling of the ankles, hands, or feet Stomach pain that is severe, does not go away, or gets worse Stroke--sudden numbness or weakness of the face, arm, or leg, trouble speaking, confusion, trouble walking, loss of balance or coordination, dizziness, severe headache,  change in vision Sudden and severe headache, confusion, change in vision, seizures, which may be signs of posterior reversible encephalopathy syndrome (PRES) Side effects that usually do not require medical attention (report to your care team if they continue or are bothersome): Back pain Change in taste Diarrhea Dry skin Increased tears Nosebleed This list may not describe all possible side effects. Call your doctor for medical advice about side effects. You may report side effects to FDA at 1-800-FDA-1088. Where should I keep my medication? This medication is given in a hospital or clinic. It will not be stored at home. NOTE: This sheet is a summary. It may not cover all possible information. If you have questions about this medicine, talk to your doctor, pharmacist, or health care provider.  2024 Elsevier/Gold Standard (2021-08-26 00:00:00)

## 2023-01-04 NOTE — Progress Notes (Signed)
[facility] at [facility] [address]  [place], [place] [address] [phone]   Interval Evaluation  Date of Service: 01/04/23 Patient Name: Robert Watkins Patient MRN: [id number] Patient DOB: 07/02/1985 Provider: Henreitta Leber, MD  Identifying Statement:  Robert Watkins is a 37 y.o. 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 Barnett Abu in [place]. 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 Lavonna Monarch. Pathology confirms anaplastic astrocytoma.   06/01/2010 Clinical Event-Other  New patient evaluation at [facility] at [facility]. Recommend radiation therapy with concurrent Temozolomide followed by twelve cycles of five-day Temozolomide.   06/13/2010 - 08/01/2010 Radiation  Radiation with Temodar.  06/13/2010 - 08/01/2010 Chemotherapy  Temodar 75 mg/m2 with radiation.  08/16/2010 - 08/07/2011 Chemotherapy  5 day Temodar 200 mg/m2  02/19/2018 Progression  Progression of non-enhancing tumor compared to 2013. Recommend initiating metronomic daily Temodar 50 mg/m2/day.  02/12/20 Progression Initiates Tibsovo 500mg  daily  10/21/20 Progression Adds oral CCNU 90mg /m2 q6 weeks to daily Tibsovo  08/06/22 Progression Repeat IMRT with concurrent Avastin 10mg /kg IV q2 weeks  11/02/22  Transitions to avastin monotherapy   Interval History: Robert Watkins presents today for follow up and avastin infusion.  No new or progressive changes.  Burden of fatigue is stable.  No headaches or seizures.  He remains active and functionally independent.  Anxiety continues to be a problem.  Medications: Current Outpatient Medications on File Prior to Visit  Medication Sig  Dispense Refill   atomoxetine (STRATTERA) 40 MG capsule Take 40 mg by mouth daily.     clonazePAM (KLONOPIN) 0.5 MG tablet Take 1 tablet (0.5 mg total) by mouth 2 (two) times daily as needed for anxiety. 60 tablet 0   ivosidenib (TIBSOVO) 250 MG tablet Take 2 tablets (500 mg total) by mouth daily. (Patient not taking: Reported on 01/04/2023) 60 tablet 0   lamoTRIgine (LAMICTAL) 100 MG tablet Take 1 tablet by mouth once daily 60 tablet 0   No current facility-administered medications on file prior to visit.    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 Recommend radiation therapy with concurrent Temozolomide followed by twelve cycles of five-day Temozolomide. ?   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 Lavonna Monarch. Pathology confirms anaplastic astrocytoma. ?   CRANIOTOMY FOR STEREOTACTIC GUIDED SURGERY Left 05/12/2010   Stereotactic biopsy of left occipital lesion by Dr. Barnett Abu in [place]. Pathology demonstrates anaplastic astrocytoma (WHO III)?   Social History:  Social History   Socioeconomic History   Marital status: Single    Spouse name: Not on file   Number of children: Not on file   Years of education: Not on file   Highest education  level: Not on file  Occupational History   Not on file  Tobacco Use   Smoking status: Former   Smokeless tobacco: Never   Substance and Sexual Activity   Alcohol use: Yes    Alcohol/week: 4.0 standard drinks of alcohol    Types: 4 Shots of liquor per week   Drug use: Yes    Types: Marijuana   Sexual activity: Yes  Other Topics Concern   Not on file  Social History Narrative   Not on file   Social Determinants of Health   Financial Resource Strain: Not on file  Food Insecurity: No Food Insecurity (08/21/2022)   Hunger Vital Sign    Worried About Running Out of Food in the Last Year: Never true    Ran Out of Food in the Last Year: Never true  Transportation Needs: No Transportation Needs (08/21/2022)   PRAPARE - Administrator, Civil Service (Medical): No    Lack of Transportation (Non-Medical): No  Physical Activity: Not on file  Stress: Not on file  Social Connections: Not on file  Intimate Partner Violence: Not At Risk (08/21/2022)   Humiliation, Afraid, Rape, and Kick questionnaire    Fear of Current or Ex-Partner: No    Emotionally Abused: No    Physically Abused: No    Sexually Abused: No   Family History: No family history on file.  Review of Systems: Constitutional: Denies fevers, chills or abnormal weight loss Eyes: Denies blurriness of vision Ears, nose, mouth, throat, and face: Denies mucositis or sore throat Respiratory: Denies cough, dyspnea or wheezes Cardiovascular: Denies palpitation, chest discomfort or lower extremity swelling Gastrointestinal:  Denies nausea, constipation, diarrhea GU: Denies dysuria or incontinence Skin: Denies abnormal skin rashes Neurological: Per HPI Musculoskeletal: Denies joint pain, back or neck discomfort. No decrease in ROM Behavioral/Psych: Denies anxiety, disturbance in thought content, and mood instability  Physical Exam: Vitals:   01/04/23 1220  BP: 122/82  Pulse: 79  Resp: 20  Temp: 98.1 F (36.7 C)  SpO2: 98%    KPS: 90. General: Alert, cooperative, pleasant, in no acute distress Head: Normal EENT: No conjunctival  injection or scleral icterus. Oral mucosa moist Lungs: Resp effort normal Cardiac: Regular rate and rhythm Abdomen: Soft, non-distended abdomen Skin: No rashes cyanosis or petechiae. Extremities: No clubbing or edema  Neurologic Exam: Mental Status: Awake, alert, attentive to examiner. Oriented to self and environment. Language is fluent with intact comprehension.  Cranial Nerves: Visual acuity is grossly normal. Visual fields are full. Extra-ocular movements intact. No ptosis. Face is symmetric, tongue midline. Motor: Tone and bulk are normal. Power is full in both arms and legs. Reflexes are symmetric, no pathologic reflexes present. Intact finger to nose bilaterally Sensory: Intact to light touch and temperature Gait: Normal and tandem gait is normal.   Labs: I have reviewed the data as listed    Component Value Date/Time   NA 139 09/12/2022 1341   K 4.4 09/12/2022 1341   CL 105 09/12/2022 1341   CO2 29 09/12/2022 1341   GLUCOSE 121 (H) 09/12/2022 1341   BUN 20 09/12/2022 1341   CREATININE 1.19 09/12/2022 1341   CALCIUM 9.5 09/12/2022 1341   PROT 6.7 09/12/2022 1341   ALBUMIN 4.6 09/12/2022 1341   AST 24 09/12/2022 1341   ALT 36 09/12/2022 1341   ALKPHOS 64 09/12/2022 1341   BILITOT 1.0 09/12/2022 1341   GFRNONAA >60 09/12/2022 1341   GFRAA >60 08/19/2018 0854   Lab Results  Component Value Date   WBC 3.2 (L) 01/04/2023   NEUTROABS 2.3 01/04/2023   HGB 15.0 01/04/2023   HCT 40.6 01/04/2023   MCV 95.8 01/04/2023   PLT 124 (L) 01/04/2023    Imaging:  [facility] Clinician Interpretation: I have personally reviewed the CNS images as listed.  My interpretation, in the context of the patient's clinical presentation, is stable disease pending official read  No results found.   Assessment/Plan 1. Anaplastic astrocytoma [facility])  Mr. Ma is clinically stable today.  No new or progresive changes.  MRI brain demonstrates stable findings, pending official read.  Avastin  will be continue administered given high risk of radio-inflammatory syndrome.    We ultimately recommended continuing IV avastin 10mg /kg, transition to q4 weeks.    Avastin should be held for the following:  ANC less than 500  Platelets less than 50,000  LFT or creatinine greater than 2x ULN  If clinical concerns/contraindications develop  Will remain off Tibsovo.  Will con't Lamictal 100mg  daily.  Ok with Klonopin 0.5mg  BID for anxiety.    We appreciate the opportunity to participate in the care of Veron Posso.    We ask that Robt Gunnell return to clinic in 4 weeks for avastin infusion, or sooner as needed.  All questions were answered. The patient knows to call the clinic with any problems, questions or concerns. No barriers to learning were detected.  I have spent a total of 40 minutes of face-to-face and non-face-to-face time, excluding clinical staff time, preparing to see patient, ordering tests and/or medications, counseling the patient, and independently interpreting results and communicating results to the patient/family/caregiver    Henreitta Leber, MD Medical Director of Neuro-Oncology [facility] at [facility] 01/04/23 1:27 PM

## 2023-01-09 ENCOUNTER — Other Ambulatory Visit: Payer: Self-pay

## 2023-01-16 ENCOUNTER — Other Ambulatory Visit: Payer: Self-pay | Admitting: Internal Medicine

## 2023-01-16 ENCOUNTER — Encounter: Payer: Self-pay | Admitting: Internal Medicine

## 2023-01-16 DIAGNOSIS — C719 Malignant neoplasm of brain, unspecified: Secondary | ICD-10-CM

## 2023-01-18 ENCOUNTER — Other Ambulatory Visit: Payer: Self-pay

## 2023-01-23 ENCOUNTER — Other Ambulatory Visit: Payer: Self-pay | Admitting: Internal Medicine

## 2023-01-23 ENCOUNTER — Telehealth: Payer: Self-pay | Admitting: Internal Medicine

## 2023-01-23 NOTE — Telephone Encounter (Signed)
Rescheduled 10/10 appointment time, called and left a voicemail for patient.

## 2023-01-23 NOTE — Telephone Encounter (Signed)
Called patient regarding October appointments, left a voicemail regarding upcoming appointments.

## 2023-02-01 ENCOUNTER — Other Ambulatory Visit: Payer: Managed Care, Other (non HMO)

## 2023-02-01 ENCOUNTER — Inpatient Hospital Stay: Payer: Managed Care, Other (non HMO) | Attending: Internal Medicine

## 2023-02-01 ENCOUNTER — Inpatient Hospital Stay: Payer: Managed Care, Other (non HMO)

## 2023-02-01 ENCOUNTER — Inpatient Hospital Stay (HOSPITAL_BASED_OUTPATIENT_CLINIC_OR_DEPARTMENT_OTHER): Payer: Managed Care, Other (non HMO) | Admitting: Internal Medicine

## 2023-02-01 ENCOUNTER — Ambulatory Visit: Payer: Managed Care, Other (non HMO) | Admitting: Internal Medicine

## 2023-02-01 ENCOUNTER — Ambulatory Visit: Payer: Managed Care, Other (non HMO)

## 2023-02-01 VITALS — BP 119/74 | HR 77 | Temp 98.2°F | Resp 17 | Ht 71.0 in | Wt 163.3 lb

## 2023-02-01 DIAGNOSIS — C719 Malignant neoplasm of brain, unspecified: Secondary | ICD-10-CM

## 2023-02-01 DIAGNOSIS — Z923 Personal history of irradiation: Secondary | ICD-10-CM | POA: Insufficient documentation

## 2023-02-01 DIAGNOSIS — Z5111 Encounter for antineoplastic chemotherapy: Secondary | ICD-10-CM | POA: Diagnosis not present

## 2023-02-01 DIAGNOSIS — Z9221 Personal history of antineoplastic chemotherapy: Secondary | ICD-10-CM | POA: Insufficient documentation

## 2023-02-01 DIAGNOSIS — Z79899 Other long term (current) drug therapy: Secondary | ICD-10-CM | POA: Diagnosis not present

## 2023-02-01 DIAGNOSIS — G40909 Epilepsy, unspecified, not intractable, without status epilepticus: Secondary | ICD-10-CM

## 2023-02-01 DIAGNOSIS — C714 Malignant neoplasm of occipital lobe: Secondary | ICD-10-CM | POA: Insufficient documentation

## 2023-02-01 DIAGNOSIS — Z87891 Personal history of nicotine dependence: Secondary | ICD-10-CM | POA: Diagnosis not present

## 2023-02-01 LAB — CBC WITH DIFFERENTIAL (CANCER CENTER ONLY)
Abs Immature Granulocytes: 0.01 10*3/uL (ref 0.00–0.07)
Basophils Absolute: 0 10*3/uL (ref 0.0–0.1)
Basophils Relative: 1 %
Eosinophils Absolute: 0.1 10*3/uL (ref 0.0–0.5)
Eosinophils Relative: 2 %
HCT: 46.5 % (ref 39.0–52.0)
Hemoglobin: 16.6 g/dL (ref 13.0–17.0)
Immature Granulocytes: 0 %
Lymphocytes Relative: 22 %
Lymphs Abs: 0.7 10*3/uL (ref 0.7–4.0)
MCH: 35.6 pg — ABNORMAL HIGH (ref 26.0–34.0)
MCHC: 35.7 g/dL (ref 30.0–36.0)
MCV: 99.8 fL (ref 80.0–100.0)
Monocytes Absolute: 0.2 10*3/uL (ref 0.1–1.0)
Monocytes Relative: 7 %
Neutro Abs: 2.3 10*3/uL (ref 1.7–7.7)
Neutrophils Relative %: 68 %
Platelet Count: 114 10*3/uL — ABNORMAL LOW (ref 150–400)
RBC: 4.66 MIL/uL (ref 4.22–5.81)
RDW: 11.8 % (ref 11.5–15.5)
WBC Count: 3.4 10*3/uL — ABNORMAL LOW (ref 4.0–10.5)
nRBC: 0 % (ref 0.0–0.2)

## 2023-02-01 LAB — TOTAL PROTEIN, URINE DIPSTICK: Protein, ur: NEGATIVE mg/dL

## 2023-02-01 MED ORDER — SODIUM CHLORIDE 0.9 % IV SOLN: Freq: Once | INTRAVENOUS | Status: AC

## 2023-02-01 MED ORDER — SODIUM CHLORIDE 0.9 % IV SOLN
10.0000 mg/kg | Freq: Once | INTRAVENOUS | Status: AC
  Administered 2023-02-01: 800 mg via INTRAVENOUS
  Filled 2023-02-01: qty 32

## 2023-02-01 NOTE — Progress Notes (Signed)
[facility] at [facility] [address]  [place], [place] [address] [phone]   Interval Evaluation  Date of Service: 02/01/23 Patient Name: Robert Watkins Patient MRN: [id number] Patient DOB: May 19, 1985 Provider: Henreitta Leber, MD  Identifying Statement:  Robert Watkins is a 37 y.o. 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 Barnett Abu in [place]. 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 Lavonna Monarch. Pathology confirms anaplastic astrocytoma.   06/01/2010 Clinical Event-Other  New patient evaluation at [facility]. Recommend radiation therapy with concurrent Temozolomide followed by twelve cycles of five-day Temozolomide.   06/13/2010 - 08/01/2010 Radiation  Radiation with Temodar.  06/13/2010 - 08/01/2010 Chemotherapy  Temodar 75 mg/m2 with radiation.  08/16/2010 - 08/07/2011 Chemotherapy  5 day Temodar 200 mg/m2  02/19/2018 Progression  Progression of non-enhancing tumor compared to 2013. Recommend initiating metronomic daily Temodar 50 mg/m2/day.  02/12/20 Progression Initiates Tibsovo 500mg  daily  10/21/20 Progression Adds oral CCNU 90mg /m2 q6 weeks to daily Tibsovo  08/06/22 Progression Repeat IMRT with concurrent Avastin 10mg /kg IV q2 weeks  11/02/22  Transitions to avastin monotherapy   Interval History: Robert Watkins presents today for follow up and avastin infusion.  No changes reported today.  Burden of fatigue is stable.  No headaches or seizures.  He remains active and functionally independent.  Anxiety continues to be a problem.  Medications: Current Outpatient Medications on File Prior to Visit  Medication Sig  Dispense Refill   atomoxetine (STRATTERA) 40 MG capsule Take 40 mg by mouth daily.     clonazePAM (KLONOPIN) 0.5 MG tablet Take 1 tablet (0.5 mg total) by mouth 2 (two) times daily as needed for anxiety. 60 tablet 0   ivosidenib (TIBSOVO) 250 MG tablet Take 2 tablets (500 mg total) by mouth daily. (Patient not taking: Reported on 01/04/2023) 60 tablet 0   lamoTRIgine (LAMICTAL) 100 MG tablet Take 1 tablet by mouth once daily 60 tablet 0   No current facility-administered medications on file prior to visit.    Allergies:  No Active Allergies  Past Medical History:  Past Medical History:  Diagnosis Date   Cancer of brain (HCC) 04/07/2011   Depression, reactive 04/07/2011   History of chemotherapy 06/13/2010-08/01/2010   42 day Temodar with concurrent Radiation   History of chemotherapy 08/16/2010-08/07/2011   5 day Temodar 200 mg/m2   History of radiation therapy 06/13/2010-08/01/2010   [facility]. Recommend radiation therapy with concurrent Temozolomide followed by twelve cycles of five-day Temozolomide. ?   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 Lavonna Monarch. Pathology confirms anaplastic astrocytoma. ?   CRANIOTOMY FOR STEREOTACTIC GUIDED SURGERY Left 05/12/2010   Stereotactic biopsy of left occipital lesion by Dr. Barnett Abu in [place]. Pathology demonstrates anaplastic astrocytoma (WHO III)?   Social History:  Social History   Socioeconomic History   Marital status: Single    Spouse name: Not on file   Number of children: Not on file   Years of education: Not on file   Highest education level:  Not on file  Occupational History   Not on file  Tobacco Use   Smoking status: Former   Smokeless tobacco: Never   Substance and Sexual Activity   Alcohol use: Yes    Alcohol/week: 4.0 standard drinks of alcohol    Types: 4 Shots of liquor per week   Drug use: Yes    Types: Marijuana   Sexual activity: Yes  Other Topics Concern   Not on file  Social History Narrative   Not on file   Social Determinants of Health   Financial Resource Strain: Not on file  Food Insecurity: No Food Insecurity (08/21/2022)   Hunger Vital Sign    Worried About Running Out of Food in the Last Year: Never true    Ran Out of Food in the Last Year: Never true  Transportation Needs: No Transportation Needs (08/21/2022)   PRAPARE - Administrator, Civil Service (Medical): No    Lack of Transportation (Non-Medical): No  Physical Activity: Not on file  Stress: Not on file  Social Connections: Not on file  Intimate Partner Violence: Not At Risk (08/21/2022)   Humiliation, Afraid, Rape, and Kick questionnaire    Fear of Current or Ex-Partner: No    Emotionally Abused: No    Physically Abused: No    Sexually Abused: No   Family History: No family history on file.  Review of Systems: Constitutional: Denies fevers, chills or abnormal weight loss Eyes: Denies blurriness of vision Ears, nose, mouth, throat, and face: Denies mucositis or sore throat Respiratory: Denies cough, dyspnea or wheezes Cardiovascular: Denies palpitation, chest discomfort or lower extremity swelling Gastrointestinal:  Denies nausea, constipation, diarrhea GU: Denies dysuria or incontinence Skin: Denies abnormal skin rashes Neurological: Per HPI Musculoskeletal: Denies joint pain, back or neck discomfort. No decrease in ROM Behavioral/Psych: Denies anxiety, disturbance in thought content, and mood instability  Physical Exam: Vitals:   02/01/23 0853  BP: 119/74  Pulse: 77  Resp: 17  Temp: 98.2 F (36.8 C)  SpO2: 100%    KPS: 90. General: Alert, cooperative, pleasant, in no acute distress Head: Normal EENT: No conjunctival  injection or scleral icterus. Oral mucosa moist Lungs: Resp effort normal Cardiac: Regular rate and rhythm Abdomen: Soft, non-distended abdomen Skin: No rashes cyanosis or petechiae. Extremities: No clubbing or edema  Neurologic Exam: Mental Status: Awake, alert, attentive to examiner. Oriented to self and environment. Language is fluent with intact comprehension.  Cranial Nerves: Visual acuity is grossly normal. Visual fields are full. Extra-ocular movements intact. No ptosis. Face is symmetric, tongue midline. Motor: Tone and bulk are normal. Power is full in both arms and legs. Reflexes are symmetric, no pathologic reflexes present. Intact finger to nose bilaterally Sensory: Intact to light touch and temperature Gait: Normal and tandem gait is normal.   Labs: I have reviewed the data as listed    Component Value Date/Time   NA 139 09/12/2022 1341   K 4.4 09/12/2022 1341   CL 105 09/12/2022 1341   CO2 29 09/12/2022 1341   GLUCOSE 121 (H) 09/12/2022 1341   BUN 20 09/12/2022 1341   CREATININE 1.19 09/12/2022 1341   CALCIUM 9.5 09/12/2022 1341   PROT 6.7 09/12/2022 1341   ALBUMIN 4.6 09/12/2022 1341   AST 24 09/12/2022 1341   ALT 36 09/12/2022 1341   ALKPHOS 64 09/12/2022 1341   BILITOT 1.0 09/12/2022 1341   GFRNONAA >60 09/12/2022 1341   GFRAA >60 08/19/2018 0854   Lab Results  Component  Value Date   WBC 3.4 (L) 02/01/2023   NEUTROABS 2.3 02/01/2023   HGB 16.6 02/01/2023   HCT 46.5 02/01/2023   MCV 99.8 02/01/2023   PLT 114 (L) 02/01/2023     Assessment/Plan 1. Anaplastic astrocytoma [facility])  Robert Watkins is clinically stable today.  Labs remain within normal limits.  Avastin will be continue administered given high risk of radio-inflammatory syndrome.    We ultimately recommended continuing IV avastin 10mg /kg, now at q4 weeks.    Avastin should be held for the following:  ANC less than 500  Platelets less than 50,000  LFT or creatinine greater than 2x ULN   If clinical concerns/contraindications develop  Will remain off Tibsovo.  Will con't Lamictal 100mg  daily.  Ok with Klonopin 0.5mg  BID for anxiety.    We appreciate the opportunity to participate in the care of Robert Watkins.    We ask that Robert Watkins return to clinic in 4 weeks with MRI brain for review, or sooner as needed.  All questions were answered. The patient knows to call the clinic with any problems, questions or concerns. No barriers to learning were detected.  I have spent a total of 30 minutes of face-to-face and non-face-to-face time, excluding clinical staff time, preparing to see patient, ordering tests and/or medications, counseling the patient, and independently interpreting results and communicating results to the patient/family/caregiver    Henreitta Leber, MD Medical Director of Neuro-Oncology [facility] at [facility] 02/01/23 8:58 AM

## 2023-02-01 NOTE — Patient Instructions (Signed)
[facility]  Discharge Instructions: Thank you for choosing [facility] to provide your oncology and hematology care.   If you have a lab appointment with the [facility], please go directly to the [facility] and check in at the registration area.   Wear comfortable clothing and clothing appropriate for easy access to any Portacath or PICC line.   We strive to give you quality time with your provider. You may need to reschedule your appointment if you arrive late (15 or more minutes).  Arriving late affects you and other patients whose appointments are after yours.  Also, if you miss three or more appointments without notifying the office, you may be dismissed from the clinic at the provider's discretion.      For prescription refill requests, have your pharmacy contact our office and allow 72 hours for refills to be completed.    Today you received the following chemotherapy and/or immunotherapy agents: Bevacizumab      To help prevent nausea and vomiting after your treatment, we encourage you to take your nausea medication as directed.  BELOW ARE SYMPTOMS THAT SHOULD BE REPORTED IMMEDIATELY: *FEVER GREATER THAN 100.4 F (38 C) OR HIGHER *CHILLS OR SWEATING *NAUSEA AND VOMITING THAT IS NOT CONTROLLED WITH YOUR NAUSEA MEDICATION *UNUSUAL SHORTNESS OF BREATH *UNUSUAL BRUISING OR BLEEDING *URINARY PROBLEMS (pain or burning when urinating, or frequent urination) *BOWEL PROBLEMS (unusual diarrhea, constipation, pain near the anus) TENDERNESS IN MOUTH AND THROAT WITH OR WITHOUT PRESENCE OF ULCERS (sore throat, sores in mouth, or a toothache) UNUSUAL RASH, SWELLING OR PAIN  UNUSUAL VAGINAL DISCHARGE OR ITCHING   Items with * indicate a potential emergency and should be followed up as soon as possible or go to the Emergency Department if any problems should occur.  Please show the CHEMOTHERAPY ALERT CARD or IMMUNOTHERAPY ALERT CARD at  check-in to the Emergency Department and triage nurse.  Should you have questions after your visit or need to cancel or reschedule your appointment, please contact [facility]  Dept: [phone]  and follow the prompts.  Office hours are 8:00 a.m. to 4:30 p.m. Monday - Friday. Please note that voicemails left after 4:00 p.m. may not be returned until the following business day.  We are closed weekends and major holidays. You have access to a nurse at all times for urgent questions. Please call the main number to the clinic Dept: [phone] and follow the prompts.   For any non-urgent questions, you may also contact your provider using MyChart. We now offer e-Visits for anyone 82 and older to request care online for non-urgent symptoms. For details visit [email or web address].   Also download the MyChart app! Go to the app store, search "MyChart", open the app, select [facility], and log in with your MyChart username and password.

## 2023-02-08 ENCOUNTER — Other Ambulatory Visit: Payer: Managed Care, Other (non HMO)

## 2023-02-08 ENCOUNTER — Ambulatory Visit: Payer: Managed Care, Other (non HMO) | Admitting: Internal Medicine

## 2023-02-08 ENCOUNTER — Ambulatory Visit: Payer: Managed Care, Other (non HMO)

## 2023-02-15 ENCOUNTER — Other Ambulatory Visit: Payer: Self-pay | Admitting: Internal Medicine

## 2023-02-15 DIAGNOSIS — G40909 Epilepsy, unspecified, not intractable, without status epilepticus: Secondary | ICD-10-CM

## 2023-02-27 ENCOUNTER — Telehealth: Payer: Self-pay | Admitting: Internal Medicine

## 2023-02-27 ENCOUNTER — Other Ambulatory Visit: Payer: Self-pay

## 2023-02-27 NOTE — Telephone Encounter (Signed)
 Left patient a vm regarding upcoming appointment

## 2023-02-28 ENCOUNTER — Other Ambulatory Visit: Payer: Self-pay

## 2023-02-28 DIAGNOSIS — C719 Malignant neoplasm of brain, unspecified: Secondary | ICD-10-CM

## 2023-03-01 ENCOUNTER — Inpatient Hospital Stay: Payer: Managed Care, Other (non HMO)

## 2023-03-01 ENCOUNTER — Inpatient Hospital Stay: Payer: Managed Care, Other (non HMO) | Attending: Internal Medicine | Admitting: Internal Medicine

## 2023-03-01 ENCOUNTER — Telehealth: Payer: Self-pay | Admitting: Pharmacy Technician

## 2023-03-01 ENCOUNTER — Telehealth: Payer: Self-pay | Admitting: Pharmacist

## 2023-03-01 ENCOUNTER — Ambulatory Visit
Admission: RE | Admit: 2023-03-01 | Discharge: 2023-03-01 | Disposition: A | Discharge status: Home / Self Care | Payer: Managed Care, Other (non HMO) | Source: Ambulatory Visit | Attending: Internal Medicine | Admitting: Internal Medicine

## 2023-03-01 ENCOUNTER — Other Ambulatory Visit (HOSPITAL_COMMUNITY): Payer: Self-pay

## 2023-03-01 VITALS — BP 116/77 | HR 52 | Resp 16

## 2023-03-01 VITALS — BP 120/69 | HR 48 | Temp 98.1°F | Resp 20 | Wt 166.6 lb

## 2023-03-01 DIAGNOSIS — Z9221 Personal history of antineoplastic chemotherapy: Secondary | ICD-10-CM | POA: Insufficient documentation

## 2023-03-01 DIAGNOSIS — G40909 Epilepsy, unspecified, not intractable, without status epilepticus: Secondary | ICD-10-CM | POA: Insufficient documentation

## 2023-03-01 DIAGNOSIS — C719 Malignant neoplasm of brain, unspecified: Secondary | ICD-10-CM

## 2023-03-01 DIAGNOSIS — Z923 Personal history of irradiation: Secondary | ICD-10-CM | POA: Insufficient documentation

## 2023-03-01 DIAGNOSIS — Z79899 Other long term (current) drug therapy: Secondary | ICD-10-CM | POA: Insufficient documentation

## 2023-03-01 DIAGNOSIS — C714 Malignant neoplasm of occipital lobe: Secondary | ICD-10-CM | POA: Diagnosis present

## 2023-03-01 LAB — CBC WITH DIFFERENTIAL (CANCER CENTER ONLY)
Abs Immature Granulocytes: 0 10*3/uL (ref 0.00–0.07)
Basophils Absolute: 0 10*3/uL (ref 0.0–0.1)
Basophils Relative: 0 %
Eosinophils Absolute: 0.1 10*3/uL (ref 0.0–0.5)
Eosinophils Relative: 2 %
HCT: 40.2 % (ref 39.0–52.0)
Hemoglobin: 14.8 g/dL (ref 13.0–17.0)
Immature Granulocytes: 0 %
Lymphocytes Relative: 19 %
Lymphs Abs: 0.6 10*3/uL — ABNORMAL LOW (ref 0.7–4.0)
MCH: 36.1 pg — ABNORMAL HIGH (ref 26.0–34.0)
MCHC: 36.8 g/dL — ABNORMAL HIGH (ref 30.0–36.0)
MCV: 98 fL (ref 80.0–100.0)
Monocytes Absolute: 0.2 10*3/uL (ref 0.1–1.0)
Monocytes Relative: 7 %
Neutro Abs: 2.1 10*3/uL (ref 1.7–7.7)
Neutrophils Relative %: 72 %
Platelet Count: 102 10*3/uL — ABNORMAL LOW (ref 150–400)
RBC: 4.1 MIL/uL — ABNORMAL LOW (ref 4.22–5.81)
RDW: 11.8 % (ref 11.5–15.5)
WBC Count: 2.9 10*3/uL — ABNORMAL LOW (ref 4.0–10.5)
nRBC: 0 % (ref 0.0–0.2)

## 2023-03-01 LAB — TOTAL PROTEIN, URINE DIPSTICK: Protein, ur: NEGATIVE mg/dL

## 2023-03-01 MED ORDER — VORASIDENIB 40 MG PO TABS
40.0000 mg | ORAL_TABLET | Freq: Every day | ORAL | 2 refills | Status: DC
  Filled 2023-03-06: qty 30, 30d supply, fill #0
  Filled 2023-04-03: qty 30, 30d supply, fill #1
  Filled 2023-04-30: qty 30, 30d supply, fill #2

## 2023-03-01 MED ORDER — GADOPICLENOL 0.5 MMOL/ML IV SOLN
7.5000 mL | Freq: Once | INTRAVENOUS | Status: AC | PRN
Start: 2023-03-01 — End: 2023-03-01
  Administered 2023-03-01: 7.5 mL via INTRAVENOUS

## 2023-03-01 MED ORDER — SODIUM CHLORIDE 0.9 % IV SOLN
10.0000 mg/kg | Freq: Once | INTRAVENOUS | Status: AC
  Administered 2023-03-01: 800 mg via INTRAVENOUS
  Filled 2023-03-01: qty 32

## 2023-03-01 MED ORDER — SODIUM CHLORIDE 0.9 % IV SOLN: Freq: Once | INTRAVENOUS | Status: AC

## 2023-03-01 NOTE — Telephone Encounter (Signed)
Oral Oncology Pharmacist Encounter  Received new prescription for Voranigo (vorasidenib) for the treatment of IDH1 mutated astrocytoma in conjunction with bevacizumab, planned duration until disease progression or unacceptable drug toxicity.  CBC w/ Diff from 03/01/23 and CMP from 09/12/22 assessed, no baseline dose adjustments required. Confirmed with Dr. Barbaraann Cao OK to proceed with CMP from 08/2022, patient will get updated CMP after therapy initiation. Prescription dose and frequency assessed for appropriateness.  Current medication list in Epic reviewed, no relevant/significant DDIs with Voranigo identified.  Evaluated chart and no patient barriers to medication adherence noted.   Patient agreement for treatment documented in MD note on 03/01/23.  Prescription has been e-scribed to the [facility] for benefits analysis and approval.  Oral Oncology Clinic will continue to follow for insurance authorization, copayment issues, initial counseling and start date.  Lenord Carbo, PharmD, BCPS, [facility] Hematology/Oncology Clinical Pharmacist [facility] Oral Chemotherapy Navigation Clinics [phone] 03/01/2023 3:56 PM

## 2023-03-01 NOTE — Telephone Encounter (Signed)
Oral Oncology Patient Advocate Encounter  Prior Authorization for Robert Watkins has been approved.    PA# [id number] Effective dates: 03/01/23 through 02/29/24  Patients co-pay is $0.   Cost exceeds maximum override in place until   [name], CPhT-Adv Oncology Pharmacy Patient Advocate [facility] Direct Number: [phone]  Fax: [phone]

## 2023-03-01 NOTE — Progress Notes (Signed)
Requested that MRI images from 03/01/23 be sent to [facility] through [facility].

## 2023-03-01 NOTE — Patient Instructions (Signed)
[facility] - A DEPT OF [facility]  Discharge Instructions: Thank you for choosing [facility] to provide your oncology and hematology care.   If you have a lab appointment with the Cancer Center, please go directly to the Cancer Center and check in at the registration area.   Wear comfortable clothing and clothing appropriate for easy access to any Portacath or PICC line.   We strive to give you quality time with your provider. You may need to reschedule your appointment if you arrive late (15 or more minutes).  Arriving late affects you and other patients whose appointments are after yours.  Also, if you miss three or more appointments without notifying the office, you may be dismissed from the clinic at the provider's discretion.      For prescription refill requests, have your pharmacy contact our office and allow 72 hours for refills to be completed.    Today you received the following chemotherapy and/or immunotherapy agents: bevacizumab-awwb      To help prevent nausea and vomiting after your treatment, we encourage you to take your nausea medication as directed.  BELOW ARE SYMPTOMS THAT SHOULD BE REPORTED IMMEDIATELY: *FEVER GREATER THAN 100.4 F (38 C) OR HIGHER *CHILLS OR SWEATING *NAUSEA AND VOMITING THAT IS NOT CONTROLLED WITH YOUR NAUSEA MEDICATION *UNUSUAL SHORTNESS OF BREATH *UNUSUAL BRUISING OR BLEEDING *URINARY PROBLEMS (pain or burning when urinating, or frequent urination) *BOWEL PROBLEMS (unusual diarrhea, constipation, pain near the anus) TENDERNESS IN MOUTH AND THROAT WITH OR WITHOUT PRESENCE OF ULCERS (sore throat, sores in mouth, or a toothache) UNUSUAL RASH, SWELLING OR PAIN  UNUSUAL VAGINAL DISCHARGE OR ITCHING   Items with * indicate a potential emergency and should be followed up as soon as possible or go to the Emergency Department if any problems should occur.  Please show the CHEMOTHERAPY ALERT CARD or  IMMUNOTHERAPY ALERT CARD at check-in to the Emergency Department and triage nurse.  Should you have questions after your visit or need to cancel or reschedule your appointment, please contact [facility] - A DEPT OF [facility]  Dept: [phone]  and follow the prompts.  Office hours are 8:00 a.m. to 4:30 p.m. Monday - Friday. Please note that voicemails left after 4:00 p.m. may not be returned until the following business day.  We are closed weekends and major holidays. You have access to a nurse at all times for urgent questions. Please call the main number to the clinic Dept: [phone] and follow the prompts.   For any non-urgent questions, you may also contact your provider using MyChart. We now offer e-Visits for anyone 75 and older to request care online for non-urgent symptoms. For details visit [email or web address].   Also download the MyChart app! Go to the app store, search "MyChart", open the app, select [facility], and log in with your MyChart username and password.

## 2023-03-01 NOTE — Telephone Encounter (Signed)
Oral Oncology Patient Advocate Encounter   Received notification that prior authorization for Voranigo is required.   PA submitted on 03/01/23 Key [id number] Status is pending     [name], CPhT-Adv Oncology Pharmacy Patient Advocate [facility] Direct Number: [phone]  Fax: [phone]

## 2023-03-01 NOTE — Progress Notes (Signed)
[facility] at [facility] [address]  [place], [place] [address] [phone]   Interval Evaluation  Date of Service: 03/01/23 Patient Name: Robert Watkins Patient MRN: [id number] Patient DOB: 09/16/1985 Provider: Henreitta Leber, MD  Identifying Statement:  Robert Watkins is a 37 y.o. 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 Barnett Abu in [place]. 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 Lavonna Monarch. Pathology confirms anaplastic astrocytoma.   06/01/2010 Clinical Event-Other  New patient evaluation at [facility] Recommend radiation therapy with concurrent Temozolomide followed by twelve cycles of five-day Temozolomide.   06/13/2010 - 08/01/2010 Radiation  Radiation with Temodar.  06/13/2010 - 08/01/2010 Chemotherapy  Temodar 75 mg/m2 with radiation.  08/16/2010 - 08/07/2011 Chemotherapy  5 day Temodar 200 mg/m2  02/19/2018 Progression  Progression of non-enhancing tumor compared to 2013. Recommend initiating metronomic daily Temodar 50 mg/m2/day.  02/12/20 Progression Initiates Tibsovo 500mg  daily  10/21/20 Progression Adds oral CCNU 90mg /m2 q6 weeks to daily Tibsovo  08/06/22 Progression Repeat IMRT with concurrent Avastin 10mg /kg IV q2 weeks  11/02/22  Transitions to avastin monotherapy   Interval History: Robert Watkins presents today for follow up and avastin infusion.  Doesn't describe any new or progressive clinical concerns today.  Burden of fatigue is stable.  No headaches or seizures.  He remains active and functionally independent.  Anxiety continues to be a problem.  Medications: Current Outpatient Medications on File  Prior to Visit  Medication Sig Dispense Refill   lamoTRIgine (LAMICTAL) 100 MG tablet Take 1 tablet by mouth once daily 60 tablet 0   atomoxetine (STRATTERA) 40 MG capsule Take 40 mg by mouth daily. (Patient not taking: Reported on 03/01/2023)     clonazePAM (KLONOPIN) 0.5 MG tablet Take 1 tablet (0.5 mg total) by mouth 2 (two) times daily as needed for anxiety. 60 tablet 0   ivosidenib (TIBSOVO) 250 MG tablet Take 2 tablets (500 mg total) by mouth daily. (Patient not taking: Reported on 03/01/2023) 60 tablet 0   No current facility-administered medications on file prior to visit.    Allergies:  No Active Allergies  Past Medical History:  Past Medical History:  Diagnosis Date   Cancer of brain (HCC) 04/07/2011   Depression, reactive 04/07/2011   History of chemotherapy 06/13/2010-08/01/2010   42 day Temodar with concurrent Radiation   History of chemotherapy 08/16/2010-08/07/2011   5 day Temodar 200 mg/m2   History of radiation therapy 06/13/2010-08/01/2010   [facility]. Recommend radiation therapy with concurrent Temozolomide followed by twelve cycles of five-day Temozolomide. ?   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 Lavonna Monarch. Pathology confirms anaplastic astrocytoma. ?   CRANIOTOMY FOR STEREOTACTIC GUIDED SURGERY Left 05/12/2010   Stereotactic biopsy of left occipital lesion by Dr. Barnett Abu in [place]. Pathology demonstrates anaplastic astrocytoma (WHO III)?   Social History:  Social History   Socioeconomic History   Marital status: Single    Spouse name: Not on file   Number of children: Not on file  Years of education: Not on file   Highest education level: Not on file  Occupational History   Not on  file  Tobacco Use   Smoking status: Former   Smokeless tobacco: Never  Substance and Sexual Activity   Alcohol use: Yes    Alcohol/week: 4.0 standard drinks of alcohol    Types: 4 Shots of liquor per week   Drug use: Yes    Types: Marijuana   Sexual activity: Yes  Other Topics Concern   Not on file  Social History Narrative   Not on file   Social Determinants of Health   Financial Resource Strain: Not on file  Food Insecurity: No Food Insecurity (08/21/2022)   Hunger Vital Sign    Worried About Running Out of Food in the Last Year: Never true    Ran Out of Food in the Last Year: Never true  Transportation Needs: No Transportation Needs (08/21/2022)   PRAPARE - Administrator, Civil Service (Medical): No    Lack of Transportation (Non-Medical): No  Physical Activity: Not on file  Stress: Not on file  Social Connections: Not on file  Intimate Partner Violence: Not At Risk (08/21/2022)   Humiliation, Afraid, Rape, and Kick questionnaire    Fear of Current or Ex-Partner: No    Emotionally Abused: No    Physically Abused: No    Sexually Abused: No   Family History: No family history on file.  Review of Systems: Constitutional: Denies fevers, chills or abnormal weight loss Eyes: Denies blurriness of vision Ears, nose, mouth, throat, and face: Denies mucositis or sore throat Respiratory: Denies cough, dyspnea or wheezes Cardiovascular: Denies palpitation, chest discomfort or lower extremity swelling Gastrointestinal:  Denies nausea, constipation, diarrhea GU: Denies dysuria or incontinence Skin: Denies abnormal skin rashes Neurological: Per HPI Musculoskeletal: Denies joint pain, back or neck discomfort. No decrease in ROM Behavioral/Psych: Denies anxiety, disturbance in thought content, and mood instability  Physical Exam: Vitals:   03/01/23 1430  BP: 120/69  Pulse: (!) 48  Resp: 20  Temp: 98.1 F (36.7 C)  SpO2: 100%    KPS: 90. General: Alert,  cooperative, pleasant, in no acute distress Head: Normal EENT: No conjunctival injection or scleral icterus. Oral mucosa moist Lungs: Resp effort normal Cardiac: Regular rate and rhythm Abdomen: Soft, non-distended abdomen Skin: No rashes cyanosis or petechiae. Extremities: No clubbing or edema  Neurologic Exam: Mental Status: Awake, alert, attentive to examiner. Oriented to self and environment. Language is fluent with intact comprehension.  Cranial Nerves: Visual acuity is grossly normal. Visual fields are full. Extra-ocular movements intact. No ptosis. Face is symmetric, tongue midline. Motor: Tone and bulk are normal. Power is full in both arms and legs. Reflexes are symmetric, no pathologic reflexes present. Intact finger to nose bilaterally Sensory: Intact to light touch and temperature Gait: Normal and tandem gait is normal.   Labs: I have reviewed the data as listed    Component Value Date/Time   NA 139 09/12/2022 1341   K 4.4 09/12/2022 1341   CL 105 09/12/2022 1341   CO2 29 09/12/2022 1341   GLUCOSE 121 (H) 09/12/2022 1341   BUN 20 09/12/2022 1341   CREATININE 1.19 09/12/2022 1341   CALCIUM 9.5 09/12/2022 1341   PROT 6.7 09/12/2022 1341   ALBUMIN 4.6 09/12/2022 1341   AST 24 09/12/2022 1341   ALT 36 09/12/2022 1341   ALKPHOS 64 09/12/2022 1341   BILITOT 1.0 09/12/2022 1341   GFRNONAA >60 09/12/2022 1341  GFRAA >60 08/19/2018 0854   Lab Results  Component Value Date   WBC 2.9 (L) 03/01/2023   NEUTROABS 2.1 03/01/2023   HGB 14.8 03/01/2023   HCT 40.2 03/01/2023   MCV 98.0 03/01/2023   PLT 102 (L) 03/01/2023    Imaging:  [facility] Clinician Interpretation: I have personally reviewed the CNS images as listed.  My interpretation, in the context of the patient's clinical presentation, is progressive disease  MR BRAIN W WO CONTRAST  Result Date: 03/01/2023 CLINICAL DATA:  Anaplastic astrocytoma, assess treatment response EXAM: MRI HEAD WITHOUT AND WITH CONTRAST  TECHNIQUE: Multiplanar, multiecho pulse sequences of the brain and surrounding structures were obtained without and with intravenous contrast. CONTRAST:  7.5 mL Vueway COMPARISON:  12/29/2022 FINDINGS: Brain: Status post left parietooccipital craniotomy with resection cavity in the medial left occipital lobe, with unchanged minimal enhancement along the posteroinferior margin (series 15, image 86). Redemonstrated ill-defined enhancement in the posterolateral left thalamus and associated white matter tracks extending along the atrium of the left lateral ventricle, which appears slightly increased from the prior exam (series 16, image 11-12 and series 17, image 17). Associated T2 hyperintense signal appears mildly increased, particularly along the lateral aspect of the body of the left lateral ventricle (series 11, image 24). New enhancement along the medial aspect of the left lateral ventricle, extending into the posterior body and splenium of the corpus callosum (series 16, image 14; series 15, image 103; series 17, image 13). This is associated with increased T2 hyperintense signal medial aspect of the left lateral ventricle atrium, extending along the across the splenium of the corpus callosum, into the posterior body of the corpus callosum, and into the right cerebral hemisphere (series 11, image 24). New abnormal enhancement and increased T2 signal are also noted along the left-greater-than-right genu of the corpus callosum (series 15, image 90 and series 11, image 21). No evidence of acute infarct, hemorrhage, midline shift, hydrocephalus, or extra-axial collection. Vascular: Normal arterial flow voids. Normal arterial and venous enhancement. Skull and upper cervical spine: Left parieto-occipital craniotomy. Otherwise normal marrow signal. Sinuses/Orbits: No acute finding. IMPRESSION: 1. New enhancement along the medial aspect of the left lateral ventricle, extending into the splenium and posterior body of the  corpus callosum, and into the right cerebral hemisphere, which is associated with increased T2 hyperintense signal. There is also abnormal T2 hyperintense signal and enhancement in the genu of the corpus callosum, which is new from prior exam. Findings are concerning for progression of disease. 2. Slightly increased enhancement in the posterolateral left thalamus and associated white matter tracks extending along the atrium of the left lateral ventricle, with mildly increased associated T2 hyperintense signal. Electronically Signed   By: Wiliam Ke M.D.   On: 03/01/2023 13:59     Assessment/Plan 1. Anaplastic astrocytoma [facility])  Mr. Coston is clinically stable today.  Unfortunately MRI brain demonstrates clear progression of disease, with non-enhancing disease infiltrating across corpus callosum, and novel enhancement adjacent to left lateral ventricle outside of recent radiation treatment field.  Recommended transitioning to IDH-1 inhibitor Vorasidenib at this time, 40mg  daily PO. He is agreeable with this.  We will check LFTs after 2-3 weeks of drug initiation.  Will discuss with oral chemotherapy pharmacist as well, regarding drug acess and approval.    Will re-image after 1 month given rapid and multifocal nature of changes seen on MRI today.  Avastin will continue to be administered given high risk of radio-inflammatory syndrome.    We ultimately recommended  continuing IV avastin 10mg /kg, now at q4 weeks.    Avastin should be held for the following:  ANC less than 500  Platelets less than 50,000  LFT or creatinine greater than 2x ULN  If clinical concerns/contraindications develop  Will con't Lamictal 100mg  daily.  Ok with Klonopin 0.5mg  BID for anxiety.    We appreciate the opportunity to participate in the care of Kejon Feild.    We ask that Josef Tourigny return to clinic in 4 weeks with MRI brain for review, or sooner as needed.  All questions were answered. The patient  knows to call the clinic with any problems, questions or concerns. No barriers to learning were detected.  I have spent a total of 40 minutes of face-to-face and non-face-to-face time, excluding clinical staff time, preparing to see patient, ordering tests and/or medications, counseling the patient, and independently interpreting results and communicating results to the patient/family/caregiver    Robert Leber, MD Medical Director of Neuro-Oncology [facility] 03/01/23 2:55 PM

## 2023-03-04 ENCOUNTER — Other Ambulatory Visit: Payer: Self-pay

## 2023-03-05 ENCOUNTER — Other Ambulatory Visit: Payer: Self-pay | Admitting: Pharmacy Technician

## 2023-03-05 ENCOUNTER — Other Ambulatory Visit: Payer: Self-pay

## 2023-03-06 ENCOUNTER — Other Ambulatory Visit: Payer: Self-pay

## 2023-03-06 ENCOUNTER — Other Ambulatory Visit (HOSPITAL_COMMUNITY): Payer: Self-pay

## 2023-03-06 ENCOUNTER — Telehealth: Payer: Self-pay | Admitting: Internal Medicine

## 2023-03-06 NOTE — Progress Notes (Signed)
Specialty Pharmacy Initial Fill Coordination Note  Robert Watkins is a 37 y.o. male contacted today regarding refills of specialty medication(s) Vorasidenib .  Patient requested [name] at [facility] [place]  on 03/08/23   Medication will be filled on 03/07/23.   Patient is aware of $0 copayment.

## 2023-03-06 NOTE — Telephone Encounter (Signed)
Oral Chemotherapy Pharmacist Encounter  Patient Education I spoke with patient for overview of new oral chemotherapy medication: Voranigo (vorasidenib) for the treatment of IDH1 mutated astrocytoma in conjunction with bevacizumab, planned duration until disease progression or unacceptable drug toxicity.   Counseled patient on administration, dosing, side effects, monitoring, drug-food interactions, safe handling, storage, and disposal.  Patient will take Voranigo 40 mg tablets, 1 tablet (40 mg total) by mouth daily.  Start date: 03/09/23  Side effects include but not limited to: increased LFTs, myalgias, diarrhea, fatigue.    Reviewed with patient importance of keeping a medication schedule and plan for any missed doses.  After discussion with patient no patient barriers to medication adherence identified.   Patient will pick medication up from [facility] on 03/08/23.  Robert Watkins voiced understanding and appreciation. All questions answered. Medication handout provided.  Provided patient with Oral Chemotherapy Navigation Clinic phone number. Patient knows to call the office with questions or concerns.  Lenord Carbo, PharmD, BCPS, [facility] Hematology/Oncology Clinical Pharmacist [facility] Oral Chemotherapy Navigation Clinics [phone] 03/06/2023 8:59 AM

## 2023-03-06 NOTE — Telephone Encounter (Signed)
 Left patient a vm regarding upcoming appointment

## 2023-03-06 NOTE — Progress Notes (Signed)
Oral Chemotherapy Pharmacist Encounter  Patient was counseled under telephone encounter from 03/01/23.  Lenord Carbo, PharmD, BCPS, [facility] Hematology/Oncology Clinical Pharmacist [facility] Oral Chemotherapy Navigation Clinics [phone] 03/06/2023 9:09 AM

## 2023-03-07 ENCOUNTER — Other Ambulatory Visit: Payer: Self-pay

## 2023-03-07 ENCOUNTER — Other Ambulatory Visit (HOSPITAL_COMMUNITY): Payer: Self-pay

## 2023-03-08 ENCOUNTER — Other Ambulatory Visit (HOSPITAL_COMMUNITY): Payer: Self-pay

## 2023-03-20 ENCOUNTER — Inpatient Hospital Stay: Payer: Managed Care, Other (non HMO)

## 2023-03-20 DIAGNOSIS — C714 Malignant neoplasm of occipital lobe: Secondary | ICD-10-CM | POA: Diagnosis not present

## 2023-03-20 DIAGNOSIS — C719 Malignant neoplasm of brain, unspecified: Secondary | ICD-10-CM

## 2023-03-20 LAB — CBC WITH DIFFERENTIAL/PLATELET
Abs Immature Granulocytes: 0.01 10*3/uL (ref 0.00–0.07)
Basophils Absolute: 0 10*3/uL (ref 0.0–0.1)
Basophils Relative: 0 %
Eosinophils Absolute: 0.1 10*3/uL (ref 0.0–0.5)
Eosinophils Relative: 2 %
HCT: 43.8 % (ref 39.0–52.0)
Hemoglobin: 16 g/dL (ref 13.0–17.0)
Immature Granulocytes: 0 %
Lymphocytes Relative: 17 %
Lymphs Abs: 0.6 10*3/uL — ABNORMAL LOW (ref 0.7–4.0)
MCH: 36 pg — ABNORMAL HIGH (ref 26.0–34.0)
MCHC: 36.5 g/dL — ABNORMAL HIGH (ref 30.0–36.0)
MCV: 98.4 fL (ref 80.0–100.0)
Monocytes Absolute: 0.2 10*3/uL (ref 0.1–1.0)
Monocytes Relative: 6 %
Neutro Abs: 2.5 10*3/uL (ref 1.7–7.7)
Neutrophils Relative %: 75 %
Platelets: 106 10*3/uL — ABNORMAL LOW (ref 150–400)
RBC: 4.45 MIL/uL (ref 4.22–5.81)
RDW: 11.6 % (ref 11.5–15.5)
WBC: 3.4 10*3/uL — ABNORMAL LOW (ref 4.0–10.5)
nRBC: 0 % (ref 0.0–0.2)

## 2023-03-20 LAB — CMP (CANCER CENTER ONLY)
ALT: 39 U/L (ref 0–44)
AST: 25 U/L (ref 15–41)
Albumin: 4.5 g/dL (ref 3.5–5.0)
Alkaline Phosphatase: 50 U/L (ref 38–126)
Anion gap: 3 — ABNORMAL LOW (ref 5–15)
BUN: 16 mg/dL (ref 6–20)
CO2: 31 mmol/L (ref 22–32)
Calcium: 9.7 mg/dL (ref 8.9–10.3)
Chloride: 106 mmol/L (ref 98–111)
Creatinine: 1.24 mg/dL (ref 0.61–1.24)
GFR, Estimated: >60 mL/min (ref >60)
Glucose, Bld: 97 mg/dL (ref 70–99)
Potassium: 4.5 mmol/L (ref 3.5–5.1)
Sodium: 140 mmol/L (ref 135–145)
Total Bilirubin: 1.2 mg/dL — ABNORMAL HIGH (ref <1.2)
Total Protein: 6.9 g/dL (ref 6.5–8.1)

## 2023-03-23 ENCOUNTER — Other Ambulatory Visit (HOSPITAL_COMMUNITY): Payer: Self-pay

## 2023-03-23 ENCOUNTER — Other Ambulatory Visit: Payer: Self-pay

## 2023-03-26 ENCOUNTER — Other Ambulatory Visit (HOSPITAL_COMMUNITY): Payer: Self-pay

## 2023-03-27 ENCOUNTER — Telehealth: Payer: Self-pay | Admitting: *Deleted

## 2023-03-27 NOTE — Telephone Encounter (Signed)
Returned PC to patient's mother, Consuella Lose - she states patient has been having increasing nausea & extreme fatigue, he had to leave work today.  She says the nausea started a few days before he started his oral chemo, Voranigo, on 03/09/23.  It has become increasingly worse along with fatigue.  She also said he has some zofran at home - advised her to instruct Kiaeem to take zofran 8 mg every 8 hours for the next 24-48 hours to see if he has relief.  Babatunde has appointment with Dr Barbaraann Cao on 03/29/23.  Consuella Lose verbalizes understanding, Dr Barbaraann Cao informed.

## 2023-03-28 ENCOUNTER — Telehealth: Payer: Self-pay | Admitting: Internal Medicine

## 2023-03-28 ENCOUNTER — Other Ambulatory Visit: Payer: Self-pay

## 2023-03-28 ENCOUNTER — Other Ambulatory Visit: Payer: Self-pay | Admitting: *Deleted

## 2023-03-28 MED ORDER — ONDANSETRON HCL 8 MG PO TABS: 8.0000 mg | ORAL_TABLET | Freq: Three times a day (TID) | ORAL | 0 refills | Status: DC | PRN

## 2023-03-28 NOTE — Telephone Encounter (Signed)
Called patient mother Consuella Lose an spoke with her about rescheduling the appointment for 12/5 to after MRI is complete to 12/12. Mother Consuella Lose ) confirmed appointments and asked that I leave message on patient vm, unable to answer patient is at work. Message left of rescheduled time and date of future appointments for 12/12 f/u.

## 2023-03-29 ENCOUNTER — Inpatient Hospital Stay: Payer: Managed Care, Other (non HMO)

## 2023-03-29 ENCOUNTER — Other Ambulatory Visit: Payer: Self-pay

## 2023-03-29 ENCOUNTER — Inpatient Hospital Stay: Payer: Managed Care, Other (non HMO) | Admitting: Internal Medicine

## 2023-03-30 ENCOUNTER — Other Ambulatory Visit: Payer: Self-pay

## 2023-04-01 ENCOUNTER — Ambulatory Visit
Admission: RE | Admit: 2023-04-01 | Discharge: 2023-04-01 | Disposition: A | Discharge status: Home / Self Care | Payer: Managed Care, Other (non HMO) | Source: Ambulatory Visit | Attending: Internal Medicine | Admitting: Internal Medicine

## 2023-04-01 DIAGNOSIS — C719 Malignant neoplasm of brain, unspecified: Secondary | ICD-10-CM

## 2023-04-01 MED ORDER — GADOPICLENOL 0.5 MMOL/ML IV SOLN
7.0000 mL | Freq: Once | INTRAVENOUS | Status: AC | PRN
  Administered 2023-04-01: 7 mL via INTRAVENOUS

## 2023-04-02 ENCOUNTER — Encounter: Payer: Self-pay | Admitting: *Deleted

## 2023-04-02 NOTE — Progress Notes (Signed)
Requested MRI to be pushed to [facility] per Dr Liana Gerold request

## 2023-04-03 ENCOUNTER — Other Ambulatory Visit: Payer: Self-pay

## 2023-04-03 ENCOUNTER — Other Ambulatory Visit (HOSPITAL_COMMUNITY): Payer: Self-pay

## 2023-04-03 NOTE — Progress Notes (Signed)
Specialty Pharmacy Refill Coordination Note  Maynor Evetts is a 37 y.o. male contacted today regarding refills of specialty medication(s) Vorasidenib   Patient requested [name] at [facility] at [place] date: 04/06/23   Medication will be filled on 04/05/23.

## 2023-04-03 NOTE — Progress Notes (Signed)
Specialty Pharmacy Ongoing Clinical Assessment Note  Robert Watkins is a 37 y.o. male who is being followed by the specialty pharmacy service for RxSp Oncology   [name] specialty medication(s) reviewed today: Vorasidenib   Missed doses in the last 4 weeks: 0   Patient/Caregiver did not have any additional questions or concerns.   Therapeutic benefit summary: Unable to assess   Adverse events/side effects summary: Experienced adverse events/side effects (using Zofran for nausea)  Discussed with patient using Zofran before Voranigo and/or consistently to prevent nausea. Patient is experiencing headaches and hiccups which may be attributed from the cancer. Patient reports some memory loss, but states it is getting better. Patient has an MRI and a follow-up appointment with Dr. Barbaraann Cao on Thursday, 04/06/23.   [name] therapy is appropriate to: Continue    Goals Addressed             This Visit's Progress    Stabilization of disease       Patient is initiating therapy. Patient will be evaluated at upcoming provider appointment to assess progress         Follow up:  3 months  Bobette Mo Specialty Pharmacist

## 2023-04-04 ENCOUNTER — Other Ambulatory Visit: Payer: Self-pay

## 2023-04-05 ENCOUNTER — Other Ambulatory Visit: Payer: Self-pay | Admitting: *Deleted

## 2023-04-05 ENCOUNTER — Inpatient Hospital Stay: Payer: Managed Care, Other (non HMO) | Attending: Internal Medicine

## 2023-04-05 ENCOUNTER — Other Ambulatory Visit: Payer: Self-pay

## 2023-04-05 ENCOUNTER — Inpatient Hospital Stay (HOSPITAL_BASED_OUTPATIENT_CLINIC_OR_DEPARTMENT_OTHER): Payer: Managed Care, Other (non HMO) | Admitting: Internal Medicine

## 2023-04-05 ENCOUNTER — Other Ambulatory Visit (HOSPITAL_COMMUNITY): Payer: Self-pay

## 2023-04-05 ENCOUNTER — Encounter: Payer: Self-pay | Admitting: Internal Medicine

## 2023-04-05 ENCOUNTER — Inpatient Hospital Stay: Payer: Managed Care, Other (non HMO)

## 2023-04-05 VITALS — BP 107/66 | HR 82 | Temp 98.1°F | Resp 18 | Ht 71.0 in | Wt 168.4 lb

## 2023-04-05 DIAGNOSIS — R630 Anorexia: Secondary | ICD-10-CM | POA: Diagnosis not present

## 2023-04-05 DIAGNOSIS — Z87891 Personal history of nicotine dependence: Secondary | ICD-10-CM | POA: Insufficient documentation

## 2023-04-05 DIAGNOSIS — C719 Malignant neoplasm of brain, unspecified: Secondary | ICD-10-CM

## 2023-04-05 DIAGNOSIS — Z5111 Encounter for antineoplastic chemotherapy: Secondary | ICD-10-CM | POA: Insufficient documentation

## 2023-04-05 DIAGNOSIS — F419 Anxiety disorder, unspecified: Secondary | ICD-10-CM | POA: Insufficient documentation

## 2023-04-05 DIAGNOSIS — R112 Nausea with vomiting, unspecified: Secondary | ICD-10-CM | POA: Diagnosis not present

## 2023-04-05 DIAGNOSIS — G40909 Epilepsy, unspecified, not intractable, without status epilepticus: Secondary | ICD-10-CM | POA: Insufficient documentation

## 2023-04-05 DIAGNOSIS — C714 Malignant neoplasm of occipital lobe: Secondary | ICD-10-CM | POA: Diagnosis present

## 2023-04-05 DIAGNOSIS — Z9221 Personal history of antineoplastic chemotherapy: Secondary | ICD-10-CM | POA: Diagnosis not present

## 2023-04-05 DIAGNOSIS — Z79899 Other long term (current) drug therapy: Secondary | ICD-10-CM | POA: Diagnosis not present

## 2023-04-05 DIAGNOSIS — R066 Hiccough: Secondary | ICD-10-CM | POA: Insufficient documentation

## 2023-04-05 LAB — CMP (CANCER CENTER ONLY)
ALT: 59 U/L — ABNORMAL HIGH (ref 0–44)
AST: 30 U/L (ref 15–41)
Albumin: 4.5 g/dL (ref 3.5–5.0)
Alkaline Phosphatase: 53 U/L (ref 38–126)
Anion gap: 4 — ABNORMAL LOW (ref 5–15)
BUN: 20 mg/dL (ref 6–20)
CO2: 30 mmol/L (ref 22–32)
Calcium: 9.5 mg/dL (ref 8.9–10.3)
Chloride: 107 mmol/L (ref 98–111)
Creatinine: 1.31 mg/dL — ABNORMAL HIGH (ref 0.61–1.24)
GFR, Estimated: >60 mL/min (ref >60)
Glucose, Bld: 82 mg/dL (ref 70–99)
Potassium: 4.1 mmol/L (ref 3.5–5.1)
Sodium: 141 mmol/L (ref 135–145)
Total Bilirubin: 0.8 mg/dL (ref <1.2)
Total Protein: 6.7 g/dL (ref 6.5–8.1)

## 2023-04-05 LAB — CBC WITH DIFFERENTIAL (CANCER CENTER ONLY)
Abs Immature Granulocytes: 0.01 10*3/uL (ref 0.00–0.07)
Basophils Absolute: 0 10*3/uL (ref 0.0–0.1)
Basophils Relative: 1 %
Eosinophils Absolute: 0.1 10*3/uL (ref 0.0–0.5)
Eosinophils Relative: 1 %
HCT: 40.3 % (ref 39.0–52.0)
Hemoglobin: 15.1 g/dL (ref 13.0–17.0)
Immature Granulocytes: 0 %
Lymphocytes Relative: 17 %
Lymphs Abs: 0.6 10*3/uL — ABNORMAL LOW (ref 0.7–4.0)
MCH: 36.5 pg — ABNORMAL HIGH (ref 26.0–34.0)
MCHC: 37.5 g/dL — ABNORMAL HIGH (ref 30.0–36.0)
MCV: 97.3 fL (ref 80.0–100.0)
Monocytes Absolute: 0.2 10*3/uL (ref 0.1–1.0)
Monocytes Relative: 5 %
Neutro Abs: 2.6 10*3/uL (ref 1.7–7.7)
Neutrophils Relative %: 76 %
Platelet Count: 105 10*3/uL — ABNORMAL LOW (ref 150–400)
RBC: 4.14 MIL/uL — ABNORMAL LOW (ref 4.22–5.81)
RDW: 11.4 % — ABNORMAL LOW (ref 11.5–15.5)
WBC Count: 3.5 10*3/uL — ABNORMAL LOW (ref 4.0–10.5)
nRBC: 0 % (ref 0.0–0.2)

## 2023-04-05 LAB — TOTAL PROTEIN, URINE DIPSTICK: Protein, ur: NEGATIVE mg/dL

## 2023-04-05 MED ORDER — PROCHLORPERAZINE MALEATE 10 MG PO TABS
10.0000 mg | ORAL_TABLET | Freq: Four times a day (QID) | ORAL | 0 refills | Status: DC | PRN
  Filled 2023-04-05 – 2023-04-07 (×2): qty 30, 8d supply, fill #0

## 2023-04-05 MED ORDER — ONDANSETRON HCL 8 MG PO TABS
8.0000 mg | ORAL_TABLET | Freq: Three times a day (TID) | ORAL | 1 refills | Status: DC | PRN
  Filled 2023-04-05 – 2023-04-07 (×2): qty 60, 20d supply, fill #0

## 2023-04-05 NOTE — Progress Notes (Signed)
DISCONTINUE ON PATHWAY REGIMEN - Neuro     A cycle is every 42 days:     Lomustine   **Always confirm dose/schedule in your pharmacy ordering system**  PRIOR TREATMENT: [id number]: Lomustine 110 mg/m2 D1 q42 Days  START OFF PATHWAY REGIMEN - Neuro   [id number]:Carboplatin AUC=2 IV D1 q7 Days:   A cycle is every 7 days:     Carboplatin   **Always confirm dose/schedule in your pharmacy ordering system**  Patient Characteristics: Glioma, Grade 3 or 4 Astrocytoma, IDH-mutant, Recurrent or Progressive, Nonsurgical Candidate, Systemic Therapy Candidate, BRAF V600E Mutation Negative/Unknown and NTRK Fusion Negative/Unknown Disease Classification: Glioma Disease Classification: Grade 3 or 4 Astrocytoma, IDH-mutant Disease Status: Recurrent or Progressive Treatment Classification: Nonsurgical Candidate Treatment (Nonsurgical/Adjuvant): Systemic Therapy Candidate NTRK Gene Fusion Status: Did Not Order Test BRAF V600E Mutation Status: Did Not Order Test Intent of Therapy: Non-Curative / Palliative Intent, Discussed with Patient

## 2023-04-05 NOTE — Progress Notes (Signed)
[facility] [address]  [place], [place] [address] [phone]   Interval Evaluation  Date of Service: 04/05/23 Patient Name: Fayez Billips Patient MRN: [id number] Patient DOB: 08-Oct-1985 Provider: Henreitta Leber, MD  Identifying Statement:  Timothy Hosten is a 37 y.o. 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 Barnett Abu in [place]. 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 Lavonna Monarch. Pathology confirms anaplastic astrocytoma.   06/01/2010 Clinical Event-Other  New patient evaluation at [facility]. Recommend radiation therapy with concurrent Temozolomide followed by twelve cycles of five-day Temozolomide.   06/13/2010 - 08/01/2010 Radiation  Radiation with Temodar.  06/13/2010 - 08/01/2010 Chemotherapy  Temodar 75 mg/m2 with radiation.  08/16/2010 - 08/07/2011 Chemotherapy  5 day Temodar 200 mg/m2  02/19/2018 Progression  Progression of non-enhancing tumor compared to 2013. Recommend initiating metronomic daily Temodar 50 mg/m2/day.  02/12/20 Progression Initiates Tibsovo 500mg  daily  10/21/20 Progression Adds oral CCNU 90mg /m2 q6 weeks to daily Tibsovo  08/06/22 Progression Repeat IMRT with concurrent Avastin 10mg /kg IV q2 weeks  11/02/22  Transitions to avastin monotherapy   Interval History: Dominico Campagna presents today for follow up after recent MRI brain.  Voranigo was started on 03/08/23.  Today he describes increase in nausea, this preceded the new oral medication by several days.  Has been dosing zofran twice per day, which is helping the symptoms.  Energy level is decreased as well.  Some subjective  imbalance is noted. No headaches or seizures.  He remains otherwise active and functionally independent.  Anxiety continues to be a problem.  Medications: Current Outpatient Medications on File Prior to Visit  Medication Sig Dispense Refill   atomoxetine (STRATTERA) 40 MG capsule Take 40 mg by mouth daily. (Patient not taking: Reported on 03/01/2023)     clonazePAM (KLONOPIN) 0.5 MG tablet Take 1 tablet (0.5 mg total) by mouth 2 (two) times daily as needed for anxiety. 60 tablet 0   lamoTRIgine (LAMICTAL) 100 MG tablet Take 1 tablet by mouth once daily 60 tablet 0   ondansetron (ZOFRAN) 8 MG tablet Take 1 tablet (8 mg total) by mouth every 8 (eight) hours as needed for nausea or vomiting. 20 tablet 0   vorasidenib (VORANGIO) 40 MG tablet Take 40 mg by mouth daily. 30 tablet 2   No current facility-administered medications on file prior to visit.    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 Recommend radiation therapy with concurrent Temozolomide followed by twelve cycles of five-day Temozolomide. ?   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 Lavonna Monarch. Pathology confirms anaplastic astrocytoma. ?   CRANIOTOMY FOR STEREOTACTIC GUIDED SURGERY Left 05/12/2010  Stereotactic biopsy of left occipital lesion by Dr. Barnett Abu in [place]. Pathology demonstrates anaplastic astrocytoma  (WHO III)?   Social History:  Social History   Socioeconomic History   Marital status: Single    Spouse name: Not on file   Number of children: Not on file   Years of education: Not on file   Highest education level: Not on file  Occupational History   Not on file  Tobacco Use   Smoking status: Former   Smokeless tobacco: Never  Substance and Sexual Activity   Alcohol use: Yes    Alcohol/week: 4.0 standard drinks of alcohol    Types: 4 Shots of liquor per week   Drug use: Yes    Types: Marijuana   Sexual activity: Yes  Other Topics Concern   Not on file  Social History Narrative   Not on file   Social Drivers of Health   Financial Resource Strain: Not on file  Food Insecurity: No Food Insecurity (08/21/2022)   Hunger Vital Sign    Worried About Running Out of Food in the Last Year: Never true    Ran Out of Food in the Last Year: Never true  Transportation Needs: No Transportation Needs (08/21/2022)   PRAPARE - Administrator, Civil Service (Medical): No    Lack of Transportation (Non-Medical): No  Physical Activity: Not on file  Stress: Not on file  Social Connections: Not on file  Intimate Partner Violence: Not At Risk (08/21/2022)   Humiliation, Afraid, Rape, and Kick questionnaire    Fear of Current or Ex-Partner: No    Emotionally Abused: No    Physically Abused: No    Sexually Abused: No   Family History: No family history on file.  Review of Systems: Constitutional: Denies fevers, chills or abnormal weight loss Eyes: Denies blurriness of vision Ears, nose, mouth, throat, and face: Denies mucositis or sore throat Respiratory: Denies cough, dyspnea or wheezes Cardiovascular: Denies palpitation, chest discomfort or lower extremity swelling Gastrointestinal:  Denies nausea, constipation, diarrhea GU: Denies dysuria or incontinence Skin: Denies abnormal skin rashes Neurological: Per HPI Musculoskeletal: Denies joint pain, back or neck discomfort.  No decrease in ROM Behavioral/Psych: Denies anxiety, disturbance in thought content, and mood instability  Physical Exam: Vitals:   04/05/23 1425  BP: 107/66  Pulse: 82  Resp: 18  Temp: 98.1 F (36.7 C)  SpO2: 100%    KPS: 90. General: Alert, cooperative, pleasant, in no acute distress Head: Normal EENT: No conjunctival injection or scleral icterus. Oral mucosa moist Lungs: Resp effort normal Cardiac: Regular rate and rhythm Abdomen: Soft, non-distended abdomen Skin: No rashes cyanosis or petechiae. Extremities: No clubbing or edema  Neurologic Exam: Mental Status: Awake, alert, attentive to examiner. Oriented to self and environment. Language is fluent with intact comprehension.  Cranial Nerves: Visual acuity is grossly normal. Visual fields are full. Extra-ocular movements intact. No ptosis. Face is symmetric, tongue midline. Motor: Tone and bulk are normal. Power is full in both arms and legs. Reflexes are symmetric, no pathologic reflexes present. Intact finger to nose bilaterally Sensory: Intact to light touch and temperature Gait: Normal    Labs: I have reviewed the data as listed    Component Value Date/Time   NA 140 03/20/2023 0858   K 4.5 03/20/2023 0858   CL 106 03/20/2023 0858   CO2 31 03/20/2023 0858   GLUCOSE 97 03/20/2023 0858   BUN 16 03/20/2023 0858   CREATININE 1.24 03/20/2023 0858  CALCIUM 9.7 03/20/2023 0858   PROT 6.9 03/20/2023 0858   ALBUMIN 4.5 03/20/2023 0858   AST 25 03/20/2023 0858   ALT 39 03/20/2023 0858   ALKPHOS 50 03/20/2023 0858   BILITOT 1.2 (H) 03/20/2023 0858   GFRNONAA >60 03/20/2023 0858   GFRAA >60 08/19/2018 0854   Lab Results  Component Value Date   WBC 3.5 (L) 04/05/2023   NEUTROABS 2.6 04/05/2023   HGB 15.1 04/05/2023   HCT 40.3 04/05/2023   MCV 97.3 04/05/2023   PLT 105 (L) 04/05/2023    Imaging:  [facility] Clinician Interpretation: I have personally reviewed the CNS images as listed.  My interpretation, in the  context of the patient's clinical presentation, is progressive disease  MR BRAIN W WO CONTRAST Result Date: 04/01/2023 CLINICAL DATA:  Brain/CNS neoplasm, assess treatment response EXAM: MRI HEAD WITHOUT AND WITH CONTRAST TECHNIQUE: Multiplanar, multiecho pulse sequences of the brain and surrounding structures were obtained without and with intravenous contrast. CONTRAST:  7 ml Vueway COMPARISON:  Brain MR 03/01/23, 12/29/22 FINDINGS: Brain: Negative for acute infarct. No hemorrhage. No hydrocephalus. No extra-axial fluid collection. Redemonstrated postsurgical changes with a left parieto-occipital craniotomy with resection cavity in the medial left occipital lobe. The degree of T2/FLAIR hyperintense signal abnormality surrounding the resection cavity has slightly increased, for example along the lateral aspect of the left parietal lobe (series II, image 40). The component along the genu of the corpus callosum and in the left basal ganglia has also increased from prior exam (series 112, image 21, 18). Compared to prior exam there is new/increased T2/FLAIR hyperintense signal abnormality around the fourth ventricle in the medial left cerebellum (series 112, image 8). The degree of nodular contrast enhancement around the left pulmonary venous and periatrial white matter is grossly unchanged in extent. Increased conspicuity of the region of contrast enhancement around the left thalamus (series 119, image 8), which is nonspecific and may be related to technique. Compared to prior exam there is new contrast enhancement along the medial aspect of the of cerebellum. Vascular: Normal flow voids. Skull and upper cervical spine: Normal marrow signal. Sinuses/Orbits: No middle ear or mastoid effusion. Paranasal sinuses are clear. Orbits are unremarkable. Other: None. IMPRESSION: Overall findings are worrisome for progressive disease with new/increased T2/FLAIR hyperintense signal abnormality around the fourth ventricle and the  medial left cerebellum. The degree of T2/FLAIR hyperintense signal abnormality surrounding the resection cavity in the left occipital lobe and along the genu of the corpus callosum has also increased. Electronically Signed   By: Lorenza Cambridge M.D.   On: 04/01/2023 13:50     Assessment/Plan 1. Anaplastic astrocytoma [facility])  Mr. Chinen is clinically progressive today, with nausea and imbalance which are secondary to tumor burden and progression.  Unfortunately MRI brain demonstrates clear progression of disease, with novel T2/FLAIR signal surrounding fourth ventricle, and non-enhancing disease continuing to infiltrate across the corpus callosum.  Case was discussed with [facility] team, who also reviewed the recent MRI images.  Recommended transition to carboplatin AUC 4 IV q3 weeks, with concurrent Avastin 10mg /kg IV q2 weeks.  Recommended also continuing IDH-1 inhibitor Vorasidenib at this time, 40mg  daily PO.  He still wants to be aggressive and was agreeable with this plan.  Patient will be pre-medicated for Carboplatin with Dexamethasone, anti-emetics for acute and delayed nausea. Zofran will be provided home use. Will monitor a CBC and CMP prior to chemotherapy and a CBC, CMP, UA and blood pressure prior to bevacizumab.    We recommend  that chemotherapy be held for the following: ANC less than 1,000 Platelets less than 100,000 LFT or creatinine greater than 2x ULN If clinical concerns/contraindications develop   We recommend that bevacizumab be held for the following: ANC less than 500 Platelets less than 50,000 LFT or creatinine greater than 2x ULN Urine protein level greater than or equal to 4, as Avastin can cause nephrotic syndrome.   Will re-image after 2 cycles given rapid and multifocal nature of changes seen on MRI today.  Will con't Lamictal 100mg  daily.  Will add PRN compazine to zofran for nausea regimen.  Ok with Klonopin 0.5mg  BID for anxiety.    We appreciate the opportunity  to participate in the care of Oaklee Moak.    We ask that Joe Fazzina return to clinic in 1 week for initiation of carboplatin+avastin, or sooner as needed.  All questions were answered. The patient knows to call the clinic with any problems, questions or concerns. No barriers to learning were detected.  I have spent a total of 40 minutes of face-to-face and non-face-to-face time, excluding clinical staff time, preparing to see patient, ordering tests and/or medications, counseling the patient, and independently interpreting results and communicating results to the patient/family/caregiver    Henreitta Leber, MD Medical Director of Neuro-Oncology [facility] 04/05/23 2:27 PM

## 2023-04-06 ENCOUNTER — Other Ambulatory Visit: Payer: Self-pay

## 2023-04-07 ENCOUNTER — Encounter: Payer: Self-pay | Admitting: Internal Medicine

## 2023-04-07 ENCOUNTER — Other Ambulatory Visit (HOSPITAL_COMMUNITY): Payer: Self-pay

## 2023-04-09 ENCOUNTER — Other Ambulatory Visit: Payer: Self-pay

## 2023-04-10 NOTE — Telephone Encounter (Signed)
Called patient no answer. Called patient mother contact number Consuella Lose) and she confirmed dates scheduled for patient on 12/19. Patient was unable to answer phone due to work. Mother will relay confirmed appts with patient.

## 2023-04-11 MED FILL — Fosaprepitant Dimeglumine For IV Infusion 150 MG (Base Eq): INTRAVENOUS | Qty: 5 | Status: AC

## 2023-04-11 NOTE — Telephone Encounter (Signed)
Called patient no answer. Patient works and unable to answer call. Call patient mother Consuella Lose) and scheduled appts and Consuella Lose) will convey to patient/son on future appts.

## 2023-04-12 ENCOUNTER — Encounter: Payer: Self-pay | Admitting: General Practice

## 2023-04-12 ENCOUNTER — Inpatient Hospital Stay (HOSPITAL_BASED_OUTPATIENT_CLINIC_OR_DEPARTMENT_OTHER): Payer: Managed Care, Other (non HMO) | Admitting: Internal Medicine

## 2023-04-12 ENCOUNTER — Other Ambulatory Visit (HOSPITAL_COMMUNITY): Payer: Self-pay

## 2023-04-12 ENCOUNTER — Inpatient Hospital Stay: Payer: Managed Care, Other (non HMO)

## 2023-04-12 ENCOUNTER — Other Ambulatory Visit: Payer: Self-pay

## 2023-04-12 VITALS — BP 114/65 | HR 47 | Temp 97.7°F | Resp 20 | Wt 160.2 lb

## 2023-04-12 DIAGNOSIS — C719 Malignant neoplasm of brain, unspecified: Secondary | ICD-10-CM

## 2023-04-12 DIAGNOSIS — C714 Malignant neoplasm of occipital lobe: Secondary | ICD-10-CM | POA: Diagnosis not present

## 2023-04-12 LAB — CMP (CANCER CENTER ONLY)
ALT: 50 U/L — ABNORMAL HIGH (ref 0–44)
AST: 25 U/L (ref 15–41)
Albumin: 4.8 g/dL (ref 3.5–5.0)
Alkaline Phosphatase: 54 U/L (ref 38–126)
Anion gap: 6 (ref 5–15)
BUN: 16 mg/dL (ref 6–20)
CO2: 29 mmol/L (ref 22–32)
Calcium: 9.9 mg/dL (ref 8.9–10.3)
Chloride: 108 mmol/L (ref 98–111)
Creatinine: 1.28 mg/dL — ABNORMAL HIGH (ref 0.61–1.24)
GFR, Estimated: >60 mL/min (ref >60)
Glucose, Bld: 76 mg/dL (ref 70–99)
Potassium: 4.1 mmol/L (ref 3.5–5.1)
Sodium: 143 mmol/L (ref 135–145)
Total Bilirubin: 1.1 mg/dL (ref <1.2)
Total Protein: 6.7 g/dL (ref 6.5–8.1)

## 2023-04-12 LAB — CBC WITH DIFFERENTIAL (CANCER CENTER ONLY)
Abs Immature Granulocytes: 0.01 10*3/uL (ref 0.00–0.07)
Basophils Absolute: 0 10*3/uL (ref 0.0–0.1)
Basophils Relative: 1 %
Eosinophils Absolute: 0 10*3/uL (ref 0.0–0.5)
Eosinophils Relative: 1 %
HCT: 44.1 % (ref 39.0–52.0)
Hemoglobin: 16.2 g/dL (ref 13.0–17.0)
Immature Granulocytes: 0 %
Lymphocytes Relative: 17 %
Lymphs Abs: 0.6 10*3/uL — ABNORMAL LOW (ref 0.7–4.0)
MCH: 35.5 pg — ABNORMAL HIGH (ref 26.0–34.0)
MCHC: 36.7 g/dL — ABNORMAL HIGH (ref 30.0–36.0)
MCV: 96.7 fL (ref 80.0–100.0)
Monocytes Absolute: 0.2 10*3/uL (ref 0.1–1.0)
Monocytes Relative: 5 %
Neutro Abs: 2.8 10*3/uL (ref 1.7–7.7)
Neutrophils Relative %: 76 %
Platelet Count: 100 10*3/uL — ABNORMAL LOW (ref 150–400)
RBC: 4.56 MIL/uL (ref 4.22–5.81)
RDW: 11.2 % — ABNORMAL LOW (ref 11.5–15.5)
WBC Count: 3.6 10*3/uL — ABNORMAL LOW (ref 4.0–10.5)
nRBC: 0 % (ref 0.0–0.2)

## 2023-04-12 LAB — TOTAL PROTEIN, URINE DIPSTICK: Protein, ur: NEGATIVE mg/dL

## 2023-04-12 MED ORDER — CARBOPLATIN CHEMO INJECTION 600 MG/60ML
448.0000 mg | Freq: Once | INTRAVENOUS | Status: AC
  Administered 2023-04-12: 450 mg via INTRAVENOUS
  Filled 2023-04-12: qty 45

## 2023-04-12 MED ORDER — BACLOFEN 5 MG PO TABS
5.0000 mg | ORAL_TABLET | Freq: Three times a day (TID) | ORAL | 0 refills | Status: DC | PRN
  Filled 2023-04-12: qty 60, 20d supply, fill #0

## 2023-04-12 MED ORDER — DEXAMETHASONE SODIUM PHOSPHATE 10 MG/ML IJ SOLN
10.0000 mg | Freq: Once | INTRAMUSCULAR | Status: AC
  Administered 2023-04-12: 10 mg via INTRAVENOUS
  Filled 2023-04-12: qty 1

## 2023-04-12 MED ORDER — PALONOSETRON HCL INJECTION 0.25 MG/5ML
0.2500 mg | Freq: Once | INTRAVENOUS | Status: AC
  Administered 2023-04-12: 0.25 mg via INTRAVENOUS
  Filled 2023-04-12: qty 5

## 2023-04-12 MED ORDER — SODIUM CHLORIDE 0.9 % IV SOLN
10.0000 mg/kg | Freq: Once | INTRAVENOUS | Status: AC
  Administered 2023-04-12: 800 mg via INTRAVENOUS
  Filled 2023-04-12: qty 32

## 2023-04-12 MED ORDER — SODIUM CHLORIDE 0.9 % IV SOLN: INTRAVENOUS | Status: DC

## 2023-04-12 MED ORDER — FOSAPREPITANT DIMEGLUMINE INJECTION 150 MG
150.0000 mg | Freq: Once | INTRAVENOUS | Status: AC
  Administered 2023-04-12: 150 mg via INTRAVENOUS
  Filled 2023-04-12: qty 150

## 2023-04-12 NOTE — Progress Notes (Signed)
[facility] at [facility] [address]  [place], [place] [address] [phone]   Interval Evaluation  Date of Service: 04/12/23 Patient Name: Robert Watkins Patient MRN: [id number] Patient DOB: 19-Oct-1985 Provider: Henreitta Leber, MD  Identifying Statement:  Robert Watkins is a 37 y.o. 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 Barnett Abu in [place]. 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 Lavonna Monarch. Pathology confirms anaplastic astrocytoma.   06/01/2010 Clinical Event-Other  New patient evaluation at [facility] at [facility]. Recommend radiation therapy with concurrent Temozolomide followed by twelve cycles of five-day Temozolomide.   06/13/2010 - 08/01/2010 Radiation  Radiation with Temodar.  06/13/2010 - 08/01/2010 Chemotherapy  Temodar 75 mg/m2 with radiation.  08/16/2010 - 08/07/2011 Chemotherapy  5 day Temodar 200 mg/m2  02/19/2018 Progression  Progression of non-enhancing tumor compared to 2013. Recommend initiating metronomic daily Temodar 50 mg/m2/day.  02/12/20 Progression Initiates Tibsovo 500mg  daily  10/21/20 Progression Adds oral CCNU 90mg /m2 q6 weeks to daily Tibsovo  08/06/22 Progression Repeat IMRT with concurrent Avastin 10mg /kg IV q2 weeks  11/02/22  Transitions to avastin monotherapy   04/12/23 Progression, initiates vorasidenib+carboplatin AUC4 q3 with avastin  Interval History: Robert Watkins presents today for follow up, planned initiation of carboplatin.  Voranigo was started on 03/08/23.  Today he describes continuing issues with nausea, he has been dosing zofran twice per day, compazine not at all yet.  Hiccups have  been somewhat of a problem lately.  Energy level is decreased as well.  Some subjective imbalance is noted. No headaches or seizures.  He remains otherwise active and functionally independent.     Medications: Current Outpatient Medications on File Prior to Visit  Medication Sig Dispense Refill   lamoTRIgine (LAMICTAL) 100 MG tablet Take 1 tablet by mouth once daily 60 tablet 0   vorasidenib (VORANGIO) 40 MG tablet Take 40 mg by mouth daily. 30 tablet 2   atomoxetine (STRATTERA) 40 MG capsule Take 40 mg by mouth daily. (Patient not taking: Reported on 03/01/2023)     clonazePAM (KLONOPIN) 0.5 MG tablet Take 1 tablet (0.5 mg total) by mouth 2 (two) times daily as needed for anxiety. (Patient not taking: Reported on 04/12/2023) 60 tablet 0   ondansetron (ZOFRAN) 8 MG tablet Take 1 tablet (8 mg total) by mouth every 8 (eight) hours as needed for nausea or vomiting. (Patient not taking: Reported on 04/12/2023) 60 tablet 1   prochlorperazine (COMPAZINE) 10 MG tablet Take 1 tablet (10 mg total) by mouth every 6 (six) hours as needed for nausea or vomiting. (Patient not taking: Reported on 04/12/2023) 30 tablet 0   No current facility-administered medications on file prior to visit.    Allergies:  No Active Allergies  Past Medical History:  Past Medical History:  Diagnosis Date   Cancer of brain (HCC) 04/07/2011   Depression, reactive 04/07/2011   History of chemotherapy 06/13/2010-08/01/2010   42 day Temodar with concurrent Radiation   History of chemotherapy 08/16/2010-08/07/2011   5 day Temodar 200 mg/m2   History of radiation therapy 06/13/2010-08/01/2010   [facility]. Recommend radiation therapy with concurrent Temozolomide followed by twelve cycles of five-day Temozolomide. ?   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 Lavonna Monarch. Pathology confirms anaplastic astrocytoma. ?   CRANIOTOMY FOR STEREOTACTIC GUIDED SURGERY Left 05/12/2010   Stereotactic biopsy of left occipital lesion by Dr. Barnett Abu in [place]. Pathology demonstrates anaplastic astrocytoma (WHO III)?   Social History:  Social History   Socioeconomic History   Marital status: Single    Spouse name: Not on file   Number of children: Not on file   Years of education: Not on file   Highest education level: Not on file  Occupational History   Not on file  Tobacco Use   Smoking status: Former   Smokeless tobacco: Never  Substance and Sexual Activity   Alcohol use: Yes    Alcohol/week: 4.0 standard drinks of alcohol    Types: 4 Shots of liquor per week   Drug use: Yes    Types: Marijuana   Sexual activity: Yes  Other Topics Concern   Not on file  Social History Narrative   Not on file   Social Drivers of Health   Financial Resource Strain: Not on file  Food Insecurity: No Food Insecurity (08/21/2022)   Hunger Vital Sign    Worried About Running Out of Food in the Last Year: Never true    Ran Out of Food in the Last Year: Never true  Transportation Needs: No Transportation Needs (08/21/2022)   PRAPARE - Administrator, Civil Service (Medical): No    Lack of Transportation (Non-Medical): No  Physical Activity: Not on file  Stress: Not on file  Social Connections: Not on file  Intimate Partner Violence: Not At Risk (08/21/2022)   Humiliation, Afraid, Rape, and Kick questionnaire    Fear of Current or Ex-Partner: No    Emotionally Abused: No    Physically Abused: No    Sexually Abused: No   Family History: No family history on file.  Review of Systems: Constitutional: Denies fevers, chills or abnormal weight loss Eyes: Denies blurriness of vision Ears, nose, mouth, throat, and face: Denies mucositis or sore  throat Respiratory: Denies cough, dyspnea or wheezes Cardiovascular: Denies palpitation, chest discomfort or lower extremity swelling Gastrointestinal:  Denies nausea, constipation, diarrhea GU: Denies dysuria or incontinence Skin: Denies abnormal skin rashes Neurological: Per HPI Musculoskeletal: Denies joint pain, back or neck discomfort. No decrease in ROM Behavioral/Psych: Denies anxiety, disturbance in thought content, and mood instability  Physical Exam: Vitals:   04/12/23 1420  BP: 114/65  Pulse: (!) 47  Resp: 20  Temp: 97.7 F (36.5 C)  SpO2: 100%    KPS: 80. General: Alert, cooperative, pleasant, in no acute distress Head: Normal EENT: No conjunctival injection or scleral icterus. Oral mucosa moist Lungs: Resp effort normal Cardiac: Regular rate and rhythm Abdomen: Soft, non-distended abdomen Skin: No rashes cyanosis or petechiae. Extremities: No clubbing or edema  Neurologic Exam: Mental Status: Awake, alert, attentive to examiner. Oriented to self and environment. Language is fluent with intact comprehension.  Cranial Nerves: Visual acuity is grossly normal. Visual fields are full. Extra-ocular movements intact. No ptosis. Face is symmetric, tongue midline. Motor: Tone and bulk are normal. Power is full in both arms and legs. Reflexes are symmetric, no pathologic reflexes present. Intact finger to nose bilaterally Sensory: Intact to light touch and temperature Gait: Mild dystaxia   Labs: I have reviewed the data  as listed    Component Value Date/Time   NA 143 04/12/2023 1334   K 4.1 04/12/2023 1334   CL 108 04/12/2023 1334   CO2 29 04/12/2023 1334   GLUCOSE 76 04/12/2023 1334   BUN 16 04/12/2023 1334   CREATININE 1.28 (H) 04/12/2023 1334   CALCIUM 9.9 04/12/2023 1334   PROT 6.7 04/12/2023 1334   ALBUMIN 4.8 04/12/2023 1334   AST 25 04/12/2023 1334   ALT 50 (H) 04/12/2023 1334   ALKPHOS 54 04/12/2023 1334   BILITOT 1.1 04/12/2023 1334   GFRNONAA >60  04/12/2023 1334   GFRAA >60 08/19/2018 0854   Lab Results  Component Value Date   WBC 3.6 (L) 04/12/2023   NEUTROABS 2.8 04/12/2023   HGB 16.2 04/12/2023   HCT 44.1 04/12/2023   MCV 96.7 04/12/2023   PLT 100 (L) 04/12/2023    Imaging:  [facility] Clinician Interpretation: I have personally reviewed the CNS images as listed.  My interpretation, in the context of the patient's clinical presentation, is progressive disease  MR BRAIN W WO CONTRAST Result Date: 04/01/2023 CLINICAL DATA:  Brain/CNS neoplasm, assess treatment response EXAM: MRI HEAD WITHOUT AND WITH CONTRAST TECHNIQUE: Multiplanar, multiecho pulse sequences of the brain and surrounding structures were obtained without and with intravenous contrast. CONTRAST:  7 ml Vueway COMPARISON:  Brain MR 03/01/23, 12/29/22 FINDINGS: Brain: Negative for acute infarct. No hemorrhage. No hydrocephalus. No extra-axial fluid collection. Redemonstrated postsurgical changes with a left parieto-occipital craniotomy with resection cavity in the medial left occipital lobe. The degree of T2/FLAIR hyperintense signal abnormality surrounding the resection cavity has slightly increased, for example along the lateral aspect of the left parietal lobe (series II, image 40). The component along the genu of the corpus callosum and in the left basal ganglia has also increased from prior exam (series 112, image 21, 18). Compared to prior exam there is new/increased T2/FLAIR hyperintense signal abnormality around the fourth ventricle in the medial left cerebellum (series 112, image 8). The degree of nodular contrast enhancement around the left pulmonary venous and periatrial white matter is grossly unchanged in extent. Increased conspicuity of the region of contrast enhancement around the left thalamus (series 119, image 8), which is nonspecific and may be related to technique. Compared to prior exam there is new contrast enhancement along the medial aspect of the of cerebellum.  Vascular: Normal flow voids. Skull and upper cervical spine: Normal marrow signal. Sinuses/Orbits: No middle ear or mastoid effusion. Paranasal sinuses are clear. Orbits are unremarkable. Other: None. IMPRESSION: Overall findings are worrisome for progressive disease with new/increased T2/FLAIR hyperintense signal abnormality around the fourth ventricle and the medial left cerebellum. The degree of T2/FLAIR hyperintense signal abnormality surrounding the resection cavity in the left occipital lobe and along the genu of the corpus callosum has also increased. Electronically Signed   By: Lorenza Cambridge M.D.   On: 04/01/2023 13:50     Assessment/Plan 1. Anaplastic astrocytoma [facility])  Robert Watkins presents for initiation of cycle #1 carboplatin AUC 4 and avastin 10mg /kg IV q3 weeks.  This intervention will be combined with his existing IDH inhibitor, Vorasidenib 40mg  daily.  Ok to treat today with blood counts, relative thrombocytopenia noted.  Patient will be pre-medicated for Carboplatin with Dexamethasone, anti-emetics for acute and delayed nausea. Zofran will be provided home use. Will monitor a CBC and CMP prior to chemotherapy and a CBC, CMP, UA and blood pressure prior to bevacizumab.    We recommend that chemotherapy be held for the  following: ANC less than 1,000 Platelets less than 100,000 LFT or creatinine greater than 2x ULN If clinical concerns/contraindications develop   We recommend that bevacizumab be held for the following: ANC less than 500 Platelets less than 50,000 LFT or creatinine greater than 2x ULN Urine protein level greater than or equal to 4, as Avastin can cause nephrotic syndrome.   Will re-image after 3 cycles given rapid and multifocal nature of changes seen on MRI today.  Will con't Lamictal 100mg  daily.  Will add PRN compazine to zofran for nausea regimen.  Recommended trial of Baclofen 5mg  TID PRN for hiccups.  Ok with Klonopin 0.5mg  BID for anxiety.    We  appreciate the opportunity to participate in the care of Robert Watkins.    We ask that Robert Watkins return to clinic in 3 weeks for cycle #2 of carboplatin+avastin, or sooner as needed.  Labs will also be checked weekly during the first cycle.  All questions were answered. The patient knows to call the clinic with any problems, questions or concerns. No barriers to learning were detected.  I have spent a total of 30 minutes of face-to-face and non-face-to-face time, excluding clinical staff time, preparing to see patient, ordering tests and/or medications, counseling the patient, and independently interpreting results and communicating results to the patient/family/caregiver    Henreitta Leber, MD Medical Director of Neuro-Oncology [facility] 04/12/23 2:34 PM

## 2023-04-12 NOTE — Progress Notes (Signed)
[facility] Spiritual Care Note  Followed up with Robert Watkins in infusion to check in about support resources that might be helpful, meeting his sister Robert Watkins and, at the conclusion of the visit, their mom. Robert Watkins used the opportunity to process mortality and suffering, both at an existential level and in the setting of family communication/dynamics. Reiterated several layers of support available: Spiritual Care, counseling via [facility] Social Work, community counseling referral, Doctor, general practice Group for family members, etc. Robert Watkins and Robert Watkins found it helpful to have a container in which to share and process some of their feelings, concerns, and hopes.  We plan to follow up at [facility] next treatment on 05/03/2023.   [address] [place], [place], [facility] Pager [phone] Voicemail [phone]

## 2023-04-12 NOTE — Patient Instructions (Signed)
[facility] - A DEPT OF [facility]  Discharge Instructions: Thank you for choosing [facility] to provide your oncology and hematology care.   If you have a lab appointment with the Cancer Center, please go directly to the Cancer Center and check in at the registration area.   Wear comfortable clothing and clothing appropriate for easy access to any Portacath or PICC line.   We strive to give you quality time with your provider. You may need to reschedule your appointment if you arrive late (15 or more minutes).  Arriving late affects you and other patients whose appointments are after yours.  Also, if you miss three or more appointments without notifying the office, you may be dismissed from the clinic at the provider's discretion.      For prescription refill requests, have your pharmacy contact our office and allow 72 hours for refills to be completed.    Today you received the following chemotherapy and/or immunotherapy agents: bevacizumab-awwb, carboplatin    To help prevent nausea and vomiting after your treatment, we encourage you to take your nausea medication as directed.  BELOW ARE SYMPTOMS THAT SHOULD BE REPORTED IMMEDIATELY: *FEVER GREATER THAN 100.4 F (38 C) OR HIGHER *CHILLS OR SWEATING *NAUSEA AND VOMITING THAT IS NOT CONTROLLED WITH YOUR NAUSEA MEDICATION *UNUSUAL SHORTNESS OF BREATH *UNUSUAL BRUISING OR BLEEDING *URINARY PROBLEMS (pain or burning when urinating, or frequent urination) *BOWEL PROBLEMS (unusual diarrhea, constipation, pain near the anus) TENDERNESS IN MOUTH AND THROAT WITH OR WITHOUT PRESENCE OF ULCERS (sore throat, sores in mouth, or a toothache) UNUSUAL RASH, SWELLING OR PAIN  UNUSUAL VAGINAL DISCHARGE OR ITCHING   Items with * indicate a potential emergency and should be followed up as soon as possible or go to the Emergency Department if any problems should occur.  Please show the CHEMOTHERAPY ALERT CARD  or IMMUNOTHERAPY ALERT CARD at check-in to the Emergency Department and triage nurse.  Should you have questions after your visit or need to cancel or reschedule your appointment, please contact [facility] - A DEPT OF [facility]  Dept: [phone]  and follow the prompts.  Office hours are 8:00 a.m. to 4:30 p.m. Monday - Friday. Please note that voicemails left after 4:00 p.m. may not be returned until the following business day.  We are closed weekends and major holidays. You have access to a nurse at all times for urgent questions. Please call the main number to the clinic Dept: [phone] and follow the prompts.   For any non-urgent questions, you may also contact your provider using MyChart. We now offer e-Visits for anyone 61 and older to request care online for non-urgent symptoms. For details visit [email or web address].   Also download the MyChart app! Go to the app store, search "MyChart", open the app, select [place], and log in with your MyChart username and password.

## 2023-04-13 ENCOUNTER — Encounter: Payer: Self-pay | Admitting: Internal Medicine

## 2023-04-13 NOTE — Telephone Encounter (Signed)
Called pt to see how he did with his recent treatment.  He reports doing well although some nausea.  Discussed nausea meds & how to take & encouraged to take if needed.  Denies any other problems & states he knows how to reach [place] if needed & knows his next appts.

## 2023-04-13 NOTE — Telephone Encounter (Signed)
Called patient to scheduled 12/26, 1/2 labs.

## 2023-04-13 NOTE — Telephone Encounter (Signed)
-----   Message from Nurse Barbara Cower E sent at 04/12/2023  5:44 PM EST ----- Regarding: first time carbo-vaslow Patient received Carboplatin today for the first time. Seemed to tolerate treatment well with no issue.

## 2023-04-15 ENCOUNTER — Other Ambulatory Visit: Payer: Self-pay

## 2023-04-15 ENCOUNTER — Other Ambulatory Visit: Payer: Self-pay | Admitting: Internal Medicine

## 2023-04-15 DIAGNOSIS — G40909 Epilepsy, unspecified, not intractable, without status epilepticus: Secondary | ICD-10-CM

## 2023-04-16 ENCOUNTER — Encounter: Payer: Self-pay | Admitting: Internal Medicine

## 2023-04-17 ENCOUNTER — Other Ambulatory Visit: Payer: Self-pay | Admitting: Internal Medicine

## 2023-04-17 DIAGNOSIS — C714 Malignant neoplasm of occipital lobe: Secondary | ICD-10-CM

## 2023-04-18 ENCOUNTER — Other Ambulatory Visit: Payer: Self-pay

## 2023-04-19 ENCOUNTER — Inpatient Hospital Stay: Payer: Managed Care, Other (non HMO)

## 2023-04-20 ENCOUNTER — Telehealth: Payer: Self-pay | Admitting: Licensed Clinical Social Worker

## 2023-04-20 NOTE — Telephone Encounter (Signed)
CSW received a message regarding questions pt has about applying for Medicaid and disability. CSW attempted to reach pt. VM was left w/ contact details for CSW.

## 2023-04-23 ENCOUNTER — Other Ambulatory Visit: Payer: Self-pay | Admitting: *Deleted

## 2023-04-23 ENCOUNTER — Other Ambulatory Visit: Payer: Self-pay

## 2023-04-23 ENCOUNTER — Encounter: Payer: Self-pay | Admitting: Internal Medicine

## 2023-04-23 ENCOUNTER — Other Ambulatory Visit (HOSPITAL_COMMUNITY): Payer: Self-pay

## 2023-04-23 MED ORDER — PROCHLORPERAZINE MALEATE 10 MG PO TABS
10.0000 mg | ORAL_TABLET | Freq: Four times a day (QID) | ORAL | 0 refills | Status: DC | PRN
  Filled 2023-04-23 (×2): qty 30, 8d supply, fill #0

## 2023-04-23 MED ORDER — ONDANSETRON HCL 8 MG PO TABS
8.0000 mg | ORAL_TABLET | Freq: Three times a day (TID) | ORAL | 1 refills | Status: DC | PRN
  Filled 2023-04-23: qty 60, 30d supply, fill #0

## 2023-04-24 ENCOUNTER — Other Ambulatory Visit (HOSPITAL_COMMUNITY): Payer: Self-pay

## 2023-04-24 ENCOUNTER — Other Ambulatory Visit: Payer: Self-pay

## 2023-04-24 ENCOUNTER — Inpatient Hospital Stay (HOSPITAL_BASED_OUTPATIENT_CLINIC_OR_DEPARTMENT_OTHER): Payer: Managed Care, Other (non HMO) | Admitting: Internal Medicine

## 2023-04-24 ENCOUNTER — Encounter: Payer: Self-pay | Admitting: Internal Medicine

## 2023-04-24 ENCOUNTER — Encounter: Payer: Self-pay | Admitting: Pharmacist

## 2023-04-24 ENCOUNTER — Other Ambulatory Visit: Payer: Self-pay | Admitting: Internal Medicine

## 2023-04-24 VITALS — BP 128/79 | HR 50 | Temp 97.9°F | Resp 16 | Wt 151.6 lb

## 2023-04-24 DIAGNOSIS — R112 Nausea with vomiting, unspecified: Secondary | ICD-10-CM | POA: Insufficient documentation

## 2023-04-24 DIAGNOSIS — C719 Malignant neoplasm of brain, unspecified: Secondary | ICD-10-CM | POA: Diagnosis not present

## 2023-04-24 DIAGNOSIS — C714 Malignant neoplasm of occipital lobe: Secondary | ICD-10-CM | POA: Diagnosis not present

## 2023-04-24 DIAGNOSIS — G40909 Epilepsy, unspecified, not intractable, without status epilepticus: Secondary | ICD-10-CM

## 2023-04-24 MED ORDER — OLANZAPINE 5 MG PO TABS
5.0000 mg | ORAL_TABLET | Freq: Every day | ORAL | 2 refills | Status: DC
  Filled 2023-04-24 (×2): qty 30, 30d supply, fill #0
  Filled 2023-05-19: qty 30, 30d supply, fill #1

## 2023-04-24 MED ORDER — GRANISETRON 3.1 MG/24HR TD PTCH
MEDICATED_PATCH | TRANSDERMAL | 0 refills | Status: DC
  Filled 2023-04-24: qty 3, 21d supply, fill #0

## 2023-04-24 MED ORDER — DEXAMETHASONE 2 MG PO TABS
2.0000 mg | ORAL_TABLET | Freq: Every day | ORAL | 1 refills | Status: DC
  Filled 2023-04-24 (×2): qty 30, 30d supply, fill #0

## 2023-04-24 MED ORDER — ONDANSETRON HCL 8 MG PO TABS
8.0000 mg | ORAL_TABLET | Freq: Four times a day (QID) | ORAL | 1 refills | Status: DC | PRN
  Filled 2023-04-24 (×2): qty 60, 15d supply, fill #0
  Filled 2023-04-29 – 2023-04-30 (×3): qty 60, 15d supply, fill #1

## 2023-04-24 NOTE — Progress Notes (Signed)
 [facility] at [facility] [address]  [place], [place] [address] [phone]   Interval Evaluation  Date of Service: 04/24/23 Patient Name: Robert Watkins Patient MRN: [id number] Patient DOB: 07-Jan-1986 Provider: Arthea MARLA Manns, MD  Identifying Statement:  Robert Watkins is a 37 y.o. 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 Victory Gens in [place]. 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 Peggye Li. Pathology confirms anaplastic astrocytoma.   06/01/2010 Clinical Event-Other  New patient evaluation at [facility] at [facility]. Recommend radiation therapy with concurrent Temozolomide  followed by twelve cycles of five-day Temozolomide .   06/13/2010 - 08/01/2010 Radiation  Radiation with Temodar .  06/13/2010 - 08/01/2010 Chemotherapy  Temodar  75 mg/m2 with radiation.  08/16/2010 - 08/07/2011 Chemotherapy  5 day Temodar  200 mg/m2  02/19/2018 Progression  Progression of non-enhancing tumor compared to 2013. Recommend initiating metronomic daily Temodar  50 mg/m2/day.  02/12/20 Progression Initiates Tibsovo  500mg  daily  10/21/20 Progression Adds oral CCNU 90mg /m2 q6 weeks to daily Tibsovo   08/06/22 Progression Repeat IMRT with concurrent Avastin  10mg /kg IV q2 weeks  11/02/22  Transitions to avastin  monotherapy   04/12/23 Progression, initiates vorasidenib +carboplatin  AUC4 q3 with avastin   Interval History: Robert Watkins presents today for follow up after recent clinical changes. He describes worsening of nausea and vomiting despite increasing use of both zofran  and compazine .  He has very poor appetite and has lost ~15 or more pounds this month.   Spends much of the day in bed because of feeling sick.     Prior: Voranigo  was started on 03/08/23.  Today he describes continuing issues with nausea, he has been dosing zofran  twice per day, compazine  not at all yet.  Hiccups have been somewhat of a problem lately.  Energy level is decreased as well.  Some subjective imbalance is noted. No headaches or seizures.  He remains otherwise active and functionally independent.     Medications: Current Outpatient Medications on File Prior to Visit  Medication Sig Dispense Refill   atomoxetine (STRATTERA) 40 MG capsule Take 40 mg by mouth daily.     Baclofen  5 MG TABS Take 1 tablet (5 mg total) by mouth 3 (three) times daily as needed. 60 tablet 0   clonazePAM  (KLONOPIN ) 0.5 MG tablet Take 1 tablet (0.5 mg total) by mouth 2 (two) times daily as needed for anxiety. 60 tablet 0   prochlorperazine  (COMPAZINE ) 10 MG tablet Take 1 tablet (10 mg total) by mouth every 6 (six) hours as needed for nausea or vomiting. 30 tablet 0   vorasidenib  (VORANGIO) 40 MG tablet Take 40 mg by mouth daily. 30 tablet 2   No current facility-administered medications on file prior to visit.    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 Recommend radiation therapy with concurrent Temozolomide  followed by twelve cycles of five-day Temozolomide . ?   Seizure disorder, secondary (HCC) 04/07/2011  Past Surgical History:  Past Surgical History:  Procedure Laterality Date   CRANIECTOMY / CRANIOTOMY FOR EXCISION OF BRAIN TUMOR Left 05/26/2010   Left parietooccipital craniotomy with  tumor resection by Dr. Peggye Li. Pathology confirms anaplastic astrocytoma. ?   CRANIOTOMY FOR STEREOTACTIC GUIDED SURGERY Left 05/12/2010   Stereotactic biopsy of left occipital lesion by Dr. Victory Gens in [place]. Pathology demonstrates anaplastic astrocytoma (WHO III)?   Social History:  Social History   Socioeconomic History   Marital status: Single    Spouse name: Not on file   Number of children: Not on file   Years of education: Not on file   Highest education level: Not on file  Occupational History   Not on file  Tobacco Use   Smoking status: Former   Smokeless tobacco: Never  Substance and Sexual Activity   Alcohol use: Yes    Alcohol/week: 4.0 standard drinks of alcohol    Types: 4 Shots of liquor per week   Drug use: Yes    Types: Marijuana   Sexual activity: Yes  Other Topics Concern   Not on file  Social History Narrative   Not on file   Social Drivers of Health   Financial Resource Strain: Not on file  Food Insecurity: No Food Insecurity (08/21/2022)   Hunger Vital Sign    Worried About Running Out of Food in the Last Year: Never true    Ran Out of Food in the Last Year: Never true  Transportation Needs: No Transportation Needs (08/21/2022)   PRAPARE - Administrator, Civil Service (Medical): No    Lack of Transportation (Non-Medical): No  Physical Activity: Not on file  Stress: Not on file  Social Connections: Not on file  Intimate Partner Violence: Not At Risk (08/21/2022)   Humiliation, Afraid, Rape, and Kick questionnaire    Fear of Current or Ex-Partner: No    Emotionally Abused: No    Physically Abused: No    Sexually Abused: No   Family History: No family history on file.  Review of Systems: Constitutional: Denies fevers, chills or abnormal weight loss Eyes: Denies blurriness of vision Ears, nose, mouth, throat, and face: Denies mucositis or sore throat Respiratory: Denies cough, dyspnea or wheezes Cardiovascular:  Denies palpitation, chest discomfort or lower extremity swelling Gastrointestinal:  Denies nausea, constipation, diarrhea GU: Denies dysuria or incontinence Skin: Denies abnormal skin rashes Neurological: Per HPI Musculoskeletal: Denies joint pain, back or neck discomfort. No decrease in ROM Behavioral/Psych: Denies anxiety, disturbance in thought content, and mood instability  Physical Exam: Vitals:   04/24/23 1151  BP: 128/79  Pulse: (!) 50  Resp: 16  Temp: 97.9 F (36.6 C)  SpO2: 99%    KPS: 80. General: Alert, cooperative, pleasant, in no acute distress Head: Normal EENT: No conjunctival injection or scleral icterus. Oral mucosa moist Lungs: Resp effort normal Cardiac: Regular rate and rhythm Abdomen: Soft, non-distended abdomen Skin: No rashes cyanosis or petechiae. Extremities: No clubbing or edema  Neurologic Exam: Mental Status: Awake, alert, attentive to examiner. Oriented to self and environment. Language is fluent with intact comprehension.  Cranial Nerves: Visual acuity is grossly normal. Visual fields are full. Extra-ocular movements intact. No ptosis. Face is symmetric, tongue midline. Motor: Tone and bulk are normal. Power is full in both arms and legs. Reflexes are symmetric, no pathologic reflexes present. Intact finger to nose bilaterally Sensory: Intact to light touch and temperature Gait: Mild dystaxia   Labs: I have reviewed the data  as listed    Component Value Date/Time   NA 143 04/12/2023 1334   K 4.1 04/12/2023 1334   CL 108 04/12/2023 1334   CO2 29 04/12/2023 1334   GLUCOSE 76 04/12/2023 1334   BUN 16 04/12/2023 1334   CREATININE 1.28 (H) 04/12/2023 1334   CALCIUM 9.9 04/12/2023 1334   PROT 6.7 04/12/2023 1334   ALBUMIN 4.8 04/12/2023 1334   AST 25 04/12/2023 1334   ALT 50 (H) 04/12/2023 1334   ALKPHOS 54 04/12/2023 1334   BILITOT 1.1 04/12/2023 1334   GFRNONAA >60 04/12/2023 1334   GFRAA >60 08/19/2018 0854   Lab Results  Component  Value Date   WBC 3.6 (L) 04/12/2023   NEUTROABS 2.8 04/12/2023   HGB 16.2 04/12/2023   HCT 44.1 04/12/2023   MCV 96.7 04/12/2023   PLT 100 (L) 04/12/2023    Imaging:  [facility] Clinician Interpretation: I have personally reviewed the CNS images as listed.  My interpretation, in the context of the patient's clinical presentation, is progressive disease  MR BRAIN W WO CONTRAST Result Date: 04/01/2023 CLINICAL DATA:  Brain/CNS neoplasm, assess treatment response EXAM: MRI HEAD WITHOUT AND WITH CONTRAST TECHNIQUE: Multiplanar, multiecho pulse sequences of the brain and surrounding structures were obtained without and with intravenous contrast. CONTRAST:  7 ml Vueway  COMPARISON:  Brain MR 03/01/23, 12/29/22 FINDINGS: Brain: Negative for acute infarct. No hemorrhage. No hydrocephalus. No extra-axial fluid collection. Redemonstrated postsurgical changes with a left parieto-occipital craniotomy with resection cavity in the medial left occipital lobe. The degree of T2/FLAIR hyperintense signal abnormality surrounding the resection cavity has slightly increased, for example along the lateral aspect of the left parietal lobe (series II, image 40). The component along the genu of the corpus callosum and in the left basal ganglia has also increased from prior exam (series 112, image 21, 18). Compared to prior exam there is new/increased T2/FLAIR hyperintense signal abnormality around the fourth ventricle in the medial left cerebellum (series 112, image 8). The degree of nodular contrast enhancement around the left pulmonary venous and periatrial white matter is grossly unchanged in extent. Increased conspicuity of the region of contrast enhancement around the left thalamus (series 119, image 8), which is nonspecific and may be related to technique. Compared to prior exam there is new contrast enhancement along the medial aspect of the of cerebellum. Vascular: Normal flow voids. Skull and upper cervical spine: Normal marrow  signal. Sinuses/Orbits: No middle ear or mastoid effusion. Paranasal sinuses are clear. Orbits are unremarkable. Other: None. IMPRESSION: Overall findings are worrisome for progressive disease with new/increased T2/FLAIR hyperintense signal abnormality around the fourth ventricle and the medial left cerebellum. The degree of T2/FLAIR hyperintense signal abnormality surrounding the resection cavity in the left occipital lobe and along the genu of the corpus callosum has also increased. Electronically Signed   By: Lyndall Gore M.D.   On: 04/01/2023 13:50     Assessment/Plan 1. Anaplastic astrocytoma (HCC)  Robert Watkins presents today with refractory nausea and vomiting from CNS tumor burden, intracranial pressure affecting CRTZ, as well as chemotherapy associated N/V.    Recommended the following: -Sancuso  patch to replace zofran  -Start zyprexa  5mg  HS -Start decadron  2mg  daily for N/V and appetite -Con't compazine  q6h -Try to increase fluid intake, Gatorlyte -Try to increase activity  Will con't Lamictal  100mg  daily.    Recommended trial of Baclofen  5mg  TID PRN for hiccups.  Ok with Klonopin  0.5mg  BID for anxiety.    We appreciate the opportunity to participate  in the care of Robert Watkins.    We ask that Robert Watkins return to clinic in 2 weeks for cycle #2 of carboplatin +avastin , or sooner as needed.  Labs will also be checked weekly during the first cycle.  All questions were answered. The patient knows to call the clinic with any problems, questions or concerns. No barriers to learning were detected.  I have spent a total of 30 minutes of face-to-face and non-face-to-face time, excluding clinical staff time, preparing to see patient, ordering tests and/or medications, counseling the patient, and independently interpreting results and communicating results to the patient/family/caregiver    Anida Deol K Othell Jaime, MD Medical Director of Neuro-Oncology [facility] at [place]  [place] 04/24/23 2:49 PM

## 2023-04-26 ENCOUNTER — Other Ambulatory Visit: Payer: Self-pay

## 2023-04-26 ENCOUNTER — Inpatient Hospital Stay: Payer: Managed Care, Other (non HMO) | Attending: Internal Medicine

## 2023-04-26 ENCOUNTER — Inpatient Hospital Stay: Payer: Managed Care, Other (non HMO)

## 2023-04-26 ENCOUNTER — Inpatient Hospital Stay: Payer: Managed Care, Other (non HMO) | Admitting: Licensed Clinical Social Worker

## 2023-04-26 DIAGNOSIS — Z5189 Encounter for other specified aftercare: Secondary | ICD-10-CM | POA: Diagnosis not present

## 2023-04-26 DIAGNOSIS — C714 Malignant neoplasm of occipital lobe: Secondary | ICD-10-CM | POA: Diagnosis present

## 2023-04-26 DIAGNOSIS — Z923 Personal history of irradiation: Secondary | ICD-10-CM | POA: Insufficient documentation

## 2023-04-26 DIAGNOSIS — R42 Dizziness and giddiness: Secondary | ICD-10-CM | POA: Diagnosis not present

## 2023-04-26 DIAGNOSIS — Z79899 Other long term (current) drug therapy: Secondary | ICD-10-CM | POA: Diagnosis not present

## 2023-04-26 DIAGNOSIS — R262 Difficulty in walking, not elsewhere classified: Secondary | ICD-10-CM | POA: Insufficient documentation

## 2023-04-26 DIAGNOSIS — Z5111 Encounter for antineoplastic chemotherapy: Secondary | ICD-10-CM | POA: Diagnosis not present

## 2023-04-26 DIAGNOSIS — R112 Nausea with vomiting, unspecified: Secondary | ICD-10-CM | POA: Diagnosis not present

## 2023-04-26 DIAGNOSIS — G40909 Epilepsy, unspecified, not intractable, without status epilepticus: Secondary | ICD-10-CM | POA: Diagnosis not present

## 2023-04-26 DIAGNOSIS — Z7952 Long term (current) use of systemic steroids: Secondary | ICD-10-CM | POA: Insufficient documentation

## 2023-04-26 DIAGNOSIS — R066 Hiccough: Secondary | ICD-10-CM | POA: Diagnosis not present

## 2023-04-26 DIAGNOSIS — Z87891 Personal history of nicotine dependence: Secondary | ICD-10-CM | POA: Insufficient documentation

## 2023-04-26 DIAGNOSIS — C719 Malignant neoplasm of brain, unspecified: Secondary | ICD-10-CM

## 2023-04-26 LAB — CBC WITH DIFFERENTIAL (CANCER CENTER ONLY)
Abs Immature Granulocytes: 0.02 10*3/uL (ref 0.00–0.07)
Basophils Absolute: 0 10*3/uL (ref 0.0–0.1)
Basophils Relative: 1 %
Eosinophils Absolute: 0.1 10*3/uL (ref 0.0–0.5)
Eosinophils Relative: 1 %
HCT: 46.8 % (ref 39.0–52.0)
Hemoglobin: 17.4 g/dL — ABNORMAL HIGH (ref 13.0–17.0)
Immature Granulocytes: 1 %
Lymphocytes Relative: 16 %
Lymphs Abs: 0.7 10*3/uL (ref 0.7–4.0)
MCH: 35.2 pg — ABNORMAL HIGH (ref 26.0–34.0)
MCHC: 37.2 g/dL — ABNORMAL HIGH (ref 30.0–36.0)
MCV: 94.5 fL (ref 80.0–100.0)
Monocytes Absolute: 0.2 10*3/uL (ref 0.1–1.0)
Monocytes Relative: 5 %
Neutro Abs: 3.4 10*3/uL (ref 1.7–7.7)
Neutrophils Relative %: 76 %
Platelet Count: 128 10*3/uL — ABNORMAL LOW (ref 150–400)
RBC: 4.95 MIL/uL (ref 4.22–5.81)
RDW: 11.4 % — ABNORMAL LOW (ref 11.5–15.5)
WBC Count: 4.4 10*3/uL (ref 4.0–10.5)
nRBC: 0 % (ref 0.0–0.2)

## 2023-04-26 LAB — CMP (CANCER CENTER ONLY)
ALT: 36 U/L (ref 0–44)
AST: 23 U/L (ref 15–41)
Albumin: 5 g/dL (ref 3.5–5.0)
Alkaline Phosphatase: 67 U/L (ref 38–126)
Anion gap: 4 — ABNORMAL LOW (ref 5–15)
BUN: 16 mg/dL (ref 6–20)
CO2: 34 mmol/L — ABNORMAL HIGH (ref 22–32)
Calcium: 10.2 mg/dL (ref 8.9–10.3)
Chloride: 105 mmol/L (ref 98–111)
Creatinine: 1.61 mg/dL — ABNORMAL HIGH (ref 0.61–1.24)
GFR, Estimated: 57 mL/min — ABNORMAL LOW (ref >60)
Glucose, Bld: 91 mg/dL (ref 70–99)
Potassium: 3.9 mmol/L (ref 3.5–5.1)
Sodium: 143 mmol/L (ref 135–145)
Total Bilirubin: 0.9 mg/dL (ref 0.0–1.2)
Total Protein: 7.6 g/dL (ref 6.5–8.1)

## 2023-04-26 NOTE — Telephone Encounter (Signed)
 Patient mother called Consuella Lose) patient missed lab appt, reschedule for the same day, advise Consuella Lose) have ava for 1115. Appt reschedule for patient.

## 2023-04-26 NOTE — Progress Notes (Signed)
 [facility] CSW Progress Note  Visual Merchandiser  met w/ pt's father to discuss concerns regarding progression of disease.  Per pt's father, pt has had a significant decline which occurred unexpectedly the week before Christmas.  Pt's cognitive ability has been slightly impacted.  Pt's father with questions regarding completing POA documents as well as communicating w/ pt's employer.  CSW provided pt's father w/ contact information for a lawyer to discuss completing POA paperwork.  Extensive discussion regarding short/long term disability benefits, insurance and social security disability.  CSW then met w/ pt.  Referral signed for the [facility] to start the application for social security disability.  Pt to provide his father w/ contact details for his supervisor at work.  CSW to contact pt's employer w/ his father on 1/3 to inquire about what benefits pt is eligible for.        Devere JONELLE Manna, LCSW Clinical Social Worker [facility]

## 2023-04-27 ENCOUNTER — Other Ambulatory Visit (HOSPITAL_COMMUNITY): Payer: Self-pay

## 2023-04-27 ENCOUNTER — Inpatient Hospital Stay: Payer: Managed Care, Other (non HMO) | Admitting: Licensed Clinical Social Worker

## 2023-04-27 DIAGNOSIS — C719 Malignant neoplasm of brain, unspecified: Secondary | ICD-10-CM

## 2023-04-27 NOTE — Progress Notes (Signed)
 [facility] CSW Progress Note  Visual Merchandiser  met w/ pt's father and called [employer] to file a new disability claim through pt's employer.  Pt will need to file a new claim to determine what benefits he has available through his employer as he has used time intermittently in the past year.  Pt's father will be meeting with a lawyer today to obtain a POA.  CSW to remain available as appropriate throughout duration of treatment to provide support.        Devere JONELLE Manna, LCSW Clinical Social Worker [facility]

## 2023-04-28 ENCOUNTER — Other Ambulatory Visit (HOSPITAL_COMMUNITY): Payer: Self-pay

## 2023-04-29 ENCOUNTER — Other Ambulatory Visit (HOSPITAL_COMMUNITY): Payer: Self-pay

## 2023-04-30 ENCOUNTER — Other Ambulatory Visit (HOSPITAL_COMMUNITY): Payer: Self-pay

## 2023-04-30 ENCOUNTER — Encounter: Payer: Self-pay | Admitting: Internal Medicine

## 2023-04-30 ENCOUNTER — Other Ambulatory Visit: Payer: Self-pay

## 2023-04-30 ENCOUNTER — Other Ambulatory Visit: Payer: Self-pay | Admitting: Internal Medicine

## 2023-04-30 MED ORDER — PROCHLORPERAZINE MALEATE 10 MG PO TABS
10.0000 mg | ORAL_TABLET | Freq: Four times a day (QID) | ORAL | 0 refills | Status: DC | PRN
  Filled 2023-04-30 – 2023-05-02 (×2): qty 30, 8d supply, fill #0

## 2023-04-30 NOTE — Progress Notes (Signed)
 Specialty Pharmacy Refill Coordination Note  Robert Watkins is a 38 y.o. male contacted today regarding refills of specialty medication(s) Vorasidenib  BERNARDINE)   Patient requested [name] at [facility] at [place] date: 05/02/23   Medication will be filled on 05/01/23.

## 2023-05-01 ENCOUNTER — Other Ambulatory Visit (HOSPITAL_COMMUNITY): Payer: Self-pay

## 2023-05-01 ENCOUNTER — Encounter: Payer: Self-pay | Admitting: Pharmacist

## 2023-05-01 ENCOUNTER — Other Ambulatory Visit: Payer: Self-pay

## 2023-05-02 ENCOUNTER — Telehealth: Payer: Self-pay | Admitting: Pharmacy Technician

## 2023-05-02 ENCOUNTER — Other Ambulatory Visit (HOSPITAL_COMMUNITY): Payer: Self-pay

## 2023-05-02 ENCOUNTER — Other Ambulatory Visit: Payer: Self-pay

## 2023-05-02 ENCOUNTER — Encounter: Payer: Self-pay | Admitting: Internal Medicine

## 2023-05-02 MED FILL — Fosaprepitant Dimeglumine For IV Infusion 150 MG (Base Eq): INTRAVENOUS | Qty: 5 | Status: AC

## 2023-05-02 NOTE — Progress Notes (Signed)
 Enrolled patient in copay card program for Voranigo . New copay is $25 after assistance is applied. I have left a vm with the patient to discuss the change in pricing.  Estefana Moellers, CPhT-Adv Oncology Pharmacy Patient Advocate [facility] Direct Number: [phone]  Fax: [phone]

## 2023-05-02 NOTE — Telephone Encounter (Signed)
 Oral Oncology Patient Advocate Encounter   Was successful in obtaining a copay card for Voranigo .  This copay card will make the patients copay $25.  I have spoken with the patient.    The billing information is as follows and has been shared with [facility].   RxBin: [id number] PCN: [id number] Member ID: [id number] Group ID: [id number]   Estefana Moellers, CPhT-Adv Oncology Pharmacy Patient Advocate [facility] Direct Number: [phone]  Fax: [phone]

## 2023-05-03 ENCOUNTER — Inpatient Hospital Stay (HOSPITAL_BASED_OUTPATIENT_CLINIC_OR_DEPARTMENT_OTHER): Payer: Managed Care, Other (non HMO) | Admitting: Internal Medicine

## 2023-05-03 ENCOUNTER — Encounter: Payer: Self-pay | Admitting: Internal Medicine

## 2023-05-03 ENCOUNTER — Other Ambulatory Visit (HOSPITAL_COMMUNITY): Payer: Self-pay

## 2023-05-03 ENCOUNTER — Encounter: Payer: Self-pay | Admitting: General Practice

## 2023-05-03 ENCOUNTER — Inpatient Hospital Stay: Payer: Managed Care, Other (non HMO)

## 2023-05-03 ENCOUNTER — Other Ambulatory Visit: Payer: Self-pay

## 2023-05-03 VITALS — BP 115/81 | HR 67 | Temp 97.9°F | Resp 16 | Ht 71.0 in | Wt 154.0 lb

## 2023-05-03 DIAGNOSIS — C714 Malignant neoplasm of occipital lobe: Secondary | ICD-10-CM | POA: Diagnosis not present

## 2023-05-03 DIAGNOSIS — R112 Nausea with vomiting, unspecified: Secondary | ICD-10-CM | POA: Diagnosis not present

## 2023-05-03 DIAGNOSIS — C719 Malignant neoplasm of brain, unspecified: Secondary | ICD-10-CM

## 2023-05-03 LAB — CMP (CANCER CENTER ONLY)
ALT: 46 U/L — ABNORMAL HIGH (ref 0–44)
AST: 29 U/L (ref 15–41)
Albumin: 4.4 g/dL (ref 3.5–5.0)
Alkaline Phosphatase: 54 U/L (ref 38–126)
Anion gap: 4 — ABNORMAL LOW (ref 5–15)
BUN: 18 mg/dL (ref 6–20)
CO2: 31 mmol/L (ref 22–32)
Calcium: 9.6 mg/dL (ref 8.9–10.3)
Chloride: 108 mmol/L (ref 98–111)
Creatinine: 1.32 mg/dL — ABNORMAL HIGH (ref 0.61–1.24)
GFR, Estimated: >60 mL/min (ref >60)
Glucose, Bld: 97 mg/dL (ref 70–99)
Potassium: 3.7 mmol/L (ref 3.5–5.1)
Sodium: 143 mmol/L (ref 135–145)
Total Bilirubin: 0.5 mg/dL (ref 0.0–1.2)
Total Protein: 6.6 g/dL (ref 6.5–8.1)

## 2023-05-03 LAB — CBC WITH DIFFERENTIAL (CANCER CENTER ONLY)
Abs Immature Granulocytes: 0.01 10*3/uL (ref 0.00–0.07)
Basophils Absolute: 0 10*3/uL (ref 0.0–0.1)
Basophils Relative: 0 %
Eosinophils Absolute: 0 10*3/uL (ref 0.0–0.5)
Eosinophils Relative: 1 %
HCT: 40.4 % (ref 39.0–52.0)
Hemoglobin: 15 g/dL (ref 13.0–17.0)
Immature Granulocytes: 0 %
Lymphocytes Relative: 13 %
Lymphs Abs: 0.5 10*3/uL — ABNORMAL LOW (ref 0.7–4.0)
MCH: 34.9 pg — ABNORMAL HIGH (ref 26.0–34.0)
MCHC: 37.1 g/dL — ABNORMAL HIGH (ref 30.0–36.0)
MCV: 94 fL (ref 80.0–100.0)
Monocytes Absolute: 0.2 10*3/uL (ref 0.1–1.0)
Monocytes Relative: 5 %
Neutro Abs: 3.4 10*3/uL (ref 1.7–7.7)
Neutrophils Relative %: 81 %
Platelet Count: 85 10*3/uL — ABNORMAL LOW (ref 150–400)
RBC: 4.3 MIL/uL (ref 4.22–5.81)
RDW: 12 % (ref 11.5–15.5)
WBC Count: 4.2 10*3/uL (ref 4.0–10.5)
nRBC: 0 % (ref 0.0–0.2)

## 2023-05-03 MED ORDER — SODIUM CHLORIDE 0.9 % IV SOLN
437.6000 mg | Freq: Once | INTRAVENOUS | Status: AC
  Administered 2023-05-03: 440 mg via INTRAVENOUS
  Filled 2023-05-03: qty 44

## 2023-05-03 MED ORDER — PROCHLORPERAZINE MALEATE 10 MG PO TABS
10.0000 mg | ORAL_TABLET | Freq: Four times a day (QID) | ORAL | 2 refills | Status: DC | PRN
  Filled 2023-05-03 – 2023-05-11 (×2): qty 60, 15d supply, fill #0
  Filled 2023-05-21: qty 60, 15d supply, fill #1

## 2023-05-03 MED ORDER — SODIUM CHLORIDE 0.9 % IV SOLN
700.0000 mg | Freq: Once | INTRAVENOUS | Status: AC
  Administered 2023-05-03: 700 mg via INTRAVENOUS
  Filled 2023-05-03: qty 16

## 2023-05-03 MED ORDER — DEXAMETHASONE 2 MG PO TABS
2.0000 mg | ORAL_TABLET | Freq: Every day | ORAL | 1 refills | Status: DC
  Filled 2023-05-03: qty 60, 60d supply, fill #0
  Filled 2023-05-21: qty 60, 30d supply, fill #0

## 2023-05-03 MED ORDER — DEXAMETHASONE SODIUM PHOSPHATE 10 MG/ML IJ SOLN
10.0000 mg | Freq: Once | INTRAMUSCULAR | Status: AC
  Administered 2023-05-03: 10 mg via INTRAVENOUS
  Filled 2023-05-03: qty 1

## 2023-05-03 MED ORDER — ONDANSETRON HCL 8 MG PO TABS
8.0000 mg | ORAL_TABLET | Freq: Four times a day (QID) | ORAL | 2 refills | Status: DC | PRN
  Filled 2023-05-03 – 2023-05-04 (×2): qty 60, 15d supply, fill #0

## 2023-05-03 MED ORDER — SODIUM CHLORIDE 0.9 % IV SOLN
150.0000 mg | Freq: Once | INTRAVENOUS | Status: AC
  Administered 2023-05-03: 150 mg via INTRAVENOUS
  Filled 2023-05-03: qty 150

## 2023-05-03 MED ORDER — SODIUM CHLORIDE 0.9 % IV SOLN
INTRAVENOUS | Status: DC
Start: 2023-05-03 — End: 2023-05-03

## 2023-05-03 MED ORDER — PALONOSETRON HCL INJECTION 0.25 MG/5ML
0.2500 mg | Freq: Once | INTRAVENOUS | Status: AC
  Administered 2023-05-03: 0.25 mg via INTRAVENOUS
  Filled 2023-05-03: qty 5

## 2023-05-03 MED ORDER — MECLIZINE HCL 25 MG PO TABS
25.0000 mg | ORAL_TABLET | Freq: Three times a day (TID) | ORAL | 0 refills | Status: DC | PRN
  Filled 2023-05-03 – 2023-05-04 (×2): qty 30, 10d supply, fill #0

## 2023-05-03 NOTE — Patient Instructions (Signed)
 [facility] - A DEPT OF [facility]  Discharge Instructions: Thank you for choosing [facility] to provide your oncology and hematology care.   If you have a lab appointment with the Cancer Center, please go directly to the Cancer Center and check in at the registration area.   Wear comfortable clothing and clothing appropriate for easy access to any Portacath or PICC line.   We strive to give you quality time with your provider. You may need to reschedule your appointment if you arrive late (15 or more minutes).  Arriving late affects you and other patients whose appointments are after yours.  Also, if you miss three or more appointments without notifying the office, you may be dismissed from the clinic at the provider's discretion.      For prescription refill requests, have your pharmacy contact our office and allow 72 hours for refills to be completed.    Today you received the following chemotherapy and/or immunotherapy agents: bevacizumab-awwb, carboplatin    To help prevent nausea and vomiting after your treatment, we encourage you to take your nausea medication as directed.  BELOW ARE SYMPTOMS THAT SHOULD BE REPORTED IMMEDIATELY: *FEVER GREATER THAN 100.4 F (38 C) OR HIGHER *CHILLS OR SWEATING *NAUSEA AND VOMITING THAT IS NOT CONTROLLED WITH YOUR NAUSEA MEDICATION *UNUSUAL SHORTNESS OF BREATH *UNUSUAL BRUISING OR BLEEDING *URINARY PROBLEMS (pain or burning when urinating, or frequent urination) *BOWEL PROBLEMS (unusual diarrhea, constipation, pain near the anus) TENDERNESS IN MOUTH AND THROAT WITH OR WITHOUT PRESENCE OF ULCERS (sore throat, sores in mouth, or a toothache) UNUSUAL RASH, SWELLING OR PAIN  UNUSUAL VAGINAL DISCHARGE OR ITCHING   Items with * indicate a potential emergency and should be followed up as soon as possible or go to the Emergency Department if any problems should occur.  Please show the CHEMOTHERAPY ALERT CARD  or IMMUNOTHERAPY ALERT CARD at check-in to the Emergency Department and triage nurse.  Should you have questions after your visit or need to cancel or reschedule your appointment, please contact [facility] - A DEPT OF [facility]  Dept: [phone]  and follow the prompts.  Office hours are 8:00 a.m. to 4:30 p.m. Monday - Friday. Please note that voicemails left after 4:00 p.m. may not be returned until the following business day.  We are closed weekends and major holidays. You have access to a nurse at all times for urgent questions. Please call the main number to the clinic Dept: [phone] and follow the prompts.   For any non-urgent questions, you may also contact your provider using MyChart. We now offer e-Visits for anyone 61 and older to request care online for non-urgent symptoms. For details visit [email or web address].   Also download the MyChart app! Go to the app store, search "MyChart", open the app, select [place], and log in with your MyChart username and password.

## 2023-05-03 NOTE — Progress Notes (Signed)
 Received copies of Advance Directives from patient. Sent to HIM for scanning

## 2023-05-03 NOTE — Progress Notes (Signed)
 Adjust bevacizumab for todays weight per Dr Barbaraann Cao

## 2023-05-03 NOTE — Progress Notes (Signed)
 [facility] Spiritual Care Note  Visited with Robert Watkins and his sister Robert Watkins in infusion for follow-up support. Robert Watkins is just beginning to process that he may never be able to drive again; such a loss of independence and freedom of movement will be a source of grief that requires significant life adjustment.   Provided compassionate presence, empathic listening, emotional support, and normalization of feelings. We plan to follow up at a future treatment.   [address] Robert Watkins, [place], [facility] Pager [phone] Voicemail [phone]

## 2023-05-03 NOTE — Progress Notes (Signed)
 [facility] [address]  [place], [place] [address] [phone]   Interval Evaluation  Date of Service: 05/03/23 Patient Name: Robert Watkins Patient MRN: [id number] Patient DOB: [birth year] Provider: Arthea MARLA Manns, MD  Identifying Statement:  Robert Watkins is a 38 y.o. 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 Victory Gens in [place]. 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 Peggye Li. Pathology confirms anaplastic astrocytoma.   06/01/2010 Clinical Event-Other  New patient evaluation at [facility] Recommend radiation therapy with concurrent Temozolomide  followed by twelve cycles of five-day Temozolomide .   06/13/2010 - 08/01/2010 Radiation  Radiation with Temodar .  06/13/2010 - 08/01/2010 Chemotherapy  Temodar  75 mg/m2 with radiation.  08/16/2010 - 08/07/2011 Chemotherapy  5 day Temodar  200 mg/m2  02/19/2018 Progression  Progression of non-enhancing tumor compared to 2013. Recommend initiating metronomic daily Temodar  50 mg/m2/day.  02/12/20 Progression Initiates Tibsovo  500mg  daily  10/21/20 Progression Adds oral CCNU 90mg /m2 q6 weeks to daily Tibsovo   08/06/22 Progression Repeat IMRT with concurrent Avastin  10mg /kg IV q2 weeks  11/02/22  Transitions to avastin  monotherapy   04/12/23 Progression, initiates vorasidenib +carboplatin  AUC4 q3 with avastin    Interval History: Robert Watkins presents today for planned carboplatin  and avastin  infusions.  He describes some improvement in nausea and vomiting since alternating both zofran  and compazine ; also utilizing zyprex and decadron  daily.  Appetite is improved as  well.  He does complain of worsening dizziness and frank vertigo, room spinning, since prior visit.  More difficulties with reading, he is no longer able to pay his own bills.  Sleeping up to 20 hours per day, although he was up for 8 hours last Sunday.   Prior: Voranigo  was started on 03/08/23.  Today he describes continuing issues with nausea, he has been dosing zofran  twice per day, compazine  not at all yet.  Hiccups have been somewhat of a problem lately.  Energy level is decreased as well.  Some subjective imbalance is noted. No headaches or seizures.  He remains otherwise active and functionally independent.     Medications: Current Outpatient Medications on File Prior to Visit  Medication Sig Dispense Refill   atomoxetine (STRATTERA) 40 MG capsule Take 40 mg by mouth daily.     Baclofen  5 MG TABS Take 1 tablet (5 mg total) by mouth 3 (three) times daily as needed. 60 tablet 0   clonazePAM  (KLONOPIN ) 0.5 MG tablet Take 1 tablet (0.5 mg total) by mouth 2 (two) times daily as needed for anxiety. 60 tablet 0   dexamethasone  (DECADRON ) 2 MG tablet Take 1 tablet (2 mg total) by mouth daily. 30 tablet 1   granisetron  (SANCUSO ) 3.1 MG/24HR Apply to skin starting 24 hours before chemotherapy. Remove after 7 days. 3 each 0   lamoTRIgine  (LAMICTAL ) 100 MG tablet Take 1 tablet by mouth once daily 60 tablet 0   OLANZapine  (ZYPREXA ) 5 MG tablet Take 1 tablet (5 mg total) by mouth at bedtime. 30 tablet 2   ondansetron  (ZOFRAN ) 8 MG tablet Take 1 tablet (8 mg total) by mouth every 6 (six) hours as needed for nausea or vomiting. 60 tablet 1   prochlorperazine  (COMPAZINE ) 10 MG tablet Take 1 tablet (10 mg total) by mouth every 6 (six)  hours as needed for nausea or vomiting. 30 tablet 0   vorasidenib  (VORANGIO) 40 MG tablet Take 40 mg by mouth daily. 30 tablet 2   No current facility-administered medications on file prior to visit.    Allergies:  No Active Allergies  Past Medical History:  Past Medical  History:  Diagnosis Date   Cancer of brain (HCC) 04/07/2011   Depression, reactive 04/07/2011   History of chemotherapy 06/13/2010-08/01/2010   42 day Temodar  with concurrent Radiation   History of chemotherapy 08/16/2010-08/07/2011   5 day Temodar  200 mg/m2   History of radiation therapy 06/13/2010-08/01/2010   [facility]. Recommend radiation therapy with concurrent Temozolomide  followed by twelve cycles of five-day Temozolomide . ?   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 Peggye Li. Pathology confirms anaplastic astrocytoma. ?   CRANIOTOMY FOR STEREOTACTIC GUIDED SURGERY Left 05/12/2010   Stereotactic biopsy of left occipital lesion by Dr. Victory Gens in [place]. Pathology demonstrates anaplastic astrocytoma (WHO III)?   Social History:  Social History   Socioeconomic History   Marital status: Single    Spouse name: Not on file   Number of children: Not on file   Years of education: Not on file   Highest education level: Not on file  Occupational History   Not on file  Tobacco Use   Smoking status: Former   Smokeless tobacco: Never  Substance and Sexual Activity   Alcohol use: Yes    Alcohol/week: 4.0 standard drinks of alcohol    Types: 4 Shots of liquor per week   Drug use: Yes    Types: Marijuana   Sexual activity: Yes  Other Topics Concern   Not on file  Social History Narrative   Not on file   Social Drivers of Health   Financial Resource Strain: Not on file  Food Insecurity: No Food Insecurity (08/21/2022)   Hunger Vital Sign    Worried About Running Out of Food in the Last Year: Never true    Ran Out of Food in the Last Year: Never true  Transportation Needs: No Transportation Needs (08/21/2022)    PRAPARE - Administrator, Civil Service (Medical): No    Lack of Transportation (Non-Medical): No  Physical Activity: Not on file  Stress: Not on file  Social Connections: Not on file  Intimate Partner Violence: Not At Risk (08/21/2022)   Humiliation, Afraid, Rape, and Kick questionnaire    Fear of Current or Ex-Partner: No    Emotionally Abused: No    Physically Abused: No    Sexually Abused: No   Family History: No family history on file.  Review of Systems: Constitutional: Denies fevers, chills or abnormal weight loss Eyes: Denies blurriness of vision Ears, nose, mouth, throat, and face: Denies mucositis or sore throat Respiratory: Denies cough, dyspnea or wheezes Cardiovascular: Denies palpitation, chest discomfort or lower extremity swelling Gastrointestinal:  Denies nausea, constipation, diarrhea GU: Denies dysuria or incontinence Skin: Denies abnormal skin rashes Neurological: Per HPI Musculoskeletal: Denies joint pain, back or neck discomfort. No decrease in ROM Behavioral/Psych: Denies anxiety, disturbance in thought content, and mood instability  Physical Exam: Vitals:   05/03/23 1400  BP: 115/81  Pulse: 67  Resp: 16  Temp: 97.9 F (36.6 C)  SpO2: 100%  KPS: 70. General: Alert, cooperative, pleasant, in no acute distress Head: Normal EENT: No conjunctival injection or scleral icterus. Oral mucosa moist Lungs: Resp effort normal Cardiac: Regular rate and rhythm Abdomen: Soft, non-distended abdomen Skin: No rashes cyanosis or petechiae. Extremities: No clubbing or edema  Neurologic Exam: Mental Status: Awake, alert, attentive to examiner. Oriented to self and environment. Language is impaired with regards to comprehension.  Age advanced psychomotor slowing. Cranial Nerves: Visual acuity is grossly normal. Visual fields are full. Extra-ocular movements intact. No ptosis. Face is symmetric, tongue midline. Motor: Tone and bulk are normal. Power  is 4+/5 in right arm. Reflexes are symmetric, no pathologic reflexes present. Intact finger to nose bilaterally Sensory: Intact to light touch and temperature Gait: Mild dystaxia   Labs: I have reviewed the data as listed    Component Value Date/Time   NA 143 04/26/2023 1133   K 3.9 04/26/2023 1133   CL 105 04/26/2023 1133   CO2 34 (H) 04/26/2023 1133   GLUCOSE 91 04/26/2023 1133   BUN 16 04/26/2023 1133   CREATININE 1.61 (H) 04/26/2023 1133   CALCIUM 10.2 04/26/2023 1133   PROT 7.6 04/26/2023 1133   ALBUMIN 5.0 04/26/2023 1133   AST 23 04/26/2023 1133   ALT 36 04/26/2023 1133   ALKPHOS 67 04/26/2023 1133   BILITOT 0.9 04/26/2023 1133   GFRNONAA 57 (L) 04/26/2023 1133   GFRAA >60 08/19/2018 0854   Lab Results  Component Value Date   WBC 4.2 05/03/2023   NEUTROABS 3.4 05/03/2023   HGB 15.0 05/03/2023   HCT 40.4 05/03/2023   MCV 94.0 05/03/2023   PLT 85 (L) 05/03/2023     Assessment/Plan 1. Anaplastic astrocytoma (HCC)  Robert Watkins presents today for cycle #2 carboplatin  AUC 4 and avastin  10mg /kg IV q3 weeks.  This intervention will be combined with his existing IDH inhibitor, Vorasidenib  40mg  daily.   Ok to treat today with blood counts, relative thrombocytopenia noted.   Patient will be pre-medicated for Carboplatin  with Dexamethasone , anti-emetics for acute and delayed nausea. Zofran  will be provided home use. Will monitor a CBC and CMP prior to chemotherapy and a CBC, CMP, UA and blood pressure prior to bevacizumab .    We recommend that chemotherapy be held for the following: ANC less than 1,000 Platelets less than 100,000 LFT or creatinine greater than 2x ULN If clinical concerns/contraindications develop   We recommend that bevacizumab  be held for the following: ANC less than 500 Platelets less than 50,000 LFT or creatinine greater than 2x ULN Urine protein level greater than or equal to 4, as Avastin  can cause nephrotic syndrome.   For nausea, should  con't zyprexa  5mg  HS in addition to alternating zofran  and compazine .  Decadron  will con't at 2mg  daily.  Recommended trial of Meclizine  25mg  q8 PRN for vertigo symptoms.  Will con't Lamictal  100mg  daily.    May con't Baclofen  5mg  TID PRN for hiccups.  Ok with Klonopin  0.5mg  BID for anxiety.    Given progressive and refractory symptoms, will push up his MRI to prior to next cycle.  We appreciate the opportunity to participate in the care of Robert Watkins.    We ask that Robert Watkins return to clinic in 3 weeks for cycle #3 of carboplatin +avastin , or sooner as needed.  Will plan to have MRI brain for review as well.  All questions were answered. The patient knows to call the clinic with any problems, questions or concerns. No barriers to learning were detected.  I  have spent a total of 40 minutes of face-to-face and non-face-to-face time, excluding clinical staff time, preparing to see patient, ordering tests and/or medications, counseling the patient, and independently interpreting results and communicating results to the patient/family/caregiver    Robert Devita K Madeline Pho, MD Medical Director of Neuro-Oncology [facility] 05/03/23 1:50 PM

## 2023-05-04 ENCOUNTER — Encounter (HOSPITAL_COMMUNITY): Payer: Self-pay

## 2023-05-04 ENCOUNTER — Encounter: Payer: Self-pay | Admitting: Internal Medicine

## 2023-05-04 ENCOUNTER — Other Ambulatory Visit (HOSPITAL_COMMUNITY): Payer: Self-pay

## 2023-05-07 ENCOUNTER — Other Ambulatory Visit: Payer: Self-pay

## 2023-05-11 ENCOUNTER — Other Ambulatory Visit (HOSPITAL_COMMUNITY): Payer: Self-pay

## 2023-05-14 ENCOUNTER — Telehealth: Payer: Self-pay | Admitting: *Deleted

## 2023-05-14 NOTE — Telephone Encounter (Signed)
Patients father called concerned about decline since Thursday.  Patient hasn't had any solid foods only small sips of fluids since Thursday due to severe nausea/fatigue.  He is alternating between Zofran/Compazine every 3 hours in addition to Zyprexa @HS .  Unable to dose decadron because no food on stomach.  Last Carbo/Avastin was 05/03/2023.  Daily Vorasidenib.    Scheduled visit with Dr Barbaraann Cao for following day with [facility] infusion to follow to assist with IVFs plus antiemetic.  Those orders will be added after Dr Barbaraann Cao sees the patient.

## 2023-05-15 ENCOUNTER — Inpatient Hospital Stay (HOSPITAL_BASED_OUTPATIENT_CLINIC_OR_DEPARTMENT_OTHER): Payer: Managed Care, Other (non HMO) | Admitting: Internal Medicine

## 2023-05-15 ENCOUNTER — Inpatient Hospital Stay: Payer: Managed Care, Other (non HMO)

## 2023-05-15 VITALS — BP 130/89 | HR 83 | Temp 97.3°F | Resp 16 | Wt 143.8 lb

## 2023-05-15 VITALS — BP 132/65 | HR 72 | Resp 16

## 2023-05-15 DIAGNOSIS — C719 Malignant neoplasm of brain, unspecified: Secondary | ICD-10-CM | POA: Diagnosis not present

## 2023-05-15 DIAGNOSIS — R112 Nausea with vomiting, unspecified: Secondary | ICD-10-CM | POA: Diagnosis not present

## 2023-05-15 DIAGNOSIS — C714 Malignant neoplasm of occipital lobe: Secondary | ICD-10-CM | POA: Diagnosis not present

## 2023-05-15 MED ORDER — SODIUM CHLORIDE 0.9 % IV SOLN: INTRAVENOUS | Status: DC

## 2023-05-15 MED ORDER — SODIUM CHLORIDE 0.9 % IV SOLN: Freq: Once | INTRAVENOUS | Status: AC

## 2023-05-15 MED ORDER — DEXAMETHASONE SODIUM PHOSPHATE 10 MG/ML IJ SOLN: 10.0000 mg | Freq: Once | INTRAMUSCULAR | Status: DC

## 2023-05-15 MED ORDER — DEXAMETHASONE SODIUM PHOSPHATE 10 MG/ML IJ SOLN
10.0000 mg | Freq: Once | INTRAMUSCULAR | Status: AC
  Administered 2023-05-15: 10 mg via INTRAVENOUS
  Filled 2023-05-15: qty 1

## 2023-05-15 MED ORDER — SODIUM CHLORIDE 0.9 % IV SOLN: 150.0000 mg | Freq: Once | INTRAVENOUS | Status: DC

## 2023-05-15 MED ORDER — SODIUM CHLORIDE 0.9 % IV SOLN
150.0000 mg | Freq: Once | INTRAVENOUS | Status: AC
  Administered 2023-05-15: 150 mg via INTRAVENOUS
  Filled 2023-05-15: qty 150

## 2023-05-15 NOTE — Patient Instructions (Signed)

## 2023-05-15 NOTE — Progress Notes (Signed)
[facility] [address]  [place], [place] [address] [phone]   Interval Evaluation  Date of Service: 05/15/23 Patient Name: Robert Watkins Patient MRN: [id number] Patient DOB: 1985/09/08 Provider: Henreitta Leber, MD  Identifying Statement:  Robert Watkins is a 38 y.o. 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 Barnett Abu in [place]. 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 Lavonna Monarch. Pathology confirms anaplastic astrocytoma.   06/01/2010 Clinical Event-Other  New patient evaluation at [facility]. Recommend radiation therapy with concurrent Temozolomide followed by twelve cycles of five-day Temozolomide.   06/13/2010 - 08/01/2010 Radiation  Radiation with Temodar.  06/13/2010 - 08/01/2010 Chemotherapy  Temodar 75 mg/m2 with radiation.  08/16/2010 - 08/07/2011 Chemotherapy  5 day Temodar 200 mg/m2  02/19/2018 Progression  Progression of non-enhancing tumor compared to 2013. Recommend initiating metronomic daily Temodar 50 mg/m2/day.  02/12/20 Progression Initiates Tibsovo 500mg  daily  10/21/20 Progression Adds oral CCNU 90mg /m2 q6 weeks to daily Tibsovo  08/06/22 Progression Repeat IMRT with concurrent Avastin 10mg /kg IV q2 weeks  11/02/22  Transitions to avastin monotherapy   04/12/23 Progression, initiates vorasidenib+carboplatin AUC4 q3 with avastin   Interval History: Robert Watkins presents today with clinical changes.  He describes worsening nausea and vomiting.  He continues to alternate both zofran and compazine; also utilizing zyprex and decadron daily.  Appetite is very poor, he has been unable to eat  much in the past 4-5 days.  Not doing well with hydration either.  He does complain of further worsening dizziness and frank vertigo, room spinning.  More difficulties with reading, he is no longer able to pay his own bills.  Sleeping up to 20 hours per day.   Prior: Robert Watkins was started on 03/08/23.  Today he describes continuing issues with nausea, he has been dosing zofran twice per day, compazine not at all yet.  Hiccups have been somewhat of a problem lately.  Energy level is decreased as well.  Some subjective imbalance is noted. No headaches or seizures.  He remains otherwise active and functionally independent.     Medications: Current Outpatient Medications on File Prior to Visit  Medication Sig Dispense Refill   atomoxetine (STRATTERA) 40 MG capsule Take 40 mg by mouth daily.     Baclofen 5 MG TABS Take 1 tablet (5 mg total) by mouth 3 (three) times daily as needed. 60 tablet 0   clonazePAM (KLONOPIN) 0.5 MG tablet Take 1 tablet (0.5 mg total) by mouth 2 (two) times daily as needed for anxiety. 60 tablet 0   dexamethasone (DECADRON) 2 MG tablet Take 1 tablet (2 mg total) by mouth daily. 60 tablet 1   lamoTRIgine (LAMICTAL) 100 MG tablet Take 1 tablet by mouth once daily 60 tablet 0   meclizine (ANTIVERT) 25 MG tablet Take 1 tablet (25 mg total) by mouth 3 (three) times daily as needed for dizziness. 30 tablet 0   OLANZapine (ZYPREXA) 5 MG tablet Take 1 tablet (5 mg total) by mouth at bedtime. 30 tablet 2   ondansetron (ZOFRAN) 8 MG tablet Take 1 tablet (8 mg total) by mouth every 6 (six) hours as needed for nausea or vomiting. 60 tablet 2   prochlorperazine (COMPAZINE) 10 MG tablet Take  1 tablet (10 mg total) by mouth every 6 (six) hours as needed for nausea or vomiting. 60 tablet 2   vorasidenib (VORANGIO) 40 MG tablet Take 40 mg by mouth daily. 30 tablet 2   No current facility-administered medications on file prior to visit.    Allergies:  No Active Allergies  Past Medical  History:  Past Medical History:  Diagnosis Date   Cancer of brain (HCC) 04/07/2011   Depression, reactive 04/07/2011   History of chemotherapy 06/13/2010-08/01/2010   42 day Temodar with concurrent Radiation   History of chemotherapy 08/16/2010-08/07/2011   5 day Temodar 200 mg/m2   History of radiation therapy 06/13/2010-08/01/2010   [facility]. Recommend radiation therapy with concurrent Temozolomide followed by twelve cycles of five-day Temozolomide. ?   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 Lavonna Monarch. Pathology confirms anaplastic astrocytoma. ?   CRANIOTOMY FOR STEREOTACTIC GUIDED SURGERY Left 05/12/2010   Stereotactic biopsy of left occipital lesion by Dr. Barnett Abu in [place]. Pathology demonstrates anaplastic astrocytoma (WHO III)?   Social History:  Social History   Socioeconomic History   Marital status: Single    Spouse name: Not on file   Number of children: Not on file   Years of education: Not on file   Highest education level: Not on file  Occupational History   Not on file  Tobacco Use   Smoking status: Former   Smokeless tobacco: Never  Substance and Sexual Activity   Alcohol use: Yes    Alcohol/week: 4.0 standard drinks of alcohol    Types: 4 Shots of liquor per week   Drug use: Yes    Types: Marijuana   Sexual activity: Yes  Other Topics Concern   Not on file  Social History Narrative   Not on file   Social Drivers of Health   Financial Resource Strain: Not on file  Food Insecurity: No Food Insecurity (08/21/2022)   Hunger Vital Sign    Worried About Running Out of Food in the Last Year: Never true    Ran Out of Food in the Last Year: Never true  Transportation Needs: No  Transportation Needs (08/21/2022)   PRAPARE - Administrator, Civil Service (Medical): No    Lack of Transportation (Non-Medical): No  Physical Activity: Not on file  Stress: Not on file  Social Connections: Not on file  Intimate Partner Violence: Not At Risk (08/21/2022)   Humiliation, Afraid, Rape, and Kick questionnaire    Fear of Current or Ex-Partner: No    Emotionally Abused: No    Physically Abused: No    Sexually Abused: No   Family History: No family history on file.  Review of Systems: Constitutional: Denies fevers, chills or abnormal weight loss Eyes: Denies blurriness of vision Ears, nose, mouth, throat, and face: Denies mucositis or sore throat Respiratory: Denies cough, dyspnea or wheezes Cardiovascular: Denies palpitation, chest discomfort or lower extremity swelling Gastrointestinal:  Denies nausea, constipation, diarrhea GU: Denies dysuria or incontinence Skin: Denies abnormal skin rashes Neurological: Per HPI Musculoskeletal: Denies joint pain, back or neck discomfort. No decrease in ROM Behavioral/Psych: Denies anxiety, disturbance in thought content, and mood instability  Physical Exam: Vitals:   05/15/23 0912  BP: 130/89  Pulse: 83  Resp: 16  Temp: (!) 97.3 F (36.3 C)  SpO2: 99%   KPS: 60. General: Alert, cooperative, pleasant, in no acute distress Head: Normal EENT: No conjunctival injection or scleral icterus. Oral mucosa moist Lungs: Resp effort normal Cardiac: Regular rate and rhythm Abdomen: Soft, non-distended abdomen Skin: No rashes cyanosis or petechiae. Extremities: No clubbing or edema  Neurologic Exam: Mental Status: Awake, alert, attentive to examiner. Oriented to self and environment. Language is impaired with regards to comprehension.  Age advanced psychomotor slowing. Cranial Nerves: Visual acuity is grossly normal. Visual fields are full. Extra-ocular movements intact. No ptosis. Face is symmetric, tongue  midline. Motor: Tone and bulk are normal. Power is 4+/5 in right arm. Reflexes are symmetric, no pathologic reflexes present. Intact finger to nose bilaterally Sensory: Intact to light touch and temperature Gait: Mild dystaxia   Labs: I have reviewed the data as listed    Component Value Date/Time   NA 143 05/03/2023 1332   K 3.7 05/03/2023 1332   CL 108 05/03/2023 1332   CO2 31 05/03/2023 1332   GLUCOSE 97 05/03/2023 1332   BUN 18 05/03/2023 1332   CREATININE 1.32 (H) 05/03/2023 1332   CALCIUM 9.6 05/03/2023 1332   PROT 6.6 05/03/2023 1332   ALBUMIN 4.4 05/03/2023 1332   AST 29 05/03/2023 1332   ALT 46 (H) 05/03/2023 1332   ALKPHOS 54 05/03/2023 1332   BILITOT 0.5 05/03/2023 1332   GFRNONAA >60 05/03/2023 1332   GFRAA >60 08/19/2018 0854   Lab Results  Component Value Date   WBC 4.2 05/03/2023   NEUTROABS 3.4 05/03/2023   HGB 15.0 05/03/2023   HCT 40.4 05/03/2023   MCV 94.0 05/03/2023   PLT 85 (L) 05/03/2023     Assessment/Plan 1. Anaplastic astrocytoma (HCC)  Robert Watkins presents today with ongoing clinical decline from his progressive astrocytoma.  Today we will move him to symptom management for IV normal saline, IV emend, and decadron.    We will get MRI brain scheduled ASAP so that goals of care can come into clearer focus.  If progression is noted on MRI we will recommend stopping chemotherapy and transitioning to hospice.  For nausea, should con't zyprexa 5mg  HS in addition to alternating zofran and compazine.  Decadron will con't at 2mg  daily.  Ok with Meclizine 25mg  q8 PRN for vertigo symptoms.  Will con't Lamictal 100mg  daily.    May con't Baclofen 5mg  TID PRN for hiccups.  Ok with Klonopin 0.5mg  BID for anxiety.    Will work to get MRI scheduled by later this week, and reach out following the scan.  We appreciate the opportunity to participate in the care of Robert Watkins.    We ask that Robert Watkins return to clinic following MRI.  All  questions were answered. The patient knows to call the clinic with any problems, questions or concerns. No barriers to learning were detected.  I have spent a total of 40 minutes of face-to-face and non-face-to-face time, excluding clinical staff time, preparing to see patient, ordering tests and/or medications, counseling the patient, and independently interpreting results and communicating results to the patient/family/caregiver    Henreitta Leber, MD Medical Director of Neuro-Oncology [facility] 05/15/23 9:08 AM

## 2023-05-16 ENCOUNTER — Inpatient Hospital Stay: Payer: Managed Care, Other (non HMO) | Admitting: Licensed Clinical Social Worker

## 2023-05-16 DIAGNOSIS — C719 Malignant neoplasm of brain, unspecified: Secondary | ICD-10-CM

## 2023-05-16 NOTE — Progress Notes (Signed)
[facility] CSW Progress Note  Visual merchandiser  received a call from pt's father inquiring about where to turn in disability paperwork to be completed by the cancer center.  CSW instructed pt's father to speak w/ reception and they will either collect the forms or direct him to [place] and [place]'s office.    CSW to remain available as appropriate to provide support throughout duration of treatment.      Rachel Moulds, LCSW Clinical Social Worker [facility]

## 2023-05-18 ENCOUNTER — Ambulatory Visit (HOSPITAL_COMMUNITY): Payer: Managed Care, Other (non HMO)

## 2023-05-20 ENCOUNTER — Other Ambulatory Visit: Payer: Self-pay

## 2023-05-21 ENCOUNTER — Other Ambulatory Visit (HOSPITAL_COMMUNITY): Payer: Self-pay

## 2023-05-21 ENCOUNTER — Other Ambulatory Visit: Payer: Self-pay

## 2023-05-22 ENCOUNTER — Other Ambulatory Visit: Payer: Self-pay

## 2023-05-22 NOTE — Progress Notes (Signed)
Leave and Disability Forms completed as requested. Fax transmission confirmation received. Copy of forms placed for pick-up by [employer] [place] as requested. No other needs or concerns noted at this time.

## 2023-05-23 ENCOUNTER — Ambulatory Visit
Admission: RE | Admit: 2023-05-23 | Discharge: 2023-05-23 | Disposition: A | Discharge status: Home / Self Care | Payer: Managed Care, Other (non HMO) | Source: Ambulatory Visit | Attending: Internal Medicine | Admitting: Internal Medicine

## 2023-05-23 DIAGNOSIS — C719 Malignant neoplasm of brain, unspecified: Secondary | ICD-10-CM

## 2023-05-23 MED ORDER — GADOPICLENOL 0.5 MMOL/ML IV SOLN
7.0000 mL | Freq: Once | INTRAVENOUS | Status: AC | PRN
Start: 2023-05-23 — End: 2023-05-23
  Administered 2023-05-23: 7 mL via INTRAVENOUS

## 2023-05-23 MED FILL — Fosaprepitant Dimeglumine For IV Infusion 150 MG (Base Eq): INTRAVENOUS | Qty: 5 | Status: AC

## 2023-05-24 ENCOUNTER — Other Ambulatory Visit: Payer: Self-pay | Admitting: *Deleted

## 2023-05-24 ENCOUNTER — Other Ambulatory Visit (HOSPITAL_COMMUNITY): Payer: Self-pay

## 2023-05-24 ENCOUNTER — Inpatient Hospital Stay: Payer: Managed Care, Other (non HMO)

## 2023-05-24 ENCOUNTER — Telehealth: Payer: Self-pay | Admitting: *Deleted

## 2023-05-24 ENCOUNTER — Inpatient Hospital Stay (HOSPITAL_BASED_OUTPATIENT_CLINIC_OR_DEPARTMENT_OTHER): Payer: Managed Care, Other (non HMO) | Admitting: Internal Medicine

## 2023-05-24 VITALS — BP 121/89 | HR 81 | Temp 98.1°F | Resp 17 | Ht 71.0 in | Wt 139.7 lb

## 2023-05-24 DIAGNOSIS — C719 Malignant neoplasm of brain, unspecified: Secondary | ICD-10-CM | POA: Diagnosis not present

## 2023-05-24 DIAGNOSIS — C714 Malignant neoplasm of occipital lobe: Secondary | ICD-10-CM | POA: Diagnosis not present

## 2023-05-24 LAB — CBC WITH DIFFERENTIAL (CANCER CENTER ONLY)
Abs Immature Granulocytes: 0 10*3/uL (ref 0.00–0.07)
Basophils Absolute: 0 10*3/uL (ref 0.0–0.1)
Basophils Relative: 0 %
Eosinophils Absolute: 0 10*3/uL (ref 0.0–0.5)
Eosinophils Relative: 0 %
HCT: 41.4 % (ref 39.0–52.0)
Hemoglobin: 15.2 g/dL (ref 13.0–17.0)
Immature Granulocytes: 0 %
Lymphocytes Relative: 18 %
Lymphs Abs: 0.5 10*3/uL — ABNORMAL LOW (ref 0.7–4.0)
MCH: 35.6 pg — ABNORMAL HIGH (ref 26.0–34.0)
MCHC: 36.7 g/dL — ABNORMAL HIGH (ref 30.0–36.0)
MCV: 97 fL (ref 80.0–100.0)
Monocytes Absolute: 0.2 10*3/uL (ref 0.1–1.0)
Monocytes Relative: 6 %
Neutro Abs: 2.1 10*3/uL (ref 1.7–7.7)
Neutrophils Relative %: 76 %
Platelet Count: 85 10*3/uL — ABNORMAL LOW (ref 150–400)
RBC: 4.27 MIL/uL (ref 4.22–5.81)
RDW: 14.9 % (ref 11.5–15.5)
WBC Count: 2.8 10*3/uL — ABNORMAL LOW (ref 4.0–10.5)
nRBC: 0 % (ref 0.0–0.2)

## 2023-05-24 LAB — CMP (CANCER CENTER ONLY)
ALT: 28 U/L (ref 0–44)
AST: 20 U/L (ref 15–41)
Albumin: 4.7 g/dL (ref 3.5–5.0)
Alkaline Phosphatase: 63 U/L (ref 38–126)
Anion gap: 6 (ref 5–15)
BUN: 20 mg/dL (ref 6–20)
CO2: 28 mmol/L (ref 22–32)
Calcium: 9.7 mg/dL (ref 8.9–10.3)
Chloride: 107 mmol/L (ref 98–111)
Creatinine: 1.21 mg/dL (ref 0.61–1.24)
GFR, Estimated: >60 mL/min (ref >60)
Glucose, Bld: 97 mg/dL (ref 70–99)
Potassium: 4.2 mmol/L (ref 3.5–5.1)
Sodium: 141 mmol/L (ref 135–145)
Total Bilirubin: 0.9 mg/dL (ref 0.0–1.2)
Total Protein: 7.1 g/dL (ref 6.5–8.1)

## 2023-05-24 LAB — TOTAL PROTEIN, URINE DIPSTICK: Protein, ur: NEGATIVE mg/dL

## 2023-05-24 MED ORDER — SODIUM CHLORIDE 0.9 % IV SOLN
INTRAVENOUS | Status: AC
Start: 2023-05-24 — End: 2023-05-24

## 2023-05-24 MED ORDER — OLANZAPINE 5 MG PO TABS
5.0000 mg | ORAL_TABLET | Freq: Every day | ORAL | 2 refills | Status: DC
  Filled 2023-05-24: qty 30, 30d supply, fill #0

## 2023-05-24 NOTE — Patient Instructions (Signed)

## 2023-05-24 NOTE — Telephone Encounter (Signed)
Noralee Space RN with [facility] contacted regarding hospice referral.  She will be contacting the patient & his parents.

## 2023-05-24 NOTE — Progress Notes (Signed)
[facility] [address]  [place], [place] [address] [phone]   Interval Evaluation  Date of Service: 05/24/23 Patient Name: Robert Watkins Patient MRN: [id number] Patient DOB: October 19, 1985 Provider: Henreitta Leber, MD  Identifying Statement:  Robert Watkins is a 38 y.o. 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 Barnett Abu in [place]. 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 Lavonna Monarch. Pathology confirms anaplastic astrocytoma.   06/01/2010 Clinical Event-Other  New patient evaluation at [facility]. Recommend radiation therapy with concurrent Temozolomide followed by twelve cycles of five-day Temozolomide.   06/13/2010 - 08/01/2010 Radiation  Radiation with Temodar.  06/13/2010 - 08/01/2010 Chemotherapy  Temodar 75 mg/m2 with radiation.  08/16/2010 - 08/07/2011 Chemotherapy  5 day Temodar 200 mg/m2  02/19/2018 Progression  Progression of non-enhancing tumor compared to 2013. Recommend initiating metronomic daily Temodar 50 mg/m2/day.  02/12/20 Progression Initiates Tibsovo 500mg  daily  10/21/20 Progression Adds oral CCNU 90mg /m2 q6 weeks to daily Tibsovo  08/06/22 Progression Repeat IMRT with concurrent Avastin 10mg /kg IV q2 weeks  11/02/22  Transitions to avastin monotherapy   04/12/23 Progression, initiates vorasidenib+carboplatin AUC4 q3 with avastin   Interval History: Robert Watkins presents today following MRI brain.  He describes continued worsening nausea and vomiting, though some days have been ok.  He continues to alternate both zofran and compazine; also utilizing zyprex and decadron daily.  He does  complain of further worsening dizziness and frank vertigo, room spinning.  He is unable to walk on his own without holding on to family members. Sleeping up to 20 hours per day.   Prior: [name] was started on 03/08/23.  Today he describes continuing issues with nausea, he has been dosing zofran twice per day, compazine not at all yet.  Hiccups have been somewhat of a problem lately.  Energy level is decreased as well.  Some subjective imbalance is noted. No headaches or seizures.  He remains otherwise active and functionally independent.     Medications: Current Outpatient Medications on File Prior to Visit  Medication Sig Dispense Refill   dexamethasone (DECADRON) 2 MG tablet Take 1 tablet (2 mg total) by mouth daily. 60 tablet 1   lamoTRIgine (LAMICTAL) 100 MG tablet Take 1 tablet by mouth once daily 60 tablet 0   meclizine (ANTIVERT) 25 MG tablet Take 1 tablet (25 mg total) by mouth 3 (three) times daily as needed for dizziness. 30 tablet 0   ondansetron (ZOFRAN) 8 MG tablet Take 1 tablet (8 mg total) by mouth every 6 (six) hours as needed for nausea or vomiting. 60 tablet 2   prochlorperazine (COMPAZINE) 10 MG tablet Take 1 tablet (10 mg total) by mouth every 6 (six) hours as needed for nausea or vomiting. 60 tablet 2   No current facility-administered medications on file prior to visit.    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 Recommend radiation therapy with concurrent Temozolomide followed by twelve cycles of five-day Temozolomide. ?   Seizure disorder, secondary (HCC) 04/07/2011    Past Surgical History:  Past Surgical History:  Procedure Laterality Date   CRANIECTOMY / CRANIOTOMY FOR EXCISION OF BRAIN TUMOR Left 05/26/2010   Left parietooccipital craniotomy with tumor resection by Dr. Lavonna Monarch. Pathology confirms anaplastic astrocytoma. ?   CRANIOTOMY FOR STEREOTACTIC GUIDED SURGERY Left 05/12/2010   Stereotactic biopsy of left occipital lesion by Dr. Barnett Abu in [place]. Pathology demonstrates anaplastic astrocytoma (WHO III)?   Social History:  Social History   Socioeconomic History   Marital status: Single    Spouse name: Not on file   Number of children: Not on file   Years of education: Not on file   Highest education level: Not on file  Occupational History   Not on file  Tobacco Use   Smoking status: Former   Smokeless tobacco: Never  Substance and Sexual Activity   Alcohol use: Yes    Alcohol/week: 4.0 standard drinks of alcohol    Types: 4 Shots of liquor per week   Drug use: Yes    Types: Marijuana   Sexual activity: Yes  Other Topics Concern   Not on file  Social History Narrative   Not on file   Social Drivers of Health   Financial Resource Strain: Not on file  Food Insecurity: No Food Insecurity (08/21/2022)   Hunger Vital Sign    Worried About Running Out of Food in the Last Year: Never true    Ran Out of Food in the Last Year: Never true  Transportation Needs: No Transportation Needs (08/21/2022)   PRAPARE - Administrator, Civil Service (Medical): No    Lack of Transportation (Non-Medical): No  Physical Activity: Not on file  Stress: Not on file  Social Connections: Not on file  Intimate Partner Violence: Not At Risk (08/21/2022)   Humiliation, Afraid, Rape, and Kick questionnaire    Fear of Current or Ex-Partner: No    Emotionally Abused: No    Physically Abused: No    Sexually Abused: No   Family History: No family history on file.  Review of Systems: Constitutional: Denies fevers, chills or  abnormal weight loss Eyes: Denies blurriness of vision Ears, nose, mouth, throat, and face: Denies mucositis or sore throat Respiratory: Denies cough, dyspnea or wheezes Cardiovascular: Denies palpitation, chest discomfort or lower extremity swelling Gastrointestinal:  Denies nausea, constipation, diarrhea GU: Denies dysuria or incontinence Skin: Denies abnormal skin rashes Neurological: Per HPI Musculoskeletal: Denies joint pain, back or neck discomfort. No decrease in ROM Behavioral/Psych: Denies anxiety, disturbance in thought content, and mood instability  Physical Exam: Vitals:   05/24/23 1357  BP: 121/89  Pulse: 81  Resp: 17  Temp: 98.1 F (36.7 C)  SpO2: 100%   KPS: 60. General: Alert, cooperative, pleasant, in no acute distress Head: Normal EENT: No conjunctival injection or scleral icterus. Oral mucosa moist Lungs: Resp effort normal Cardiac: Regular rate and rhythm Abdomen: Soft, non-distended abdomen Skin: No rashes cyanosis or petechiae. Extremities: No clubbing or edema  Neurologic Exam: Mental Status: Awake, alert, attentive to examiner. Oriented to self and environment. Language is impaired with regards to comprehension.  Age advanced psychomotor slowing. Cranial Nerves: Visual acuity is grossly normal. Visual fields are full. Extra-ocular movements intact. No ptosis. Face is symmetric, tongue midline. Motor: Tone and bulk are normal. Power is 4+/5 in right arm. Reflexes  are symmetric, no pathologic reflexes present. Intact finger to nose bilaterally Sensory: Intact to light touch and temperature Gait: Mild dystaxia   Labs: I have reviewed the data as listed    Component Value Date/Time   NA 141 05/24/2023 1333   K 4.2 05/24/2023 1333   CL 107 05/24/2023 1333   CO2 28 05/24/2023 1333   GLUCOSE 97 05/24/2023 1333   BUN 20 05/24/2023 1333   CREATININE 1.21 05/24/2023 1333   CALCIUM 9.7 05/24/2023 1333   PROT 7.1 05/24/2023 1333   ALBUMIN 4.7  05/24/2023 1333   AST 20 05/24/2023 1333   ALT 28 05/24/2023 1333   ALKPHOS 63 05/24/2023 1333   BILITOT 0.9 05/24/2023 1333   GFRNONAA >60 05/24/2023 1333   GFRAA >60 08/19/2018 0854   Lab Results  Component Value Date   WBC 2.8 (L) 05/24/2023   NEUTROABS 2.1 05/24/2023   HGB 15.2 05/24/2023   HCT 41.4 05/24/2023   MCV 97.0 05/24/2023   PLT 85 (L) 05/24/2023    Imaging:  [facility] Clinician Interpretation: I have personally reviewed the CNS images as listed.  My interpretation, in the context of the patient's clinical presentation, is progressive disease  MR BRAIN W WO CONTRAST Result Date: 05/23/2023 CLINICAL DATA:  Brain/CNS neoplasm, assess treatment response. Anaplastic astrocytoma. EXAM: MRI HEAD WITHOUT AND WITH CONTRAST TECHNIQUE: Multiplanar, multiecho pulse sequences of the brain and surrounding structures were obtained without and with intravenous contrast. CONTRAST:  7 mL Vueway COMPARISON:  Head MRI 04/01/2023 FINDINGS: Brain: Extensive confluent T2 FLAIR hyperintensity is present throughout the left occipital, temporal, and parietal lobes as well as extending into the thalami, basal ganglia, and deep white matter tracts in this region. There is extensive involvement of the entirety of the corpus callosum with extension into the medial aspects of the right cerebral hemisphere and into the anterior left frontal lobe. There is associated extensive nodular enhancement including along the ependymal surface of the left greater than right lateral ventricles and along the septum pellucidum/fornices. Numerous foci of chronic microhemorrhage in these areas have increased in number. There is also extensive T2 hyperintensity with associated enhancement extending inferiorly in the left midbrain and pons and into the central and inferior aspects of the left greater than right cerebellar hemispheres and peduncles. There is also extensive involvement of the optic chiasm, prechiasmatic optic nerves,  and optic radiations. Overall, findings have greatly progressed from the prior MRI. There is also new leptomeningeal enhancement over the parasagittal left parietal lobe (series 13, image 126). Progressive ependymal enhancement is also noted along the fourth ventricle. There is minimal rightward midline shift. No acute infarct, hydrocephalus, or extra-axial fluid collection is evident. Vascular: Major intracranial vascular flow voids are preserved. Skull and upper cervical spine: Left parieto-occipital craniotomy. Sinuses/Orbits: Unremarkable orbits. Paranasal sinuses and mastoid air cells are clear. Other: None. IMPRESSION: Marked worsening of supratentorial and infratentorial tumor. Electronically Signed   By: Sebastian Ache M.D.   On: 05/23/2023 15:41    Assessment/Plan 1. Anaplastic astrocytoma (HCC)  Dejion Grillo presents today with ongoing clinical decline from his progressive astrocytoma.  MRI brain demonstrates fulminant progression of disease.    We reviewed goals of care in detail; they are agreeable with transition to hospice to focus of quality and comfort.  We will give 1L normal saline today in infusion for hydration, no systemic therapy.  For nausea, should con't zyprexa 5mg  HS in addition to alternating zofran and compazine.  Decadron will con't at 2mg  daily.  Ok with Meclizine 25mg  q8 PRN for vertigo symptoms.  Will con't Lamictal 100mg  daily.    We appreciate the opportunity to participate in the care of Adrik Khim.     All questions were answered. The patient knows to call the clinic with any problems, questions or concerns. No barriers to learning were detected.  I have spent a total of 40 minutes of face-to-face and non-face-to-face time, excluding clinical staff time, preparing to see patient, ordering tests and/or medications, counseling the patient, and independently interpreting results and communicating results to the patient/family/caregiver    Henreitta Leber,  MD Medical Director of Neuro-Oncology [facility] at [facility] 05/24/23 2:40 PM

## 2023-05-25 ENCOUNTER — Other Ambulatory Visit: Payer: Self-pay

## 2023-05-25 ENCOUNTER — Other Ambulatory Visit (HOSPITAL_COMMUNITY): Payer: Self-pay

## 2023-05-25 ENCOUNTER — Other Ambulatory Visit: Payer: Self-pay | Admitting: Internal Medicine

## 2023-05-25 MED ORDER — OXYCODONE HCL ER 10 MG PO T12A
10.0000 mg | EXTENDED_RELEASE_TABLET | Freq: Two times a day (BID) | ORAL | 0 refills | Status: DC
  Filled 2023-05-25: qty 60, 30d supply, fill #0

## 2023-05-25 NOTE — Progress Notes (Signed)
Voranigo has been discontinued and patient will be transitioned to hospice. Patient has been disenrolled from [employer].

## 2023-05-29 ENCOUNTER — Telehealth: Payer: Self-pay | Admitting: *Deleted

## 2023-05-29 NOTE — Telephone Encounter (Signed)
[facility] Certification & Plan of Care and orders signed by Dr Barbaraann Cao & returned by fax, [phone].  Fax confirmation received.

## 2023-05-31 ENCOUNTER — Encounter: Payer: Self-pay | Admitting: Internal Medicine

## 2023-05-31 ENCOUNTER — Other Ambulatory Visit (HOSPITAL_COMMUNITY): Payer: Self-pay

## 2023-05-31 MED ORDER — LORAZEPAM 0.5 MG PO TABS
0.5000 mg | ORAL_TABLET | Freq: Four times a day (QID) | ORAL | 2 refills | Status: DC | PRN
  Filled 2023-05-31: qty 80, 20d supply, fill #0

## 2023-05-31 MED ORDER — DOCUSATE SODIUM 100 MG PO CAPS
100.0000 mg | ORAL_CAPSULE | Freq: Every day | ORAL | 2 refills | Status: DC | PRN
  Filled 2023-05-31: qty 30, 30d supply, fill #0

## 2023-05-31 MED ORDER — SENNOSIDES 8.6 MG PO TABS
1.0000 | ORAL_TABLET | Freq: Two times a day (BID) | ORAL | 2 refills | Status: DC | PRN
  Filled 2023-05-31: qty 60, 30d supply, fill #0

## 2023-06-04 ENCOUNTER — Other Ambulatory Visit (HOSPITAL_COMMUNITY): Payer: Self-pay

## 2023-06-04 ENCOUNTER — Other Ambulatory Visit (HOSPITAL_BASED_OUTPATIENT_CLINIC_OR_DEPARTMENT_OTHER): Payer: Self-pay

## 2023-06-04 MED ORDER — QUETIAPINE FUMARATE 50 MG PO TABS
50.0000 mg | ORAL_TABLET | Freq: Four times a day (QID) | ORAL | 2 refills | Status: DC | PRN
  Filled 2023-06-04: qty 56, 14d supply, fill #0

## 2023-06-07 ENCOUNTER — Telehealth: Payer: Self-pay | Admitting: *Deleted

## 2023-06-07 ENCOUNTER — Other Ambulatory Visit (HOSPITAL_COMMUNITY): Payer: Self-pay

## 2023-06-07 MED ORDER — MORPHINE SULFATE (CONCENTRATE) 10 MG /0.5 ML PO SOLN
5.0000 mg | ORAL | 0 refills | Status: DC | PRN
  Filled 2023-06-07: qty 30, 20d supply, fill #0

## 2023-06-07 NOTE — Telephone Encounter (Signed)
Physician order signed by Dr Barbaraann Cao & faxed to [facility], [phone].  Fax confirmation received.

## 2023-06-08 ENCOUNTER — Other Ambulatory Visit (HOSPITAL_COMMUNITY): Payer: Self-pay

## 2023-06-08 MED ORDER — ATROPINE SULFATE 1 % OP SOLN
OPHTHALMIC | 0 refills | Status: DC
  Filled 2023-06-08: qty 6, 5d supply, fill #0

## 2023-06-23 DEATH — deceased
